# Patient Record
Sex: Female | Born: 1960 | Race: White | Hispanic: No | Marital: Married | Smoking: Never smoker
Health system: Southern US, Community
[De-identification: ages and names within clinical notes are randomized; demographics above are authoritative.]

## PROBLEM LIST (undated history)

## (undated) DIAGNOSIS — G4730 Sleep apnea, unspecified: Secondary | ICD-10-CM

## (undated) DIAGNOSIS — T7840XA Allergy, unspecified, initial encounter: Secondary | ICD-10-CM

## (undated) DIAGNOSIS — F32A Depression, unspecified: Secondary | ICD-10-CM

## (undated) DIAGNOSIS — K589 Irritable bowel syndrome without diarrhea: Secondary | ICD-10-CM

## (undated) DIAGNOSIS — E669 Obesity, unspecified: Secondary | ICD-10-CM

## (undated) DIAGNOSIS — F419 Anxiety disorder, unspecified: Secondary | ICD-10-CM

## (undated) DIAGNOSIS — K219 Gastro-esophageal reflux disease without esophagitis: Secondary | ICD-10-CM

## (undated) HISTORY — DX: Gastro-esophageal reflux disease without esophagitis: K21.9

## (undated) HISTORY — DX: Irritable bowel syndrome, unspecified: K58.9

## (undated) HISTORY — DX: Allergy, unspecified, initial encounter: T78.40XA

## (undated) HISTORY — PX: ABDOMINAL HYSTERECTOMY: SHX81

## (undated) HISTORY — DX: Obesity, unspecified: E66.9

---

## 1990-07-19 HISTORY — PX: BACK SURGERY: SHX140

## 2011-05-08 ENCOUNTER — Emergency Department: Payer: Self-pay | Admitting: *Deleted

## 2011-05-08 DIAGNOSIS — Z87442 Personal history of urinary calculi: Secondary | ICD-10-CM | POA: Insufficient documentation

## 2012-04-27 ENCOUNTER — Ambulatory Visit: Payer: Self-pay | Admitting: Orthopedic Surgery

## 2012-05-09 ENCOUNTER — Ambulatory Visit: Payer: Self-pay | Admitting: Anesthesiology

## 2012-05-16 ENCOUNTER — Ambulatory Visit: Payer: Self-pay

## 2012-05-17 ENCOUNTER — Encounter: Payer: Self-pay | Admitting: Anesthesiology

## 2012-05-19 ENCOUNTER — Encounter: Payer: Self-pay | Admitting: Anesthesiology

## 2012-05-29 ENCOUNTER — Ambulatory Visit: Payer: Self-pay | Admitting: Anesthesiology

## 2012-06-18 ENCOUNTER — Encounter: Payer: Self-pay | Admitting: Anesthesiology

## 2012-07-03 ENCOUNTER — Ambulatory Visit: Payer: Self-pay | Admitting: Anesthesiology

## 2014-10-28 ENCOUNTER — Ambulatory Visit
Admit: 2014-10-28 | Disposition: A | Discharge status: Home / Self Care | Payer: Self-pay | Attending: Family Medicine | Admitting: Family Medicine

## 2014-11-04 ENCOUNTER — Ambulatory Visit (INDEPENDENT_AMBULATORY_CARE_PROVIDER_SITE_OTHER): Payer: BC Managed Care – PPO

## 2014-11-04 ENCOUNTER — Ambulatory Visit (INDEPENDENT_AMBULATORY_CARE_PROVIDER_SITE_OTHER): Payer: BC Managed Care – PPO | Admitting: Podiatry

## 2014-11-04 ENCOUNTER — Encounter: Payer: Self-pay | Admitting: Podiatry

## 2014-11-04 VITALS — BP 150/92 | HR 82 | Resp 16 | Ht 70.0 in | Wt 195.0 lb

## 2014-11-04 DIAGNOSIS — M779 Enthesopathy, unspecified: Secondary | ICD-10-CM

## 2014-11-04 DIAGNOSIS — M778 Other enthesopathies, not elsewhere classified: Secondary | ICD-10-CM

## 2014-11-04 DIAGNOSIS — M79674 Pain in right toe(s): Secondary | ICD-10-CM | POA: Diagnosis not present

## 2014-11-04 DIAGNOSIS — M7751 Other enthesopathy of right foot: Secondary | ICD-10-CM

## 2014-11-04 NOTE — Progress Notes (Signed)
   Subjective:    Patient ID: Wendee CoppAngela Dutter, female    DOB: 12/22/1960, 54 y.o.   MRN: [id number]  HPI  RIGHT FOOT PAIN , THE BALL OF FOOT AND THE TWO TOES, SECOND AND THIRD TOE, IT THROBS , IT HAS BEEN ABOUT 6 MONTHS   Review of Systems  All other systems reviewed and are negative.      Objective:   Physical Exam: I have reviewed her past medical history medications allergies surgery social history and review of systems. His are palpable bilateral. Neurologic and muscle strength are normal bilateral. Deep tendon reflexes are bilaterally equal. Orthopedic evaluation demonstrates pain on palpation and in range of motion of the second and third metatarsophalangeal joints of the right foot with mild hammertoe deformity third right. Radiographs confirm no major osseous abnormalities in this area.        Assessment & Plan:  Assessment: Capsulitis second and third metatarsophalangeal joints right foot.  Plan: Injected para-articular Kenalog and local anesthetic second and third metatarsophalangeal joints right foot. Discussed appropriate shoe gear stretching exercises ice therapy and shoe modifications.

## 2014-12-20 ENCOUNTER — Other Ambulatory Visit: Payer: Self-pay

## 2014-12-20 DIAGNOSIS — E663 Overweight: Secondary | ICD-10-CM | POA: Insufficient documentation

## 2014-12-20 DIAGNOSIS — K219 Gastro-esophageal reflux disease without esophagitis: Secondary | ICD-10-CM | POA: Insufficient documentation

## 2014-12-20 DIAGNOSIS — N951 Menopausal and female climacteric states: Secondary | ICD-10-CM | POA: Insufficient documentation

## 2014-12-20 DIAGNOSIS — E669 Obesity, unspecified: Secondary | ICD-10-CM | POA: Insufficient documentation

## 2014-12-20 MED ORDER — VENLAFAXINE HCL ER 150 MG PO TB24: 1.0000 | ORAL_TABLET | Freq: Every day | ORAL | Status: DC

## 2015-01-02 ENCOUNTER — Other Ambulatory Visit: Payer: Self-pay

## 2015-01-02 NOTE — Telephone Encounter (Signed)
Called [facility] and patient had refill in system.

## 2015-04-11 ENCOUNTER — Encounter: Payer: Self-pay | Admitting: Family Medicine

## 2015-04-17 ENCOUNTER — Other Ambulatory Visit: Payer: Self-pay | Admitting: Family Medicine

## 2015-04-19 ENCOUNTER — Other Ambulatory Visit: Payer: Self-pay | Admitting: Family Medicine

## 2015-04-21 ENCOUNTER — Telehealth: Payer: Self-pay | Admitting: Family Medicine

## 2015-04-21 DIAGNOSIS — N951 Menopausal and female climacteric states: Secondary | ICD-10-CM

## 2015-04-21 MED ORDER — VENLAFAXINE HCL ER 150 MG PO TB24: 1.0000 | ORAL_TABLET | Freq: Every day | ORAL | Status: DC

## 2015-04-21 NOTE — Telephone Encounter (Signed)
Routed to Dr. Shah for approval 

## 2015-04-21 NOTE — Telephone Encounter (Signed)
Patient has scheduled appointment for 04-23-15 and would like to know if you could give her just enough of effexor to last until appointment time. Please send to [facility]

## 2015-04-21 NOTE — Telephone Encounter (Signed)
Prescription for Effexor for 30 days is sent to patient's pharmacy

## 2015-04-22 ENCOUNTER — Other Ambulatory Visit: Payer: Self-pay | Admitting: Family Medicine

## 2015-04-22 DIAGNOSIS — Z1231 Encounter for screening mammogram for malignant neoplasm of breast: Secondary | ICD-10-CM

## 2015-04-22 NOTE — Telephone Encounter (Signed)
A 30 - day supply of Effexor has been sent to pharmacy per Dr. Sherryll Burger

## 2015-04-23 ENCOUNTER — Ambulatory Visit (INDEPENDENT_AMBULATORY_CARE_PROVIDER_SITE_OTHER): Payer: BC Managed Care – PPO | Admitting: Family Medicine

## 2015-04-23 ENCOUNTER — Encounter: Payer: Self-pay | Admitting: Family Medicine

## 2015-04-23 ENCOUNTER — Ambulatory Visit
Admission: RE | Admit: 2015-04-23 | Discharge: 2015-04-23 | Disposition: A | Discharge status: Home / Self Care | Payer: BC Managed Care – PPO | Source: Ambulatory Visit | Attending: Family Medicine | Admitting: Family Medicine

## 2015-04-23 VITALS — BP 120/78 | HR 88 | Temp 98.1°F | Resp 18 | Ht 68.0 in | Wt 244.1 lb

## 2015-04-23 DIAGNOSIS — Z1231 Encounter for screening mammogram for malignant neoplasm of breast: Secondary | ICD-10-CM | POA: Insufficient documentation

## 2015-04-23 DIAGNOSIS — N951 Menopausal and female climacteric states: Secondary | ICD-10-CM

## 2015-04-23 MED ORDER — VENLAFAXINE HCL ER 37.5 MG PO TB24: 1.0000 | ORAL_TABLET | Freq: Every day | ORAL | Status: DC

## 2015-04-23 MED ORDER — VENLAFAXINE HCL ER 75 MG PO TB24: 1.0000 | ORAL_TABLET | Freq: Every day | ORAL | Status: DC

## 2015-04-23 NOTE — Progress Notes (Signed)
Name: Ashley Hale   MRN: [id number]    DOB: 02-12-1961   Date:04/23/2015       Progress Note  Subjective  Chief Complaint  Chief Complaint  Patient presents with  . Medication Refill    venlafaxine  / furosemide     HPI  Hot Flashes  Pt. is here for follow up of hot flashes and menopausal symptoms, including night sweats. She is on Effexor XR 150 mg daily. Initially had good symptom control on Effexor but is not working any more. Hot flashes and night sweats have recurred. 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 She wishes to stop Effexor and start on a different therapy.   History reviewed. No pertinent past medical history.  Past Surgical History  Procedure Laterality Date  . Abdominal hysterectomy      History reviewed. No pertinent family history.  Social History   Social History  . Marital Status: Married    Spouse Name: N/A  . Number of Children: N/A  . Years of Education: N/A   Occupational History  . Not on file.   Social History Main Topics  . Smoking status: Never Smoker   . Smokeless tobacco: Not on file  . Alcohol Use: Not on file  . Drug Use: Not on file  . Sexual Activity: Not on file   Other Topics Concern  . Not on file   Social History Narrative    Current outpatient prescriptions:  .  furosemide (LASIX) 40 MG tablet, Take 1 tablet by mouth daily., Disp: , Rfl:  .  Omeprazole 20 MG TBEC, Take 1 capsule by mouth daily., Disp: , Rfl:  .  Venlafaxine HCl 150 MG TB24, Take 1 tablet (150 mg total) by mouth daily., Disp: 30 each, Rfl: 0  Allergies  Allergen Reactions  . Sulfa Antibiotics Hives    STOMACH CRAMPS   Review of Systems  Constitutional: Negative for fever, chills and malaise/fatigue.  Psychiatric/Behavioral: Negative for depression. The patient is not nervous/anxious and does not have insomnia.    Objective  Filed Vitals:   04/23/15 1532  BP: 120/78  Pulse: 88  Temp: 98.1 F (36.7 C)   TempSrc: Oral  Resp: 18  Height:  (1.727 m)  Weight: 244 lb 1.6 oz (110.723 kg)  SpO2: 98%   Physical Exam  Constitutional: She is well-developed, well-nourished, and in no distress.  Cardiovascular: Normal rate and regular rhythm.   Pulmonary/Chest: Effort normal and breath sounds normal.  Nursing note and vitals reviewed.  Assessment & Plan  1. Hot flash, menopausal DC venlafaxine via  Low taper over 2 weeks. Return to discuss alternative therapy for hot flashes. - Venlafaxine HCl 75 MG TB24; Take 1 tablet (75 mg total) by mouth daily.  Dispense: 7 each; Refill: 0 - Venlafaxine HCl 37.5 MG TB24; Take 1 tablet (37.5 mg total) by mouth daily.  Dispense: 7 tablet; Refill: 0   Irais Mottram Asad [facility] 04/23/2015 4:22 PM

## 2015-05-05 ENCOUNTER — Encounter: Payer: Self-pay | Admitting: Family Medicine

## 2015-05-05 ENCOUNTER — Ambulatory Visit (INDEPENDENT_AMBULATORY_CARE_PROVIDER_SITE_OTHER): Payer: BC Managed Care – PPO | Admitting: Family Medicine

## 2015-05-05 VITALS — BP 136/80 | HR 86 | Temp 98.5°F | Resp 18 | Ht 68.0 in | Wt 246.1 lb

## 2015-05-05 DIAGNOSIS — E669 Obesity, unspecified: Secondary | ICD-10-CM

## 2015-05-05 DIAGNOSIS — N951 Menopausal and female climacteric states: Secondary | ICD-10-CM

## 2015-05-05 MED ORDER — GABAPENTIN 300 MG PO CAPS: 300.0000 mg | ORAL_CAPSULE | Freq: Three times a day (TID) | ORAL | Status: DC

## 2015-05-05 MED ORDER — LORCASERIN HCL 10 MG PO TABS: 1.0000 | ORAL_TABLET | Freq: Two times a day (BID) | ORAL | Status: DC

## 2015-05-05 NOTE — Progress Notes (Signed)
Name: Ashley Hale   MRN: [id number]    DOB: 09/21/1960   Date:05/05/2015       Progress Note  Subjective  Chief Complaint  Chief Complaint  Patient presents with  . Follow-up    2 wk    HPI  Pt. Is here to be started on therapy for Hot Flashes and night sweats due to menopause. She was on Venlafaxine 150 mg once daily which was not working for her symptoms. She has tapered off Venlafaxine.   In addition, patient is requesting pharmacotherapy for obesity. Current BMI is 37.42 and she weighs 246 pounds today.she will be started on Belviq 10 MG twice a day.  History reviewed. No pertinent past medical history.  Past Surgical History  Procedure Laterality Date  . Abdominal hysterectomy      History reviewed. No pertinent family history.  Social History   Social History  . Marital Status: Married    Spouse Name: N/A  . Number of Children: N/A  . Years of Education: N/A   Occupational History  . Not on file.   Social History Main Topics  . Smoking status: Never Smoker   . Smokeless tobacco: Not on file  . Alcohol Use: Not on file  . Drug Use: Not on file  . Sexual Activity: Not on file   Other Topics Concern  . Not on file   Social History Narrative    Current outpatient prescriptions:  .  furosemide (LASIX) 40 MG tablet, Take 1 tablet by mouth daily., Disp: , Rfl:  .  Omeprazole 20 MG TBEC, Take 1 capsule by mouth daily., Disp: , Rfl:  .  Venlafaxine HCl 37.5 MG TB24, Take 1 tablet (37.5 mg total) by mouth daily., Disp: 7 tablet, Rfl: 0 .  Venlafaxine HCl 75 MG TB24, Take 1 tablet (75 mg total) by mouth daily., Disp: 7 each, Rfl: 0  Allergies  Allergen Reactions  . Sulfa Antibiotics Hives    STOMACH CRAMPS   Review of Systems  Constitutional: Positive for malaise/fatigue. Negative for fever, chills and weight loss.  Respiratory: Negative for cough and shortness of breath.   Cardiovascular: Negative for chest pain.  Psychiatric/Behavioral: Positive for  depression. The patient is not nervous/anxious.    Objective  Filed Vitals:   05/05/15 1537  BP: 136/80  Pulse: 86  Temp: 98.5 F (36.9 C)  TempSrc: Oral  Resp: 18  Height: 5\' 8"  (1.727 m)  Weight: 246 lb 1.6 oz (111.63 kg)  SpO2: 97%   Physical Exam  Constitutional: She is oriented to person, place, and time and well-developed, well-nourished, and in no distress.  HENT:  Head: Normocephalic and atraumatic.  Cardiovascular: Normal rate and regular rhythm.   No murmur heard. Pulmonary/Chest: Effort normal and breath sounds normal.  Neurological: She is alert and oriented to person, place, and time.  Psychiatric: Mood, memory, affect and judgment normal.  Nursing note and vitals reviewed.  Assessment & Plan  1. Hot flash, menopausal  Started on gabapentin 300 mg 1 tablet every night and reassess in one month. If therapy with gabapentin fails, we will consider HRT.  - gabapentin (NEURONTIN) 300 MG capsule; Take 1 capsule (300 mg total) by mouth 3 (three) times daily.  Dispense: 90 capsule; Refill: 0  2. Adiposity  Started on Belviq 10 mg twice a day. Encouraged to continue with dietary and lifestyle therapies in parallel with pharmacotherapy.Educated on medication side effects. Reassess in one month.  - Lorcaserin HCl (BELVIQ) 10 MG TABS; Take  1 tablet by mouth 2 (two) times daily.  Dispense: 60 tablet; Refill: 0   Cleatus Gabriel [facility] 05/05/2015 4:02 PM

## 2015-05-07 ENCOUNTER — Ambulatory Visit (INDEPENDENT_AMBULATORY_CARE_PROVIDER_SITE_OTHER): Payer: BC Managed Care – PPO | Admitting: Podiatry

## 2015-05-07 ENCOUNTER — Ambulatory Visit (INDEPENDENT_AMBULATORY_CARE_PROVIDER_SITE_OTHER): Payer: BC Managed Care – PPO

## 2015-05-07 ENCOUNTER — Encounter: Payer: Self-pay | Admitting: Podiatry

## 2015-05-07 VITALS — BP 108/72 | HR 111 | Resp 18

## 2015-05-07 DIAGNOSIS — M778 Other enthesopathies, not elsewhere classified: Secondary | ICD-10-CM

## 2015-05-07 DIAGNOSIS — R52 Pain, unspecified: Secondary | ICD-10-CM

## 2015-05-07 DIAGNOSIS — M779 Enthesopathy, unspecified: Secondary | ICD-10-CM

## 2015-05-07 DIAGNOSIS — M7751 Other enthesopathy of right foot: Secondary | ICD-10-CM

## 2015-05-07 NOTE — Progress Notes (Signed)
She presents today states that she fell and bruised her second digit right foot.  Objective: Vital signs are stable she is alert and oriented 3. Pulses are palpable. She has pain on palpation second metatarsophalangeal joint and in range of motion of the second metatarsophalangeal joint right. Radiograph does not demonstrate type of osseus abnormality.  Assessment: Well healing capsulitis second metatarsophalangeal joint right foot.  Plan: Injected the area today of dexamethasone and local anesthetic follow-up with me in the near future for surgical intervention. She states that she is ready to have this done.

## 2015-05-08 ENCOUNTER — Telehealth: Payer: Self-pay | Admitting: Internal Medicine

## 2015-05-08 ENCOUNTER — Telehealth: Payer: Self-pay | Admitting: Family Medicine

## 2015-05-08 DIAGNOSIS — N951 Menopausal and female climacteric states: Secondary | ICD-10-CM

## 2015-05-08 MED ORDER — PAROXETINE MESYLATE 7.5 MG PO CAPS: 1.0000 | ORAL_CAPSULE | Freq: Every day | ORAL | Status: DC

## 2015-05-08 NOTE — Telephone Encounter (Signed)
ERRENOUS °

## 2015-05-08 NOTE — Telephone Encounter (Signed)
Pt states she has been very sick since being put on the Gabepentin. She has had no energy and has been vomiting,diarrhea,dizzy. She states she has taken herself off of it because she just cannot take it anymore. She also states she is very emotional and Dr Sherryll BurgerShah told her to call if she needs something for anxiety. She states she needs it really bad coming off of the other meds.

## 2015-05-08 NOTE — Telephone Encounter (Signed)
Routed to Dr. Shah for advice  

## 2015-05-08 NOTE — Telephone Encounter (Signed)
Discussed symptoms with patient Ashley Hale DC gabapentin and start patient on paroxetine 7.5 mg daily at bedtime. Patient advised to hold off on Belviq as it may have some interaction with paroxetine. Advised on possible adverse effects. Prescription for 30 days sent to pharmacy. Patient Ashley Hale follow-up in one month..Marland Kitchen

## 2015-05-09 NOTE — Telephone Encounter (Signed)
Dr. Sherryll BurgerShah has spoken with patient and she will follow up in 1 month

## 2015-05-12 ENCOUNTER — Telehealth: Payer: Self-pay | Admitting: Family Medicine

## 2015-05-12 DIAGNOSIS — N951 Menopausal and female climacteric states: Secondary | ICD-10-CM

## 2015-05-12 MED ORDER — PAROXETINE HCL 10 MG PO TABS: 10.0000 mg | ORAL_TABLET | Freq: Every day | ORAL | Status: DC

## 2015-05-12 NOTE — Telephone Encounter (Signed)
DC Paxil 7.5 mg and start on 10 mg daily. Follow-up in one month.

## 2015-05-12 NOTE — Telephone Encounter (Signed)
Was prescribed paxail 7.5mg  was $65 but is able to get the generic 10mg  for $10 but [facility]

## 2015-06-04 ENCOUNTER — Telehealth: Payer: Self-pay | Admitting: Family Medicine

## 2015-06-04 DIAGNOSIS — N951 Menopausal and female climacteric states: Secondary | ICD-10-CM

## 2015-06-04 MED ORDER — PAROXETINE HCL 10 MG PO TABS: 10.0000 mg | ORAL_TABLET | Freq: Every day | ORAL | Status: DC

## 2015-06-04 NOTE — Telephone Encounter (Signed)
Routed to Dr. Sherryll BurgerShah for Medication advice

## 2015-06-04 NOTE — Telephone Encounter (Signed)
Requesting refill on [facility]il [facility] States that it is working well: no hot flashes and no vomitting.

## 2015-06-04 NOTE — Telephone Encounter (Signed)
Rx has been sent to pharmacy

## 2015-06-05 ENCOUNTER — Ambulatory Visit: Payer: BC Managed Care – PPO | Admitting: Family Medicine

## 2015-06-18 ENCOUNTER — Ambulatory Visit: Payer: BC Managed Care – PPO | Admitting: Podiatry

## 2015-06-30 ENCOUNTER — Encounter: Payer: Self-pay | Admitting: Podiatry

## 2015-06-30 ENCOUNTER — Ambulatory Visit (INDEPENDENT_AMBULATORY_CARE_PROVIDER_SITE_OTHER): Payer: BC Managed Care – PPO | Admitting: Podiatry

## 2015-06-30 VITALS — BP 119/72 | HR 77 | Resp 16

## 2015-06-30 DIAGNOSIS — M2041 Other hammer toe(s) (acquired), right foot: Secondary | ICD-10-CM | POA: Diagnosis not present

## 2015-06-30 DIAGNOSIS — M7751 Other enthesopathy of right foot: Secondary | ICD-10-CM

## 2015-06-30 DIAGNOSIS — M778 Other enthesopathies, not elsewhere classified: Secondary | ICD-10-CM

## 2015-06-30 DIAGNOSIS — M779 Enthesopathy, unspecified: Principal | ICD-10-CM

## 2015-06-30 DIAGNOSIS — M2011 Hallux valgus (acquired), right foot: Secondary | ICD-10-CM

## 2015-06-30 NOTE — Progress Notes (Signed)
She presents today for follow-up of her capsulitis second metatarsophalangeal joint of the right foot. She states that is doing much better however she is concerned about the changes in the PIPJ and the deviation of the second toe and third toe right foot.  Objective: Vital signs are stable she is alert and oriented 3. Pulses are palpable. Hallux rigidus first metatarsophalangeal joint resulted in capsulitis which is resolving to the right foot. She has much decrease in pain and swelling on palpation and range of motion of the second metatarsophalangeal joint right foot. Pulses are strongly palpable. Adductovarus rotated fourth toe right foot and a flexible hammertoe deformity right foot.  Assessment: Hallux rigidus first metatarsophalangeal joint right. Elongated second metatarsal with hammertoe deformity second right. Hammertoe deformity third digit right foot under lapping the second.  Plan: Discussed etiology pathology conservative versus surgical therapies. At this point she's calling to decide when she can take off work she will notify us in January at which time we'll try to have a day nail down for surgical consideration.

## 2015-07-09 ENCOUNTER — Other Ambulatory Visit: Payer: Self-pay | Admitting: Family Medicine

## 2015-07-09 ENCOUNTER — Telehealth: Payer: Self-pay | Admitting: Internal Medicine

## 2015-07-09 DIAGNOSIS — N951 Menopausal and female climacteric states: Secondary | ICD-10-CM

## 2015-07-09 MED ORDER — PAROXETINE HCL 10 MG PO TABS: 10.0000 mg | ORAL_TABLET | Freq: Every day | ORAL | Status: DC

## 2015-07-09 NOTE — Telephone Encounter (Signed)
PT IS OUT OF HER MEDICATION FOR HOT FLASHES ( PAROXETINE 10MG )  AND IF SHE DOES NOT TAKE HER MEDICATION SHE STARTS TO THROW UP LIKE SOMEONE PREG. THIS IS ASHLEY'S MOTHER THAT USE TO BE [facility].Marland Kitchen. PHARM IS [facility] [address]

## 2015-07-09 NOTE — Telephone Encounter (Signed)
Medication has been refilled and sent to [facility] 

## 2015-07-09 NOTE — Telephone Encounter (Signed)
Since Dr. Sherryll BurgerShah is out of the office, refill request was sent to Dr. Edwena FeltyAshany Sundaram for approval and submission.  This medication was last filled on 06/04/15.

## 2015-07-17 NOTE — Telephone Encounter (Signed)
ERRENOUS °

## 2015-08-08 ENCOUNTER — Other Ambulatory Visit: Payer: Self-pay | Admitting: Family Medicine

## 2015-08-11 ENCOUNTER — Other Ambulatory Visit: Payer: Self-pay | Admitting: Family Medicine

## 2015-08-11 ENCOUNTER — Ambulatory Visit (INDEPENDENT_AMBULATORY_CARE_PROVIDER_SITE_OTHER): Payer: BC Managed Care – PPO | Admitting: Podiatry

## 2015-08-11 ENCOUNTER — Encounter: Payer: Self-pay | Admitting: Podiatry

## 2015-08-11 VITALS — BP 129/74 | HR 71 | Resp 16

## 2015-08-11 DIAGNOSIS — M2041 Other hammer toe(s) (acquired), right foot: Secondary | ICD-10-CM | POA: Diagnosis not present

## 2015-08-11 DIAGNOSIS — M2011 Hallux valgus (acquired), right foot: Secondary | ICD-10-CM

## 2015-08-11 DIAGNOSIS — M205X1 Other deformities of toe(s) (acquired), right foot: Secondary | ICD-10-CM

## 2015-08-11 NOTE — Patient Instructions (Signed)
Pre-Operative Instructions  Congratulations, you have decided to take an important step to improving your quality of life.  You can be assured that the doctors of [facility] will be with you every step of the way.  1. Plan to be at the surgery center/hospital at least 1 (one) hour prior to your scheduled time unless otherwise directed by the surgical center/hospital staff.  You must have a responsible adult accompany you, remain during the surgery and drive you home.  Make sure you have directions to the surgical center/hospital and know how to get there on time. 2. For hospital based surgery you will need to obtain a history and physical form from your family physician within 1 month prior to the date of surgery- we will give you a form for you primary physician.  3. We make every effort to accommodate the date you request for surgery.  There are however, times where surgery dates or times have to be moved.  We will contact you as soon as possible if a change in schedule is required.   4. No Aspirin/Ibuprofen for one week before surgery.  If you are on aspirin, any non-steroidal anti-inflammatory medications (Mobic, Aleve, Ibuprofen) you should stop taking it 7 days prior to your surgery.  You make take Tylenol  For pain prior to surgery.  5. Medications- If you are taking daily heart and blood pressure medications, seizure, reflux, allergy, asthma, anxiety, pain or diabetes medications, make sure the surgery center/hospital is aware before the day of surgery so they may notify you which medications to take or avoid the day of surgery. 6. No food or drink after midnight the night before surgery unless directed otherwise by surgical center/hospital staff. 7. No alcoholic beverages 24 hours prior to surgery.  No smoking 24 hours prior to or 24 hours after surgery. 8. Wear loose pants or shorts- loose enough to fit over bandages, boots, and casts. 9. No slip on shoes, sneakers are best. 10. Bring  your boot with you to the surgery center/hospital.  Also bring crutches or a walker if your physician has prescribed it for you.  If you do not have this equipment, it will be provided for you after surgery. 11. If you have not been contracted by the surgery center/hospital by the day before your surgery, call to confirm the date and time of your surgery. 12. Leave-time from work may vary depending on the type of surgery you have.  Appropriate arrangements should be made prior to surgery with your employer. 13. Prescriptions will be provided immediately following surgery by your doctor.  Have these filled as soon as possible after surgery and take the medication as directed. 14. Remove nail polish on the operative foot. 15. Wash the night before surgery.  The night before surgery wash the foot and leg well with the antibacterial soap provided and water paying special attention to beneath the toenails and in between the toes.  Rinse thoroughly with water and dry well with a towel.  Perform this wash unless told not to do so by your physician.  Enclosed: 1 Ice pack (please put in freezer the night before surgery)   1 Hibiclens skin cleaner   Pre-op Instructions  If you have any questions regarding the instructions, do not hesitate to call our office.  [place]: [address] [place], [place] [address] [phone]  [place]: [address] [place], [place] [address] [phone]  [place]: [address]  [place], [place] [address] [phone]  Dr. Richard   Tuchman DPM, Dr. Norman Regal DPM Dr. Richard Sikora DPM, Dr. M. Todd Hyatt DPM, Dr. Kathryn Egerton DPM 

## 2015-08-11 NOTE — Progress Notes (Signed)
She presents with her husband today for surgical consult regarding her right foot. She states that this thing continues to get worse and I can just hardly stand it anymore. She states she is a very active individual and this is limiting her activities. She denies changes in her past medical history medications allergies social history and surgical history.  Objective: Vital signs are stable she is alert and oriented 3. Pulses are strongly palpable bilateral. Neurologic sensorium is intact per Semmes-Weinstein monofilament. Deep tendon reflexes are intact bilateral muscle strength +5 over 5 dorsiflexion plantar flexors and inverters everters all of his musculature is intact. She has limitation on range of motion of the first metatarsophalangeal joint of the right foot as well as the second metatarsophalangeal joint of the right foot. Contracted rigid hammertoe deformities are noted second and third digits of the right foot. Moderate severe pain on palpation second metatarsophalangeal joint right.  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 Capsulitis second metatarsophalangeal joint right foot. Hammertoe deformities #2 and #3 of the right foot.  Plan: Discussed etiology pathology conservative versus surgical therapies. At this point consented her today for a Keller arthroplasty with a single silicone implant right first metatarsophalangeal joint. A second metatarsal osteotomy with double screw fixation. Hammertoe repair; arthrodesis PIPJ second and third digits right foot. We went over the consent form today line by line number by number giving her ample time to ask questions she saw fit regarding these procedures. I discussed them in great detail with her and her husband today. We discussed the possible postop complications which may include but are not limited to postop pain bleeding swelling infection recurrence need for further surgery overcorrection under correction loss of  digit loss of limb loss of life. We dispensed a cam walker as well as perioperative instructions and I will follow-up with her in the near future.

## 2015-08-12 ENCOUNTER — Telehealth: Payer: Self-pay | Admitting: *Deleted

## 2015-08-12 NOTE — Telephone Encounter (Signed)
"  I see where you returned my call.  I'd like to schedule surgery."  His first available isn't until 09/19/2015.  "I was hoping for February 10th."  Dr. Al Corpus is not doing surgery on that day.  "Okay, put me down for March 3, my daughter is scheduled for a c-section on the 13th.  I wanted to have it done before then."  I'll get it scheduled.  Surgical center will call you a day or two prior to with the arrival time.  Go ahead and register with the surgical center, instructions are in the brochure that you were given.

## 2015-08-12 NOTE — Telephone Encounter (Signed)
"  I'm calling to schedule surgery with Dr. Al Corpus.  I just left his office.  Thank you."  I attempted to call patient.  I left her a message to call me back.

## 2015-08-13 ENCOUNTER — Other Ambulatory Visit: Payer: Self-pay | Admitting: Family Medicine

## 2015-08-14 ENCOUNTER — Telehealth: Payer: Self-pay | Admitting: Family Medicine

## 2015-08-14 NOTE — Telephone Encounter (Signed)
Pt is totally out of her hot flash medication. Husband said that the pharm sent a request over on Monday. Please refill she is in bad shape per husband.

## 2015-08-15 ENCOUNTER — Encounter: Payer: Self-pay | Admitting: Family Medicine

## 2015-08-15 ENCOUNTER — Ambulatory Visit (INDEPENDENT_AMBULATORY_CARE_PROVIDER_SITE_OTHER): Payer: BC Managed Care – PPO | Admitting: Family Medicine

## 2015-08-15 VITALS — BP 120/82 | HR 79 | Temp 98.0°F | Resp 16 | Ht 70.0 in | Wt 244.8 lb

## 2015-08-15 DIAGNOSIS — Z1211 Encounter for screening for malignant neoplasm of colon: Secondary | ICD-10-CM | POA: Diagnosis not present

## 2015-08-15 DIAGNOSIS — R635 Abnormal weight gain: Secondary | ICD-10-CM

## 2015-08-15 DIAGNOSIS — R5382 Chronic fatigue, unspecified: Secondary | ICD-10-CM

## 2015-08-15 DIAGNOSIS — Z8249 Family history of ischemic heart disease and other diseases of the circulatory system: Secondary | ICD-10-CM | POA: Diagnosis not present

## 2015-08-15 DIAGNOSIS — Z7189 Other specified counseling: Secondary | ICD-10-CM | POA: Diagnosis not present

## 2015-08-15 DIAGNOSIS — N951 Menopausal and female climacteric states: Secondary | ICD-10-CM | POA: Diagnosis not present

## 2015-08-15 DIAGNOSIS — E669 Obesity, unspecified: Secondary | ICD-10-CM

## 2015-08-15 DIAGNOSIS — K219 Gastro-esophageal reflux disease without esophagitis: Secondary | ICD-10-CM

## 2015-08-15 DIAGNOSIS — Z7689 Persons encountering health services in other specified circumstances: Secondary | ICD-10-CM

## 2015-08-15 MED ORDER — PAROXETINE HCL 10 MG PO TABS: 10.0000 mg | ORAL_TABLET | Freq: Every day | ORAL | Status: DC

## 2015-08-15 MED ORDER — RANITIDINE HCL 150 MG PO CAPS: 150.0000 mg | ORAL_CAPSULE | Freq: Two times a day (BID) | ORAL | Status: DC

## 2015-08-15 NOTE — Telephone Encounter (Signed)
Medication has been filled 

## 2015-08-15 NOTE — Assessment & Plan Note (Signed)
Renew Paxil to help with symptoms. Check FSH/LH to confirm post menopause state since patient does not have a uterus.  Consider HRT if fails paxils given severe vasomotor symptoms.

## 2015-08-15 NOTE — Patient Instructions (Signed)
Let's check a few labs to figure out if there is anything causing your fatigue and weight gain.    For your acid reflux- lets add ranitidine twice daily to help with your symptoms. Try taking your omeprazole at night.

## 2015-08-15 NOTE — Assessment & Plan Note (Signed)
Pt has had 50lb weight gain in past few years. She is trying to lose weight. Has been trying to make diet changes- but had life stressors in the past.  Discussed checking labwork and options for weight loss.

## 2015-08-15 NOTE — Assessment & Plan Note (Signed)
Symptoms uncontrolled. Change PPI to qhs. Add H2 blocker. Check CBC. Recheck 1 mos.  Consider GI referral if not improving.

## 2015-08-15 NOTE — Progress Notes (Signed)
Subjective:    Patient ID: Ashley Hale, female    DOB: [birth year], 55 y.o.   MRN: [id number]  HPI: Ashley Hale is a 55 y.o. female presenting on 08/15/2015 for Establish Care   HPI  Pt presents to establish care today. Previous care provider was Dr. Sherryll Burger at [facility]  It has been October 2016 months since Her last PCP visit. Records from previous provider will be requested and reviewed. Current medical problems include:  Hot flashes: Hot flashes started a few years ago- throws up when she has them.  Has been paroxetine- since October- has been helpful.  Reduces hot flashes Weight gain: Since menopause started. Gained 50lbs. Having trouble carrying the weight around.  Fatigue- started with hot flashes.  2nd and 3rd tow capilitis- surgery March 3. Scheduled with podiatry.  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 Last Mammo- Dec 2016- Normal.  Hysterectomy: Uterine prolapse- 1998.  No paps needed.  TDAP: Has been about 6 year. Unsure if it had a TDAP booster.     Past Medical History  Diagnosis Date  . Obesity   . GERD (gastroesophageal reflux disease)   . IBS (irritable bowel syndrome)    Social History   Social History  . Marital Status: Married    Spouse Name: N/A  . Number of Children: N/A  . Years of Education: N/A   Occupational History  . Not on file.   Social History Main Topics  . Smoking status: Never Smoker   . Smokeless tobacco: Not on file  . Alcohol Use: No  . Drug Use: No  . Sexual Activity: Not on file   Other Topics Concern  . Not on file   Social History Narrative   Family History  Problem Relation Age of Onset  . Heart disease Mother   . Hypertension Mother   . Hyperlipidemia Mother   . Heart disease Father    Current Outpatient Prescriptions on File Prior to Visit  Medication Sig  . furosemide (LASIX) 40 MG tablet Take 1 tablet by mouth daily.  . Omeprazole 20 MG TBEC Take 1 capsule by mouth  daily.   No current facility-administered medications on file prior to visit.    Review of Systems  Constitutional: Positive for fatigue and unexpected weight change. Negative for fever and chills.  HENT: Negative.   Respiratory: Negative for cough, chest tightness and wheezing.   Cardiovascular: Negative for chest pain and leg swelling.  Gastrointestinal: Negative for nausea, vomiting, abdominal pain, diarrhea and constipation.  Endocrine: Positive for heat intolerance. Negative for cold intolerance, polydipsia, polyphagia and polyuria.  Genitourinary: Negative for dysuria and difficulty urinating.  Musculoskeletal: Positive for arthralgias (knees.).  Skin: Negative for rash.  Neurological: Negative for dizziness, light-headedness and numbness.  Psychiatric/Behavioral: Negative.    Per HPI unless specifically indicated above     Objective:    BP 120/82 mmHg  Pulse 79  Temp(Src) 98 F (36.7 C) (Oral)  Resp 16  Ht  (1.778 m)  Wt 244 lb 12.8 oz (111.041 kg)  BMI 35.13 kg/m2  Wt Readings from Last 3 Encounters:  08/15/15 244 lb 12.8 oz (111.041 kg)  05/05/15 246 lb 1.6 oz (111.63 kg)  04/23/15 244 lb 1.6 oz (110.723 kg)    Physical Exam  Constitutional: She is oriented to person, place, and time. She appears well-developed and well-nourished.  HENT:  Head: Normocephalic and atraumatic.  Neck: Neck supple.  Cardiovascular: Normal rate, regular rhythm and normal  heart sounds.  Exam reveals no gallop and no friction rub.   No murmur heard. Pulmonary/Chest: Effort normal and breath sounds normal. She has no wheezes. She exhibits no tenderness.  Abdominal: Soft. Normal appearance and bowel sounds are normal. She exhibits no distension and no mass. There is no tenderness. There is no rebound and no guarding.  Musculoskeletal: Normal range of motion. She exhibits no edema or tenderness.  Lymphadenopathy:    She has no cervical adenopathy.  Neurological: She is alert and  oriented to person, place, and time.  Skin: Skin is warm and dry.  Psychiatric: She has a normal mood and affect. Her behavior is normal. Judgment and thought content normal.   No results found for this or any previous visit.    Assessment & Plan:   Problem List Items Addressed This Visit      Digestive   Acid reflux    Symptoms uncontrolled. Change PPI to qhs. Add H2 blocker. Check CBC. Recheck 1 mos.  Consider GI referral if not improving.       Relevant Medications   ranitidine (ZANTAC) 150 MG capsule     Other   Hot flash, menopausal    Renew Paxil to help with symptoms. Check FSH/LH to confirm post menopause state since patient does not have a uterus.  Consider HRT if fails paxils given severe vasomotor symptoms.       Relevant Medications   PARoxetine (PAXIL) 10 MG tablet   Other Relevant Orders   FSH/LH   Obesity    Pt has had 50lb weight gain in past few years. She is trying to lose weight. Has been trying to make diet changes- but had life stressors in the past.  Discussed checking labwork and options for weight loss.        Other Visit Diagnoses    Encounter to establish care    -  Primary    Chronic fatigue        Check Vitamin D and TSH.     Relevant Orders    CBC with Differential    TSH    VITAMIN D 25 Hydroxy (Vit-D Deficiency, Fractures)    Abnormal weight gain        check TSH.     Relevant Orders    TSH    Family history of heart disease        Check lipid panel to stratify risk factors for heart disease.    Relevant Orders    Comprehensive Metabolic Panel (CMET)    Lipid panel    Screening for colon cancer        Cologuard ordered.    Relevant Orders    Cologuard       Meds ordered this encounter  Medications  . ranitidine (ZANTAC) 150 MG capsule    Sig: Take 1 capsule (150 mg total) by mouth 2 (two) times daily.    Dispense:  180 capsule    Refill:  3    Order Specific Question:  Supervising Provider    Answer:  Janeann Forehand  [phone]  . PARoxetine (PAXIL) 10 MG tablet    Sig: Take 1 tablet (10 mg total) by mouth daily.    Dispense:  90 tablet    Refill:  3    Order Specific Question:  Supervising Provider    Answer:  Janeann Forehand [[id number]]      Follow up plan: Return in about 4 weeks (around 09/12/2015) for Acid reflux. Marland Kitchen

## 2015-08-21 LAB — COLOGUARD: Cologuard: NEGATIVE

## 2015-09-01 ENCOUNTER — Ambulatory Visit
Admission: RE | Admit: 2015-09-01 | Discharge: 2015-09-01 | Disposition: A | Discharge status: Home / Self Care | Payer: BC Managed Care – PPO | Source: Ambulatory Visit | Attending: Family Medicine | Admitting: Family Medicine

## 2015-09-01 ENCOUNTER — Ambulatory Visit (INDEPENDENT_AMBULATORY_CARE_PROVIDER_SITE_OTHER): Payer: BC Managed Care – PPO | Admitting: Family Medicine

## 2015-09-01 VITALS — BP 133/83 | HR 77 | Temp 98.1°F | Resp 16 | Ht 70.0 in | Wt 246.0 lb

## 2015-09-01 DIAGNOSIS — R0602 Shortness of breath: Secondary | ICD-10-CM

## 2015-09-01 DIAGNOSIS — J029 Acute pharyngitis, unspecified: Secondary | ICD-10-CM

## 2015-09-01 DIAGNOSIS — J40 Bronchitis, not specified as acute or chronic: Secondary | ICD-10-CM

## 2015-09-01 DIAGNOSIS — J069 Acute upper respiratory infection, unspecified: Secondary | ICD-10-CM

## 2015-09-01 DIAGNOSIS — J0110 Acute frontal sinusitis, unspecified: Secondary | ICD-10-CM

## 2015-09-01 DIAGNOSIS — R05 Cough: Secondary | ICD-10-CM | POA: Insufficient documentation

## 2015-09-01 MED ORDER — PSEUDOEPHEDRINE HCL 60 MG PO TABS
60.0000 mg | ORAL_TABLET | Freq: Three times a day (TID) | ORAL | Status: DC | PRN
Start: 2015-09-01 — End: 2015-12-10

## 2015-09-01 MED ORDER — BENZONATATE 100 MG PO CAPS: 100.0000 mg | ORAL_CAPSULE | Freq: Three times a day (TID) | ORAL | Status: DC | PRN

## 2015-09-01 MED ORDER — AZITHROMYCIN 250 MG PO TABS: ORAL_TABLET | ORAL | Status: DC

## 2015-09-01 MED ORDER — ALBUTEROL SULFATE HFA 108 (90 BASE) MCG/ACT IN AERS: 2.0000 | INHALATION_SPRAY | Freq: Four times a day (QID) | RESPIRATORY_TRACT | Status: DC | PRN

## 2015-09-01 MED ORDER — DM-GUAIFENESIN ER 30-600 MG PO TB12: 1.0000 | ORAL_TABLET | Freq: Two times a day (BID) | ORAL | Status: DC

## 2015-09-01 NOTE — Patient Instructions (Addendum)
You can use supportive care at home to help with your symptoms. I have sent Mucinex DM to your pharmacy to help break up the congestion and soothe your cough. You can takes this twice daily.  I have also sent tesslon perles to your pharmacy to help with the cough- you can take these 3 times daily as needed. Honey is a natural cough suppressant- so add it to your tea in the morning.  If you have a humidifer, set that up in your bedroom at night.   Take your Zpak- 2 pills today and 1 pill every day until bottle is empty. Use albuterol inhaler as needed for shortness of breath.  Please seek immediate medical attention if you develop shortness of breath not relieve by inhaler, chest pain/tightness, fever > 103 F or other concerning symptoms.

## 2015-09-01 NOTE — Progress Notes (Signed)
Subjective:    Patient ID: Ashley Hale, female    DOB: 02/21/1961, 55 y.o.   MRN: [id number]  HPI: Ashley Hale is a 55 y.o. female presenting on 09/01/2015 for Laryngitis   HPI  Pt presents for congestion, cough, sore throat x1 week. Pt is reporting shortness of breath. Chest tightness. Coughing up green sputum since Wednesday and Thursday. Home treatment: Nyquil to help her sleep. 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 No fevers.  Sinus pressure. No ear pressure.    Past Medical History  Diagnosis Date  . Obesity   . GERD (gastroesophageal reflux disease)   . IBS (irritable bowel syndrome)     Current Outpatient Prescriptions on File Prior to Visit  Medication Sig  . furosemide (LASIX) 40 MG tablet Take 1 tablet by mouth daily.  . Omeprazole 20 MG TBEC Take 1 capsule by mouth daily.  Marland Kitchen PARoxetine (PAXIL) 10 MG tablet Take 1 tablet (10 mg total) by mouth daily.  . ranitidine (ZANTAC) 150 MG capsule Take 1 capsule (150 mg total) by mouth 2 (two) times daily.   No current facility-administered medications on file prior to visit.    Review of Systems  Constitutional: Negative for fever.  HENT: Positive for congestion, rhinorrhea, sinus pressure and sore throat.   Respiratory: Positive for cough and chest tightness. Negative for wheezing.   Cardiovascular: Negative for chest pain, palpitations and leg swelling.  Gastrointestinal: Negative for nausea, vomiting and diarrhea.  Musculoskeletal: Negative for neck pain and neck stiffness.  Skin: Negative for color change and rash.  Neurological: Positive for headaches.   Per HPI unless specifically indicated above     Objective:    BP 133/83 mmHg  Pulse 77  Temp(Src) 98.1 F (36.7 C) (Oral)  Resp 16  Ht  (1.778 m)  Wt 246 lb (111.585 kg)  BMI 35.30 kg/m2  SpO2 99%  Wt Readings from Last 3 Encounters:  09/01/15 246 lb (111.585 kg)  08/15/15 244 lb 12.8 oz (111.041 kg)  05/05/15 246 lb 1.6 oz (111.63 kg)    Physical Exam  Constitutional: She appears well-developed and well-nourished. No distress.  HENT:  Head: Normocephalic and atraumatic.  Right Ear: Hearing and tympanic membrane normal. Tympanic membrane is not erythematous and not bulging.  Left Ear: Hearing and tympanic membrane normal. Tympanic membrane is not erythematous and not bulging.  Nose: Mucosal edema and rhinorrhea present. No sinus tenderness or nasal septal hematoma. Right sinus exhibits no maxillary sinus tenderness and no frontal sinus tenderness. Left sinus exhibits no maxillary sinus tenderness and no frontal sinus tenderness.  Mouth/Throat: Uvula is midline and mucous membranes are normal. No uvula swelling. Posterior oropharyngeal erythema present. No posterior oropharyngeal edema.  Neck: Neck supple. No Brudzinski's sign and no Kernig's sign noted.  Cardiovascular: Normal rate, regular rhythm and normal heart sounds.   Pulmonary/Chest: No accessory muscle usage. No tachypnea. No respiratory distress. She has decreased breath sounds in the right upper field, the right middle field, the right lower field, the left upper field, the left middle field and the left lower field. She has no wheezes. She has no rhonchi. She has no rales. Chest wall is not dull to percussion. She exhibits no mass and no tenderness.  Lymphadenopathy:    She has no cervical adenopathy.   No results found for this or any previous visit.    Assessment & Plan:   Problem List Items Addressed This Visit    None    Visit Diagnoses  Bronchitis    -  Primary    CXR to r/o pneumonia. Likely due to bronchitis. Albuterol inhaler. Alarm symptoms reviewed. Cover with Zpak given duration of symptoms and shortness of breath.     Relevant Medications    albuterol (PROVENTIL HFA;VENTOLIN HFA) 108 (90 Base) MCG/ACT inhaler    benzonatate (TESSALON) 100 MG capsule    dextromethorphan-guaiFENesin (MUCINEX DM) 30-600 MG 12hr tablet    azithromycin (ZITHROMAX) 250  MG tablet    Other Relevant Orders    DG Chest 2 View (Completed)    Acute frontal sinusitis, recurrence not specified        Supportive care at home. PRN sudafed for congestion.     Relevant Medications    benzonatate (TESSALON) 100 MG capsule    dextromethorphan-guaiFENesin (MUCINEX DM) 30-600 MG 12hr tablet    azithromycin (ZITHROMAX) 250 MG tablet    pseudoephedrine (SUDAFED) 60 MG tablet    Sore throat        R/o strep.     Relevant Orders    POCT rapid strep A    Culture, Group A Strep    Upper respiratory infection        Supportive care at home. Alarm symptoms reviewed.     Relevant Medications    benzonatate (TESSALON) 100 MG capsule    azithromycin (ZITHROMAX) 250 MG tablet       Meds ordered this encounter  Medications  . albuterol (PROVENTIL HFA;VENTOLIN HFA) 108 (90 Base) MCG/ACT inhaler    Sig: Inhale 2 puffs into the lungs every 6 (six) hours as needed for wheezing or shortness of breath.    Dispense:  1 Inhaler    Refill:  1    Order Specific Question:  Supervising Provider    Answer:  Janeann Forehand [id number]  . benzonatate (TESSALON) 100 MG capsule    Sig: Take 1 capsule (100 mg total) by mouth 3 (three) times daily as needed.    Dispense:  30 capsule    Refill:  0    Order Specific Question:  Supervising Provider    Answer:  Janeann Forehand [[id number]]  . dextromethorphan-guaiFENesin (MUCINEX DM) 30-600 MG 12hr tablet    Sig: Take 1 tablet by mouth 2 (two) times daily.    Dispense:  20 tablet    Refill:  0    Order Specific Question:  Supervising Provider    Answer:  Janeann Forehand [phone]  . azithromycin (ZITHROMAX) 250 MG tablet    Sig: Take 2 tablets today and 1 tablet daily until bottle is empty.    Dispense:  6 tablet    Refill:  0    Order Specific Question:  Supervising Provider    Answer:  Janeann Forehand [[id number]]  . pseudoephedrine (SUDAFED) 60 MG tablet    Sig: Take 1 tablet (60 mg total) by mouth every 8 (eight) hours as  needed for congestion.    Dispense:  30 tablet    Refill:  0    Order Specific Question:  Supervising Provider    Answer:  Janeann Forehand [[id number]]      Follow up plan: No Follow-up on file.

## 2015-09-02 ENCOUNTER — Telehealth: Payer: Self-pay

## 2015-09-02 MED ORDER — ALBUTEROL SULFATE HFA 108 (90 BASE) MCG/ACT IN AERS: 2.0000 | INHALATION_SPRAY | Freq: Four times a day (QID) | RESPIRATORY_TRACT | Status: DC | PRN

## 2015-09-02 NOTE — Telephone Encounter (Signed)
Patients insurance will not cover ventolin.  According to pharmacy they will cover ProAir.  [facility] is requesting a nex rx for ProAir

## 2015-09-02 NOTE — Telephone Encounter (Signed)
Pt advised.

## 2015-09-02 NOTE — Telephone Encounter (Signed)
Sent. Please let patient know.

## 2015-09-03 LAB — CULTURE, GROUP A STREP: Strep A Culture: NEGATIVE

## 2015-09-04 ENCOUNTER — Encounter: Payer: Self-pay | Admitting: Family Medicine

## 2015-09-09 ENCOUNTER — Other Ambulatory Visit
Admission: RE | Admit: 2015-09-09 | Discharge: 2015-09-09 | Disposition: A | Discharge status: Home / Self Care | Payer: BC Managed Care – PPO | Source: Ambulatory Visit | Attending: Family Medicine | Admitting: Family Medicine

## 2015-09-09 DIAGNOSIS — N951 Menopausal and female climacteric states: Secondary | ICD-10-CM | POA: Diagnosis present

## 2015-09-09 DIAGNOSIS — Z8249 Family history of ischemic heart disease and other diseases of the circulatory system: Secondary | ICD-10-CM | POA: Insufficient documentation

## 2015-09-09 DIAGNOSIS — R5382 Chronic fatigue, unspecified: Secondary | ICD-10-CM | POA: Diagnosis present

## 2015-09-09 DIAGNOSIS — R635 Abnormal weight gain: Secondary | ICD-10-CM | POA: Insufficient documentation

## 2015-09-09 LAB — COMPREHENSIVE METABOLIC PANEL
ALBUMIN: 3.9 g/dL (ref 3.5–5.0)
ALT: 19 U/L (ref 14–54)
AST: 19 U/L (ref 15–41)
Alkaline Phosphatase: 96 U/L (ref 38–126)
Anion gap: 6 (ref 5–15)
BUN: 17 mg/dL (ref 6–20)
CHLORIDE: 106 mmol/L (ref 101–111)
CO2: 29 mmol/L (ref 22–32)
CREATININE: 0.97 mg/dL (ref 0.44–1.00)
Calcium: 8.8 mg/dL — ABNORMAL LOW (ref 8.9–10.3)
GFR calc non Af Amer: >60 mL/min (ref >60)
GLUCOSE: 124 mg/dL — AB (ref 65–99)
Potassium: 4.1 mmol/L (ref 3.5–5.1)
SODIUM: 141 mmol/L (ref 135–145)
Total Bilirubin: 0.4 mg/dL (ref 0.3–1.2)
Total Protein: 7.3 g/dL (ref 6.5–8.1)

## 2015-09-09 LAB — CBC WITH DIFFERENTIAL/PLATELET
Basophils Absolute: 0 10*3/uL (ref 0–0.1)
Basophils Relative: 1 %
EOS ABS: 0.1 10*3/uL (ref 0–0.7)
Eosinophils Relative: 1 %
HEMATOCRIT: 37.6 % (ref 35.0–47.0)
HEMOGLOBIN: 12.6 g/dL (ref 12.0–16.0)
LYMPHS ABS: 1.7 10*3/uL (ref 1.0–3.6)
Lymphocytes Relative: 31 %
MCH: 28.9 pg (ref 26.0–34.0)
MCHC: 33.6 g/dL (ref 32.0–36.0)
MCV: 86.2 fL (ref 80.0–100.0)
MONO ABS: 0.5 10*3/uL (ref 0.2–0.9)
MONOS PCT: 10 %
NEUTROS ABS: 3.2 10*3/uL (ref 1.4–6.5)
NEUTROS PCT: 57 %
Platelets: 315 10*3/uL (ref 150–440)
RBC: 4.37 MIL/uL (ref 3.80–5.20)
RDW: 13.8 % (ref 11.5–14.5)
WBC: 5.5 10*3/uL (ref 3.6–11.0)

## 2015-09-09 LAB — TSH: TSH: 1.54 u[IU]/mL (ref 0.350–4.500)

## 2015-09-09 LAB — LIPID PANEL
CHOL/HDL RATIO: 4.2 ratio
Cholesterol: 186 mg/dL (ref 0–200)
HDL: 44 mg/dL (ref >40)
LDL Cholesterol: 111 mg/dL — ABNORMAL HIGH (ref 0–99)
Triglycerides: 153 mg/dL — ABNORMAL HIGH (ref <150)
VLDL: 31 mg/dL (ref 0–40)

## 2015-09-10 ENCOUNTER — Other Ambulatory Visit: Payer: Self-pay | Admitting: Family Medicine

## 2015-09-10 ENCOUNTER — Other Ambulatory Visit
Admission: RE | Admit: 2015-09-10 | Discharge: 2015-09-10 | Disposition: A | Discharge status: Home / Self Care | Payer: BC Managed Care – PPO | Source: Ambulatory Visit | Attending: Family Medicine | Admitting: Family Medicine

## 2015-09-10 ENCOUNTER — Other Ambulatory Visit: Payer: Self-pay

## 2015-09-10 ENCOUNTER — Telehealth: Payer: Self-pay | Admitting: Family Medicine

## 2015-09-10 DIAGNOSIS — N951 Menopausal and female climacteric states: Secondary | ICD-10-CM | POA: Insufficient documentation

## 2015-09-10 DIAGNOSIS — R635 Abnormal weight gain: Secondary | ICD-10-CM | POA: Insufficient documentation

## 2015-09-10 DIAGNOSIS — R7309 Other abnormal glucose: Secondary | ICD-10-CM

## 2015-09-10 DIAGNOSIS — Z8249 Family history of ischemic heart disease and other diseases of the circulatory system: Secondary | ICD-10-CM | POA: Insufficient documentation

## 2015-09-10 DIAGNOSIS — R5382 Chronic fatigue, unspecified: Secondary | ICD-10-CM | POA: Insufficient documentation

## 2015-09-10 DIAGNOSIS — E559 Vitamin D deficiency, unspecified: Secondary | ICD-10-CM

## 2015-09-10 LAB — HEMOGLOBIN A1C: HEMOGLOBIN A1C: 5.7 % (ref 4.0–6.0)

## 2015-09-10 LAB — FSH/LH
FSH: 49.8 m[IU]/mL
LH: 33.7 m[IU]/mL

## 2015-09-10 LAB — VITAMIN D 25 HYDROXY (VIT D DEFICIENCY, FRACTURES): Vit D, 25-Hydroxy: 16.8 ng/mL — ABNORMAL LOW (ref 30.0–100.0)

## 2015-09-10 MED ORDER — VITAMIN D (ERGOCALCIFEROL) 1.25 MG (50000 UNIT) PO CAPS: 50000.0000 [IU] | ORAL_CAPSULE | ORAL | Status: DC

## 2015-09-10 NOTE — Addendum Note (Signed)
Addended by: Alease Frame on: 09/10/2015 01:33 PM   Modules accepted: Orders

## 2015-09-10 NOTE — Telephone Encounter (Signed)
Called lab to add on HgA1c.

## 2015-09-15 ENCOUNTER — Ambulatory Visit (INDEPENDENT_AMBULATORY_CARE_PROVIDER_SITE_OTHER): Payer: BC Managed Care – PPO | Admitting: Family Medicine

## 2015-09-15 ENCOUNTER — Encounter: Payer: Self-pay | Admitting: Family Medicine

## 2015-09-15 VITALS — BP 126/84 | HR 74 | Temp 98.7°F | Resp 16 | Ht 70.0 in | Wt 246.0 lb

## 2015-09-15 DIAGNOSIS — R058 Other specified cough: Secondary | ICD-10-CM

## 2015-09-15 DIAGNOSIS — R05 Cough: Secondary | ICD-10-CM | POA: Diagnosis not present

## 2015-09-15 DIAGNOSIS — R7303 Prediabetes: Secondary | ICD-10-CM

## 2015-09-15 DIAGNOSIS — E669 Obesity, unspecified: Secondary | ICD-10-CM

## 2015-09-15 DIAGNOSIS — E1169 Type 2 diabetes mellitus with other specified complication: Secondary | ICD-10-CM | POA: Insufficient documentation

## 2015-09-15 MED ORDER — FLUTICASONE PROPIONATE 50 MCG/ACT NA SUSP: 2.0000 | Freq: Every day | NASAL | Status: DC

## 2015-09-15 MED ORDER — LIRAGLUTIDE -WEIGHT MANAGEMENT 18 MG/3ML ~~LOC~~ SOPN: 3.0000 mg | PEN_INJECTOR | Freq: Every day | SUBCUTANEOUS | Status: DC

## 2015-09-15 NOTE — Assessment & Plan Note (Signed)
Reviewed prediabetes care. Encouraged elimination of soda intake. Reducing fast food intake. Encouraged pt to attent prediabetes class. Recheck in 3 mos.

## 2015-09-15 NOTE — Progress Notes (Signed)
Subjective:    Patient ID: Ashley Hale, female    DOB: [birth year], 55 y.o.   MRN: [id number]  HPI: Keerthi Hazell is a 55 y.o. female presenting on 09/15/2015 for Weight Check   HPI  Pt presents for obesity- would like to try Saxenda. No family history of thyroid cancer. About to have foot foot surgery- has slowed down exercise due to foot pain. Cleans houses and stays active. Diet- wants to do better. Drinks soda. Wants to cut out fast food. She has tried phentermine for weight loss in the past. It was helpful.  Previous provider prescribed Belviq but it was cost prohibitive.  Prediabetes- drinks soft drink daily. No desserts. Does endorse eating fast food.  Pt is still reporting congestion and hoarseness. COmpleted antibiotics. Still having cough. No chest tightness. Not using inhaler. No sinus pain or pressure. She thinks it is allergy related because she was outside all weekend.    Past Medical History  Diagnosis Date  . Obesity   . GERD (gastroesophageal reflux disease)   . IBS (irritable bowel syndrome)     Current Outpatient Prescriptions on File Prior to Visit  Medication Sig  . albuterol (PROAIR HFA) 108 (90 Base) MCG/ACT inhaler Inhale 2 puffs into the lungs every 6 (six) hours as needed for wheezing or shortness of breath.  Marland Kitchen azithromycin (ZITHROMAX) 250 MG tablet Take 2 tablets today and 1 tablet daily until bottle is empty.  . benzonatate (TESSALON) 100 MG capsule Take 1 capsule (100 mg total) by mouth 3 (three) times daily as needed.  Marland Kitchen dextromethorphan-guaiFENesin (MUCINEX DM) 30-600 MG 12hr tablet Take 1 tablet by mouth 2 (two) times daily.  . furosemide (LASIX) 40 MG tablet Take 1 tablet by mouth daily.  . Omeprazole 20 MG TBEC Take 1 capsule by mouth daily.  Marland Kitchen PARoxetine (PAXIL) 10 MG tablet Take 1 tablet (10 mg total) by mouth daily.  . pseudoephedrine (SUDAFED) 60 MG tablet Take 1 tablet (60 mg total) by mouth every 8 (eight) hours as needed for congestion.  .  ranitidine (ZANTAC) 150 MG capsule Take 1 capsule (150 mg total) by mouth 2 (two) times daily.  . Vitamin D, Ergocalciferol, (DRISDOL) 50000 units CAPS capsule Take 1 capsule (50,000 Units total) by mouth every 7 (seven) days.   No current facility-administered medications on file prior to visit.    Review of Systems  Constitutional: Positive for unexpected weight change. Negative for fever and chills.  HENT: Positive for postnasal drip, sneezing and voice change (hoarse). Negative for congestion, dental problem, ear discharge, ear pain, sinus pressure, sore throat and trouble swallowing.   Respiratory: Negative for cough, chest tightness and wheezing.   Cardiovascular: Negative for chest pain and leg swelling.  Gastrointestinal: Negative for nausea, vomiting, abdominal pain, diarrhea and constipation.  Endocrine: Negative.  Negative for cold intolerance, heat intolerance, polydipsia, polyphagia and polyuria.  Genitourinary: Negative for dysuria and difficulty urinating.  Musculoskeletal: Negative.   Neurological: Negative for dizziness, light-headedness and numbness.  Psychiatric/Behavioral: Negative.    Per HPI unless specifically indicated above     Objective:    BP 126/84 mmHg  Pulse 74  Temp(Src) 98.7 F (37.1 C) (Oral)  Resp 16  Ht  (1.778 m)  Wt 246 lb (111.585 kg)  BMI 35.30 kg/m2  Wt Readings from Last 3 Encounters:  09/15/15 246 lb (111.585 kg)  09/01/15 246 lb (111.585 kg)  08/15/15 244 lb 12.8 oz (111.041 kg)    Physical Exam  Constitutional: She  is oriented to person, place, and time. She appears well-developed and well-nourished.  HENT:  Head: Normocephalic and atraumatic.  Right Ear: Hearing and tympanic membrane normal.  Left Ear: Hearing and tympanic membrane normal.  Nose: Mucosal edema present. No rhinorrhea. Right sinus exhibits no maxillary sinus tenderness and no frontal sinus tenderness. Left sinus exhibits no maxillary sinus tenderness and no  frontal sinus tenderness.  Mouth/Throat: Posterior oropharyngeal erythema present.  Nasal drainage seen in oropharynx.   Neck: Neck supple.  Cardiovascular: Normal rate, regular rhythm and normal heart sounds.  Exam reveals no gallop and no friction rub.   No murmur heard. Pulmonary/Chest: Effort normal and breath sounds normal. She has no wheezes. She exhibits no tenderness.  Abdominal: Soft. Normal appearance and bowel sounds are normal. She exhibits no distension and no mass. There is no tenderness. There is no rebound and no guarding.  Musculoskeletal: Normal range of motion. She exhibits no edema or tenderness.  Lymphadenopathy:    She has no cervical adenopathy.  Neurological: She is alert and oriented to person, place, and time.  Skin: Skin is warm and dry.   Results for orders placed or performed during the hospital encounter of 09/10/15  Hemoglobin A1c  Result Value Ref Range   Hgb A1c MFr Bld 5.7 4.0 - 6.0 %      Assessment & Plan:   Problem List Items Addressed This Visit      Other   Obesity - Primary    Discussed strategies for weight loss. Goal is to decrease soda intake with goal to eliminate. Increase exercise as tolerated after surgery. Have discussed risks vs benefits of Saxenda. Pt directed to get co-pay card from online. We will do a 6 week trial with lifestyle changes.  Recheck 6 weeks.       Relevant Medications   Liraglutide -Weight Management (SAXENDA) 18 MG/3ML SOPN   Prediabetes    Reviewed prediabetes care. Encouraged elimination of soda intake. Reducing fast food intake. Encouraged pt to attent prediabetes class. Recheck in 3 mos.        Other Visit Diagnoses    Post-viral cough syndrome        WBC WNL- no s/s of infection. Will treat for post viral cough wiht flonase and OTC antihistamine.     Relevant Medications    fluticasone (FLONASE) 50 MCG/ACT nasal spray    Other Relevant Orders    CBC with Differential/Platelet       Meds ordered  this encounter  Medications  . Liraglutide -Weight Management (SAXENDA) 18 MG/3ML SOPN    Sig: Inject 3 mg into the skin daily.    Dispense:  15 mL    Refill:  11    Order Specific Question:  Supervising Provider    Answer:  Janeann Forehand [phone]  . fluticasone (FLONASE) 50 MCG/ACT nasal spray    Sig: Place 2 sprays into both nostrils daily.    Dispense:  16 g    Refill:  11    Order Specific Question:  Supervising Provider    Answer:  Janeann Forehand [phone]      Follow up plan: Return in about 6 weeks (around 10/27/2015) for Weight check. Marland Kitchen

## 2015-09-15 NOTE — Patient Instructions (Addendum)
Weight loss: Goal 1- Eliminate Soda. Start by reducing slowly. Set a date to stop drinking all together.  When you are cleared to walk- start with 15 minutes per day. Goal to increase up to 30-40.  Brisk walking is all you need.   Try Flonase and OTC Zyrtec for cough/congestion symptoms. We will recheck a white count to make sure it won't impact your surgery.   Liraglutide injection (Weight Management) What is this medicine? LIRAGLUTIDE (LIR a GLOO tide) is used with a reduced calorie diet and exercise to help you lose weight. This medicine may be used for other purposes; ask your health care provider or pharmacist if you have questions. What should I tell my health care provider before I take this medicine? They need to know if you have any of these conditions: -endocrine tumors (MEN 2) or if someone in your family had these tumors -gallstones -high cholesterol -history of alcohol abuse problem -history of pancreatitis -kidney disease or if you are on dialysis -liver disease -previous swelling of the tongue, face, or lips with difficulty breathing, difficulty swallowing, hoarseness, or tightening of the throat -stomach problems -suicidal thoughts, plans, or attempt; a previous suicide attempt by you or a family member -thyroid cancer or if someone in your family had thyroid cancer -an unusual or allergic reaction to liraglutide, medicines, foods, dyes, or preservatives -pregnant or trying to get pregnant -breast-feeding How should I use this medicine? This medicine is for injection under the skin of your upper leg, stomach area, or upper arm. You will be taught how to prepare and give this medicine. Use exactly as directed. Take your medicine at regular intervals. Do not take it more often than directed. It is important that you put your used needles and syringes in a special sharps container. Do not put them in a trash can. If you do not have a sharps container, call your pharmacist or  healthcare provider to get one. A special MedGuide will be given to you by the pharmacist with each prescription and refill. Be sure to read this information carefully each time. Talk to your pediatrician regarding the use of this medicine in children. Special care may be needed. Overdosage: If you think you have taken too much of this medicine contact a poison control center or emergency room at once. NOTE: This medicine is only for you. Do not share this medicine with others. What if I miss a dose? If you miss a dose, take it as soon as you can. If it is almost time for your next dose, take only that dose. Do not take double or extra doses. If you miss your dose for 3 days or more, call your doctor or health care professional to talk about how to restart this medicine. What may interact with this medicine? -acetaminophen -atorvastatin -birth control pills -digoxin -griseofulvin -lisinopril This list may not describe all possible interactions. Give your health care provider a list of all the medicines, herbs, non-prescription drugs, or dietary supplements you use. Also tell them if you smoke, drink alcohol, or use illegal drugs. Some items may interact with your medicine. What should I watch for while using this medicine? Visit your doctor or health care professional for regular checks on your progress. This medicine is intended to be used in addition to a healthy diet and appropriate exercise. The best results are achieved this way. Do not increase or in any way change your dose without consulting your doctor or health care professional. This medicine  may affect blood sugar levels. If you have diabetes, check with your doctor or health care professional before you change your diet or the dose of your diabetic medicine. Patients and their families should watch out for worsening depression or thoughts of suicide. Also watch out for sudden changes in feelings such as feeling anxious, agitated,  panicky, irritable, hostile, aggressive, impulsive, severely restless, overly excited and hyperactive, or not being able to sleep. If this happens, especially at the beginning of treatment or after a change in dose, call your health care professional. What side effects may I notice from receiving this medicine? Side effects that you should report to your doctor or health care professional as soon as possible: -allergic reactions like skin rash, itching or hives, swelling of the face, lips, or tongue -breathing problems -fever, chills -loss of appetite -signs and symptoms of low blood sugar such as feeling anxious, confusion, dizziness, increased hunger, unusually weak or tired, sweating, shakiness, cold, irritable, headache, blurred vision, fast heartbeat, loss of consciousness -trouble passing urine or change in the amount of urine -unusual stomach pain or upset -vomiting Side effects that usually do not require medical attention (Report these to your doctor or health care professional if they continue or are bothersome.): -constipation -diarrhea -fatigue -headache -nausea This list may not describe all possible side effects. Call your doctor for medical advice about side effects. You may report side effects to FDA at 1-800-FDA-1088. Where should I keep my medicine? Keep out of the reach of children. Store unopened pen in a refrigerator between 2 and 8 degrees C (36 and 46 degrees F). Do not freeze or use if the medicine has been frozen. Protect from light and excessive heat. After you first use the pen, it can be stored at room temperature between 15 and 30 degrees C (59 and 86 degrees F) or in a refrigerator. Throw away your used pen after 30 days or after the expiration date, whichever comes first. Do not store your pen with the needle attached. If the needle is left on, medicine may leak from the pen. NOTE: This sheet is a summary. It may not cover all possible information. If you have  questions about this medicine, talk to your doctor, pharmacist, or health care provider.    2016, Elsevier/Gold Standard. (2013-08-30 12:29:49)

## 2015-09-15 NOTE — Assessment & Plan Note (Signed)
Discussed strategies for weight loss. Goal is to decrease soda intake with goal to eliminate. Increase exercise as tolerated after surgery. Have discussed risks vs benefits of Saxenda. Pt directed to get co-pay card from online. We will do a 6 week trial with lifestyle changes.  Recheck 6 weeks.

## 2015-09-17 ENCOUNTER — Telehealth: Payer: Self-pay | Admitting: *Deleted

## 2015-09-17 ENCOUNTER — Encounter: Payer: Self-pay | Admitting: *Deleted

## 2015-09-17 NOTE — Telephone Encounter (Signed)
Pt states she has surgery 09/19/2015 with Dr. Al Corpus, and is scheduled for Jury Duty on 10/02/2015 when she is suppose to be off her feet.  Pt states she needs a note explaining why she can not perform jury duty, and needs to had it in before 09/22/2015.

## 2015-09-17 NOTE — Telephone Encounter (Signed)
I called and left patient a message that she can come by to pick up her letter.  I'll leave it at the front desk.

## 2015-09-18 ENCOUNTER — Other Ambulatory Visit: Payer: Self-pay | Admitting: Podiatry

## 2015-09-18 MED ORDER — OXYCODONE-ACETAMINOPHEN 10-325 MG PO TABS: ORAL_TABLET | ORAL | Status: DC

## 2015-09-18 MED ORDER — PROMETHAZINE HCL 25 MG PO TABS: 25.0000 mg | ORAL_TABLET | Freq: Three times a day (TID) | ORAL | Status: DC | PRN

## 2015-09-18 MED ORDER — CEPHALEXIN 500 MG PO CAPS: 500.0000 mg | ORAL_CAPSULE | Freq: Three times a day (TID) | ORAL | Status: DC

## 2015-09-19 ENCOUNTER — Encounter: Payer: Self-pay | Admitting: Podiatry

## 2015-09-19 DIAGNOSIS — M21541 Acquired clubfoot, right foot: Secondary | ICD-10-CM | POA: Diagnosis not present

## 2015-09-19 DIAGNOSIS — M2011 Hallux valgus (acquired), right foot: Secondary | ICD-10-CM | POA: Diagnosis not present

## 2015-09-20 ENCOUNTER — Telehealth: Payer: Self-pay

## 2015-09-20 NOTE — Telephone Encounter (Signed)
Left message for pt to call with questions or concerns regarding post operative status 

## 2015-09-24 ENCOUNTER — Encounter: Payer: Self-pay | Admitting: Podiatry

## 2015-09-24 ENCOUNTER — Ambulatory Visit (INDEPENDENT_AMBULATORY_CARE_PROVIDER_SITE_OTHER): Payer: BC Managed Care – PPO

## 2015-09-24 ENCOUNTER — Ambulatory Visit (INDEPENDENT_AMBULATORY_CARE_PROVIDER_SITE_OTHER): Payer: BC Managed Care – PPO | Admitting: Podiatry

## 2015-09-24 VITALS — BP 128/65 | HR 82 | Resp 16

## 2015-09-24 DIAGNOSIS — Z9889 Other specified postprocedural states: Secondary | ICD-10-CM

## 2015-09-24 DIAGNOSIS — M205X1 Other deformities of toe(s) (acquired), right foot: Secondary | ICD-10-CM

## 2015-09-24 DIAGNOSIS — M2041 Other hammer toe(s) (acquired), right foot: Secondary | ICD-10-CM

## 2015-09-24 MED ORDER — OXYCODONE-ACETAMINOPHEN 10-325 MG PO TABS: ORAL_TABLET | ORAL | Status: DC

## 2015-09-24 NOTE — Progress Notes (Signed)
She presents today for her first postop visit she is status post Keller arthroplasty with a single silicone implant first metatarsophalangeal joint right foot second metatarsal osteotomy with hammertoe repair #2 #3 with screws. She denies fever chills nausea vomiting muscle aches and pains. Other than the foot being painful to walk on. She states that the sensation came back from the nerve block Saturday afternoon.  Objective: Vital signs stable alert and oriented 3. Dry sterile dressing was removed demonstrate moderate edema no erythema cellulitis drainage or odor incision sites appear to be healing and well coapted. Radiographs do demonstrate well-healing surgical foot no erythema edema cellulitis drainage or odor.  Assessment: Well-healing surgical foot right.  Plan: Redressed today dressed a compressive dressing follow-up with her in 1 week and will refill her medications.

## 2015-10-01 ENCOUNTER — Ambulatory Visit (INDEPENDENT_AMBULATORY_CARE_PROVIDER_SITE_OTHER): Payer: BC Managed Care – PPO | Admitting: Podiatry

## 2015-10-01 DIAGNOSIS — M2041 Other hammer toe(s) (acquired), right foot: Secondary | ICD-10-CM

## 2015-10-01 DIAGNOSIS — M205X1 Other deformities of toe(s) (acquired), right foot: Secondary | ICD-10-CM | POA: Diagnosis not present

## 2015-10-01 DIAGNOSIS — Z9889 Other specified postprocedural states: Secondary | ICD-10-CM

## 2015-10-01 NOTE — Progress Notes (Signed)
She presents today nearly 2 weeks status post Keller arthroplasty with a single silicone implant right foot second metatarsal osteotomy with screws right foot hammertoe repair #2 and #3 with screws right foot. She states that she has had to be up on her foot a lot secondary to birth of a grandchild. She states that her foot hurts. She denies fever chills nausea vomiting muscle aches and pains.  Objective: Dry sterile dressing intact was removed demonstrates margins are well coapted she has good range of motion of all the surgical sites. No signs of infection but considerable edema.  Assessment: Well-healing surgical foot right. Date of surgery 09/19/2015.  Plan: Removed sutures today put her in a compression anklet and a Darco digital splint to plantarflex the second toe. I will follow-up with her in 2 weeks. We also dispensed a Darco shoe.

## 2015-10-15 ENCOUNTER — Encounter: Payer: Self-pay | Admitting: Podiatry

## 2015-10-15 ENCOUNTER — Ambulatory Visit (INDEPENDENT_AMBULATORY_CARE_PROVIDER_SITE_OTHER): Payer: BC Managed Care – PPO

## 2015-10-15 ENCOUNTER — Ambulatory Visit (INDEPENDENT_AMBULATORY_CARE_PROVIDER_SITE_OTHER): Payer: BC Managed Care – PPO | Admitting: Podiatry

## 2015-10-15 DIAGNOSIS — Z9889 Other specified postprocedural states: Secondary | ICD-10-CM

## 2015-10-15 DIAGNOSIS — M2041 Other hammer toe(s) (acquired), right foot: Secondary | ICD-10-CM

## 2015-10-15 DIAGNOSIS — M205X1 Other deformities of toe(s) (acquired), right foot: Secondary | ICD-10-CM | POA: Diagnosis not present

## 2015-10-15 NOTE — Progress Notes (Signed)
She presents today for follow-up of her Lorenz CoasterKeller arthroplasty with a single silicone implant hammertoe repair #2 and #3 of the right foot. She states that she is doing very well and her daughter tried to commit suicide by taking an overdose of medication. She states she's been on her foot a lot so she experienced more swelling and more tenderness.  Objective: Vital signs are stable alert and oriented 3. She has good range of motion of the first metatarsophalangeal joint and the toes #2 and #3 set rectus and are doing very well. No open lesions no wounds.  Assessment: Well-healing surgical foot moderate edema.  Plan: Encourage range of motion exercises and loose fitting shoe gear to actually try to walk and heel to toe. Follow up with her in 3 weeks.

## 2015-10-27 ENCOUNTER — Ambulatory Visit: Payer: BC Managed Care – PPO | Admitting: Family Medicine

## 2015-11-10 ENCOUNTER — Encounter: Payer: Self-pay | Admitting: Podiatry

## 2015-11-10 ENCOUNTER — Encounter: Payer: BC Managed Care – PPO | Admitting: Podiatry

## 2015-11-10 ENCOUNTER — Ambulatory Visit (INDEPENDENT_AMBULATORY_CARE_PROVIDER_SITE_OTHER): Payer: BC Managed Care – PPO

## 2015-11-10 ENCOUNTER — Ambulatory Visit (INDEPENDENT_AMBULATORY_CARE_PROVIDER_SITE_OTHER): Payer: BC Managed Care – PPO | Admitting: Podiatry

## 2015-11-10 VITALS — BP 133/81 | HR 85 | Resp 16

## 2015-11-10 DIAGNOSIS — Z9889 Other specified postprocedural states: Secondary | ICD-10-CM

## 2015-11-10 DIAGNOSIS — M2041 Other hammer toe(s) (acquired), right foot: Secondary | ICD-10-CM

## 2015-11-10 DIAGNOSIS — M205X1 Other deformities of toe(s) (acquired), right foot: Secondary | ICD-10-CM

## 2015-11-10 NOTE — Progress Notes (Signed)
She presents today for another follow-up visit regarding Keller arthroplasty with single silicone implant hammertoe repairs #2 and #3 of the right foot. Date of surgery 09/19/2015. She states that she does greater the day however at night she has burning stabbing pains.  Objective: Vital signs are stable she is alert and oriented 3. Pulses are palpable. She wearing regular shoes. Mild edema. No cellulitis drainage or odor. Considerable scar tissue still present. She has great range of motion of the first metatarsophalangeal joint with hammertoe deformities healing. Redressed confirm well-healing surgical foot.  Assessment: Well-healing surgical foot right date of surgery 09/19/2015.  Plan: Encouraged her to continue range of motion exercises massage therapy and I will follow-up with her in 6 weeks

## 2015-12-10 ENCOUNTER — Encounter: Payer: Self-pay | Admitting: Family Medicine

## 2015-12-10 ENCOUNTER — Ambulatory Visit (INDEPENDENT_AMBULATORY_CARE_PROVIDER_SITE_OTHER): Payer: BC Managed Care – PPO | Admitting: Family Medicine

## 2015-12-10 VITALS — BP 137/85 | HR 68 | Temp 98.7°F | Resp 16 | Ht 70.0 in | Wt 244.0 lb

## 2015-12-10 DIAGNOSIS — J0101 Acute recurrent maxillary sinusitis: Secondary | ICD-10-CM | POA: Diagnosis not present

## 2015-12-10 DIAGNOSIS — E669 Obesity, unspecified: Secondary | ICD-10-CM

## 2015-12-10 DIAGNOSIS — R7303 Prediabetes: Secondary | ICD-10-CM

## 2015-12-10 DIAGNOSIS — E559 Vitamin D deficiency, unspecified: Secondary | ICD-10-CM | POA: Diagnosis not present

## 2015-12-10 DIAGNOSIS — N951 Menopausal and female climacteric states: Secondary | ICD-10-CM

## 2015-12-10 LAB — POCT GLYCOSYLATED HEMOGLOBIN (HGB A1C): Hemoglobin A1C: 6

## 2015-12-10 MED ORDER — AMOXICILLIN-POT CLAVULANATE 875-125 MG PO TABS: 1.0000 | ORAL_TABLET | Freq: Two times a day (BID) | ORAL | Status: DC

## 2015-12-10 MED ORDER — METFORMIN HCL ER 500 MG PO TB24: 500.0000 mg | ORAL_TABLET | Freq: Every day | ORAL | Status: DC

## 2015-12-10 MED ORDER — BENZONATATE 100 MG PO CAPS: 100.0000 mg | ORAL_CAPSULE | Freq: Three times a day (TID) | ORAL | Status: DC | PRN

## 2015-12-10 MED ORDER — PAROXETINE HCL 20 MG PO TABS: 20.0000 mg | ORAL_TABLET | Freq: Every day | ORAL | Status: DC

## 2015-12-10 NOTE — Assessment & Plan Note (Signed)
Recheck levels- still having symptoms of fatigue. Continue therapy if needed.

## 2015-12-10 NOTE — Patient Instructions (Addendum)
[email or web address]- go online and sign up for savings card. Run the insurance benefits and get coupon at [facility]  Hot flashes: Try increasing paxil to 20mg  once daily. Take 2 pills of your 10mg  until bottle empty.  Try Jeffie PollockEstroven or [employer] OTC to help with hot flashes. Layer clothing, fan at bedside.   Make an appt to schedule lab work to check Vitamin D and calcium in next 2 weeks.   SInus: Take AUgmentin twice daily for 7 days. Take Sudafed for congestion. Keep doing flonase and take tessalon perles as needed for cough.

## 2015-12-10 NOTE — Assessment & Plan Note (Signed)
Start metformin once daily to aid in sugar control and weight loss. Reviewed diet changes. Encouraged physical activity. Recheck 3 mos.

## 2015-12-10 NOTE — Assessment & Plan Note (Signed)
Encouraged pt to get coupon for Saxenda and trial medication for weight loss. F/u 6 weeks.

## 2015-12-10 NOTE — Progress Notes (Signed)
Subjective:    Patient ID: Ashley Hale, female    DOB: 30-Nov-1960, 55 y.o.   MRN: [id number]  HPI: Ashley Hale is a 55 y.o. female presenting on 12/10/2015 for Hot Flashes and Hyperglycemia   HPI  Pt presents for follow-up of prediabetes. Last A1c was 5.7%- now up to 6.0 Weight is stable. Recently had surgery and family stressors. Has not made diet changes.  Obesity: Previously prescribed Saxenda for weight loss. Never picked up. She has gained 50lbs over past 5 years and is having trouble getting the weight off. She is concerned about weight gain and her health.  Having hot flashes all day long. At night up every hour. Paxil is no longer helping. Used to control her hot flashes completely.  Constipation- since surgery. Added probiotic gummies. 1 BM per day average but strains.  SInus congestion: 2 weeks of congestion. Green productive sputum. SInus pain and pressure.   Past Medical History  Diagnosis Date  . Obesity   . GERD (gastroesophageal reflux disease)   . IBS (irritable bowel syndrome)     Current Outpatient Prescriptions on File Prior to Visit  Medication Sig  . albuterol (PROAIR HFA) 108 (90 Base) MCG/ACT inhaler Inhale 2 puffs into the lungs every 6 (six) hours as needed for wheezing or shortness of breath.  . fluticasone (FLONASE) 50 MCG/ACT nasal spray Place 2 sprays into both nostrils daily.  . furosemide (LASIX) 40 MG tablet Take 1 tablet by mouth daily.  . Liraglutide -Weight Management (SAXENDA) 18 MG/3ML SOPN Inject 3 mg into the skin daily.  . Omeprazole 20 MG TBEC Take 1 capsule by mouth daily.  . ranitidine (ZANTAC) 150 MG capsule Take 1 capsule (150 mg total) by mouth 2 (two) times daily.  . Vitamin D, Ergocalciferol, (DRISDOL) 50000 units CAPS capsule Take 1 capsule (50,000 Units total) by mouth every 7 (seven) days.   No current facility-administered medications on file prior to visit.    Review of Systems  Constitutional: Positive for unexpected weight  change. Negative for fever and chills.  HENT: Negative.   Respiratory: Negative for cough, chest tightness and wheezing.   Cardiovascular: Negative for chest pain and leg swelling.  Gastrointestinal: Negative for nausea, vomiting, abdominal pain, diarrhea and constipation.  Endocrine: Negative.  Negative for cold intolerance, heat intolerance (hot flashes), polydipsia, polyphagia and polyuria.  Genitourinary: Negative for dysuria and difficulty urinating.  Musculoskeletal: Negative.   Neurological: Negative for dizziness, light-headedness and numbness.  Psychiatric/Behavioral: Negative.    Per HPI unless specifically indicated above     Objective:    BP 137/85 mmHg  Pulse 68  Temp(Src) 98.7 F (37.1 C) (Oral)  Resp 16  Ht  (1.778 m)  Wt 244 lb (110.678 kg)  BMI 35.01 kg/m2  Wt Readings from Last 3 Encounters:  12/10/15 244 lb (110.678 kg)  09/15/15 246 lb (111.585 kg)  09/01/15 246 lb (111.585 kg)    Physical Exam  Constitutional: She is oriented to person, place, and time. She appears well-developed and well-nourished.  HENT:  Head: Normocephalic and atraumatic.  Right Ear: Hearing and tympanic membrane normal.  Left Ear: Hearing and tympanic membrane normal.  Nose: Mucosal edema and rhinorrhea present. Right sinus exhibits maxillary sinus tenderness. Left sinus exhibits maxillary sinus tenderness.  Mouth/Throat: Uvula is midline and mucous membranes are normal. Posterior oropharyngeal erythema present.  Neck: Neck supple.  Cardiovascular: Normal rate, regular rhythm and normal heart sounds.  Exam reveals no gallop and no friction rub.  No murmur heard. Pulmonary/Chest: Effort normal and breath sounds normal. She has no wheezes. She exhibits no tenderness.  Abdominal: Soft. Normal appearance and bowel sounds are normal. She exhibits no distension and no mass. There is no tenderness. There is no rebound and no guarding.  Musculoskeletal: Normal range of motion. She  exhibits no edema or tenderness.  Lymphadenopathy:    She has no cervical adenopathy.  Neurological: She is alert and oriented to person, place, and time.  Skin: Skin is warm and dry.   Results for orders placed or performed in visit on 12/10/15  POCT HgB A1C  Result Value Ref Range   Hemoglobin A1C 6.0%       Assessment & Plan:   Problem List Items Addressed This Visit      Other   Hot flash, menopausal    Increase paxil to 20mg . Pt has failed gabapentin in past. Trial of Estroven or black cohash for symptoms. Consider clonidine vs GYN referral for HRT.       Relevant Medications   PARoxetine (PAXIL) 20 MG tablet   Obesity    Encouraged pt to get coupon for Saxenda and trial medication for weight loss. F/u 6 weeks.       Relevant Medications   metFORMIN (GLUCOPHAGE-XR) 500 MG 24 hr tablet   Prediabetes - Primary    Start metformin once daily to aid in sugar control and weight loss. Reviewed diet changes. Encouraged physical activity. Recheck 3 mos.       Relevant Medications   metFORMIN (GLUCOPHAGE-XR) 500 MG 24 hr tablet   Other Relevant Orders   POCT HgB A1C (Completed)   Basic Metabolic Panel (BMET)   Vitamin D deficiency    Recheck levels- still having symptoms of fatigue. Continue therapy if needed.       Relevant Orders   VITAMIN D 25 Hydroxy (Vit-D Deficiency, Fractures)    Other Visit Diagnoses    Acute recurrent maxillary sinusitis        Treat for sinusitis. Augmentin BID for 7 days. Home treatment. Return if not improving.     Relevant Medications    amoxicillin-clavulanate (AUGMENTIN) 875-125 MG tablet    benzonatate (TESSALON) 100 MG capsule       Meds ordered this encounter  Medications  . PARoxetine (PAXIL) 20 MG tablet    Sig: Take 1 tablet (20 mg total) by mouth daily.    Dispense:  30 tablet    Refill:  11    Order Specific Question:  Supervising Provider    Answer:  Janeann ForehandHAWKINS JR, JAMES H [phone][[id number]]  . metFORMIN (GLUCOPHAGE-XR) 500 MG 24 hr  tablet    Sig: Take 1 tablet (500 mg total) by mouth daily with breakfast.    Dispense:  30 tablet    Refill:  11    Order Specific Question:  Supervising Provider    Answer:  Janeann ForehandHAWKINS JR, JAMES H [phone][[id number]]  . amoxicillin-clavulanate (AUGMENTIN) 875-125 MG tablet    Sig: Take 1 tablet by mouth 2 (two) times daily.    Dispense:  14 tablet    Refill:  0    Order Specific Question:  Supervising Provider    Answer:  Janeann ForehandHAWKINS JR, JAMES H [phone][[id number]]  . benzonatate (TESSALON) 100 MG capsule    Sig: Take 1 capsule (100 mg total) by mouth 3 (three) times daily as needed.    Dispense:  30 capsule    Refill:  0    Order Specific Question:  Supervising Provider  Answer:  Janeann Forehand [[id number]]      Follow up plan: Return in about 3 months (around 03/11/2016) for Prediabetes. Marland Kitchen

## 2015-12-10 NOTE — Assessment & Plan Note (Signed)
Increase paxil to 20mg . Pt has failed gabapentin in past. Trial of Estroven or black cohash for symptoms. Consider clonidine vs GYN referral for HRT.

## 2015-12-22 ENCOUNTER — Ambulatory Visit (INDEPENDENT_AMBULATORY_CARE_PROVIDER_SITE_OTHER): Payer: BC Managed Care – PPO | Admitting: Podiatry

## 2015-12-22 ENCOUNTER — Ambulatory Visit (INDEPENDENT_AMBULATORY_CARE_PROVIDER_SITE_OTHER): Payer: BC Managed Care – PPO

## 2015-12-22 ENCOUNTER — Encounter: Payer: Self-pay | Admitting: Podiatry

## 2015-12-22 VITALS — BP 118/59 | HR 79 | Resp 16

## 2015-12-22 DIAGNOSIS — M2041 Other hammer toe(s) (acquired), right foot: Secondary | ICD-10-CM

## 2015-12-22 DIAGNOSIS — M205X1 Other deformities of toe(s) (acquired), right foot: Secondary | ICD-10-CM | POA: Diagnosis not present

## 2015-12-22 DIAGNOSIS — Z9889 Other specified postprocedural states: Secondary | ICD-10-CM

## 2015-12-22 NOTE — Progress Notes (Signed)
She presents today for follow-up of a Keller arthroplasty single silicone implant right foot hammertoe repair second third right as well as the second metatarsophalangeal joint osteotomy. She states it is doing good every once in a while little pain in the joint as she points to the first metatarsophalangeal joint but she states that that subsides readily.  Objective: Vital signs are stable alert and oriented 3. Pulses are palpable. She has good range of motion of the first metatarsophalangeal joint slightly restricted on dorsiflexion. Radiographs taken today do demonstrate well-healing surgical foot right.  Assessment: Well-healing surgical foot right.  Plan: I would allow her to get back to her regular routine follow-up with her as needed.

## 2016-03-12 ENCOUNTER — Encounter: Payer: Self-pay | Admitting: *Deleted

## 2016-03-12 NOTE — Progress Notes (Signed)
   DOS 09-19-15  Ashley Hale bunionectomy with implant right, metatarsal osteotomy 2nd met right, hammer toe 2nd and 3rd toes right

## 2016-03-23 ENCOUNTER — Other Ambulatory Visit: Payer: Self-pay | Admitting: *Deleted

## 2016-03-23 ENCOUNTER — Other Ambulatory Visit: Payer: Self-pay | Admitting: Family Medicine

## 2016-03-23 DIAGNOSIS — E559 Vitamin D deficiency, unspecified: Secondary | ICD-10-CM

## 2016-03-23 DIAGNOSIS — N951 Menopausal and female climacteric states: Secondary | ICD-10-CM

## 2016-03-23 DIAGNOSIS — R7303 Prediabetes: Secondary | ICD-10-CM

## 2016-03-23 LAB — CBC WITH DIFFERENTIAL/PLATELET
BASOS ABS: 0 {cells}/uL (ref 0–200)
Basophils Relative: 0 %
EOS PCT: 2 %
Eosinophils Absolute: 134 cells/uL (ref 15–500)
HCT: 38.2 % (ref 35.0–45.0)
Hemoglobin: 12.8 g/dL (ref 11.7–15.5)
Lymphocytes Relative: 33 %
Lymphs Abs: 2211 cells/uL (ref 850–3900)
MCH: 28.7 pg (ref 27.0–33.0)
MCHC: 33.5 g/dL (ref 32.0–36.0)
MCV: 85.7 fL (ref 80.0–100.0)
MONOS PCT: 12 %
MPV: 9.4 fL (ref 7.5–12.5)
Monocytes Absolute: 804 cells/uL (ref 200–950)
NEUTROS ABS: 3551 {cells}/uL (ref 1500–7800)
NEUTROS PCT: 53 %
PLATELETS: 276 10*3/uL (ref 140–400)
RBC: 4.46 MIL/uL (ref 3.80–5.10)
RDW: 13.8 % (ref 11.0–15.0)
WBC: 6.7 10*3/uL (ref 3.8–10.8)

## 2016-03-24 LAB — VITAMIN D 25 HYDROXY (VIT D DEFICIENCY, FRACTURES): VIT D 25 HYDROXY: 22 ng/mL — AB (ref 30–100)

## 2016-03-24 LAB — BASIC METABOLIC PANEL
BUN: 17 mg/dL (ref 7–25)
CALCIUM: 9.5 mg/dL (ref 8.6–10.4)
CO2: 25 mmol/L (ref 20–31)
Chloride: 101 mmol/L (ref 98–110)
Creat: 1.07 mg/dL — ABNORMAL HIGH (ref 0.50–1.05)
GLUCOSE: 86 mg/dL (ref 65–99)
Potassium: 4.1 mmol/L (ref 3.5–5.3)
SODIUM: 140 mmol/L (ref 135–146)

## 2016-03-26 ENCOUNTER — Ambulatory Visit (INDEPENDENT_AMBULATORY_CARE_PROVIDER_SITE_OTHER): Payer: BC Managed Care – PPO | Admitting: Family Medicine

## 2016-03-26 ENCOUNTER — Encounter: Payer: Self-pay | Admitting: Family Medicine

## 2016-03-26 VITALS — BP 110/73 | HR 79 | Temp 98.5°F | Resp 16 | Ht 70.0 in | Wt 234.6 lb

## 2016-03-26 DIAGNOSIS — R7303 Prediabetes: Secondary | ICD-10-CM

## 2016-03-26 DIAGNOSIS — E559 Vitamin D deficiency, unspecified: Secondary | ICD-10-CM | POA: Diagnosis not present

## 2016-03-26 DIAGNOSIS — G2581 Restless legs syndrome: Secondary | ICD-10-CM | POA: Diagnosis not present

## 2016-03-26 DIAGNOSIS — E669 Obesity, unspecified: Secondary | ICD-10-CM | POA: Diagnosis not present

## 2016-03-26 LAB — POCT GLYCOSYLATED HEMOGLOBIN (HGB A1C): HEMOGLOBIN A1C: 5.8

## 2016-03-26 MED ORDER — ROPINIROLE HCL 0.25 MG PO TABS: ORAL_TABLET | ORAL | 11 refills | Status: DC

## 2016-03-26 MED ORDER — VITAMIN D (ERGOCALCIFEROL) 1.25 MG (50000 UNIT) PO CAPS: 50000.0000 [IU] | ORAL_CAPSULE | ORAL | 1 refills | Status: DC

## 2016-03-26 NOTE — Patient Instructions (Signed)
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  Take at bedtime.   Weight loss: Try eating regular meals. Ensure you eat breakfast. Add 10 minutes after each meal of exercise.

## 2016-03-26 NOTE — Assessment & Plan Note (Signed)
A1c doing well. Down to 5.8%. Encouraged continued weight loss. Loss of 10lbs since last visit. Reviewed diet and lifestyle changes. Encouraged 10 minutes exercise after every meal.

## 2016-03-26 NOTE — Assessment & Plan Note (Signed)
Loss of 10lbs since last visit. Commended pt work on diet changes. Reviewed need for regular exercise outside of usual activities.

## 2016-03-26 NOTE — Assessment & Plan Note (Signed)
Continue vitamin D supplements. Recheck 3 mos.

## 2016-03-26 NOTE — Progress Notes (Signed)
Subjective:    Patient ID: Ashley Hale, female    DOB: [birth year], 55 y.o.   MRN: [id number]  HPI: Ashley Hale is a 55 y.o. female presenting on 03/26/2016 for prediabetes   HPI  Pt presents for prediabetes today. Overall doing well. A1c is down to 5.8% from 6.0. Is trying to lose weight has lost 10lbs since May.  Has cut down on the sugars. Is not exercising regularly. 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 Creepy crawlies in the legs. Can't be still. Gets up and moves around and it goes away. Recent CBC. Will add on ferritin.    Past Medical History:  Diagnosis Date  . GERD (gastroesophageal reflux disease)   . IBS (irritable bowel syndrome)   . Obesity     Current Outpatient Prescriptions on File Prior to Visit  Medication Sig  . albuterol (PROAIR HFA) 108 (90 Base) MCG/ACT inhaler Inhale 2 puffs into the lungs every 6 (six) hours as needed for wheezing or shortness of breath.  . fluticasone (FLONASE) 50 MCG/ACT nasal spray Place 2 sprays into both nostrils daily.  . furosemide (LASIX) 40 MG tablet Take 1 tablet by mouth daily.  . metFORMIN (GLUCOPHAGE-XR) 500 MG 24 hr tablet Take 1 tablet (500 mg total) by mouth daily with breakfast.  . Omeprazole 20 MG TBEC Take 1 capsule by mouth daily.  Marland Kitchen PARoxetine (PAXIL) 20 MG tablet Take 1 tablet (20 mg total) by mouth daily.  . ranitidine (ZANTAC) 150 MG capsule Take 1 capsule (150 mg total) by mouth 2 (two) times daily.   No current facility-administered medications on file prior to visit.     Review of Systems  Constitutional: Negative for chills and fever.  HENT: Negative.   Respiratory: Negative for cough, chest tightness and wheezing.   Cardiovascular: Negative for chest pain and leg swelling.  Gastrointestinal: Negative for abdominal pain, constipation, diarrhea, nausea and vomiting.  Endocrine: Negative.  Negative for cold intolerance, heat intolerance, polydipsia, polyphagia and  polyuria.  Genitourinary: Negative for difficulty urinating and dysuria.  Musculoskeletal: Negative.        RLS symptoms at night.   Neurological: Negative for dizziness, light-headedness and numbness.  Psychiatric/Behavioral: Negative.    Per HPI unless specifically indicated above     Objective:    BP 110/73 (BP Location: Left Arm, Patient Position: Sitting, Cuff Size: Large)   Pulse 79   Temp 98.5 F (36.9 C) (Oral)   Resp 16   Ht 5\' 10"  (1.778 m)   Wt 234 lb 9.6 oz (106.4 kg)   BMI 33.66 kg/m   Wt Readings from Last 3 Encounters:  03/26/16 234 lb 9.6 oz (106.4 kg)  12/10/15 244 lb (110.7 kg)  09/15/15 246 lb (111.6 kg)    Physical Exam  Constitutional: She is oriented to person, place, and time. She appears well-developed and well-nourished.  HENT:  Head: Normocephalic and atraumatic.  Neck: Neck supple.  Cardiovascular: Normal rate, regular rhythm and normal heart sounds.  Exam reveals no gallop and no friction rub.   No murmur heard. Pulmonary/Chest: Effort normal and breath sounds normal. She has no wheezes. She exhibits no tenderness.  Abdominal: Soft. Normal appearance and bowel sounds are normal. She exhibits no distension and no mass. There is no tenderness. There is no rebound and no guarding.  Musculoskeletal: Normal range of motion. She exhibits no edema or tenderness.  Lymphadenopathy:    She has no cervical adenopathy.  Neurological:  She is alert and oriented to person, place, and time.  Skin: Skin is warm and dry.  Psychiatric: She has a normal mood and affect. Her behavior is normal. Judgment and thought content normal.   Results for orders placed or performed in visit on 03/26/16  POCT HgB A1C  Result Value Ref Range   Hemoglobin A1C 5.8       Assessment & Plan:   Problem List Items Addressed This Visit      Other   Obesity    Loss of 10lbs since last visit. Commended pt work on diet changes. Reviewed need for regular exercise outside of usual  activities.       Prediabetes - Primary    A1c doing well. Down to 5.8%. Encouraged continued weight loss. Loss of 10lbs since last visit. Reviewed diet and lifestyle changes. Encouraged 10 minutes exercise after every meal.       Relevant Orders   POCT HgB A1C (Completed)   Vitamin D deficiency    Continue vitamin D supplements. Recheck 3 mos.       Relevant Medications   Vitamin D, Ergocalciferol, (DRISDOL) 50000 units CAPS capsule    Other Visit Diagnoses    RLS (restless legs syndrome)       Add Ferritin to labs. Start requip for symptoms at night.  Recheck 4 weeks.    Relevant Medications   rOPINIRole (REQUIP) 0.25 MG tablet      Meds ordered this encounter  Medications  . rOPINIRole (REQUIP) 0.25 MG tablet    Sig: Take 1 tablet by mouth at bedtime for one week, may increase to 2 tablets at bedtime if needed.    Dispense:  60 tablet    Refill:  11  . Vitamin D, Ergocalciferol, (DRISDOL) 50000 units CAPS capsule    Sig: Take 1 capsule (50,000 Units total) by mouth every 7 (seven) days.    Dispense:  12 capsule    Refill:  1      Follow up plan: Return in about 4 weeks (around 04/23/2016), or if symptoms worsen or fail to improve.

## 2016-04-29 ENCOUNTER — Ambulatory Visit (INDEPENDENT_AMBULATORY_CARE_PROVIDER_SITE_OTHER): Payer: BC Managed Care – PPO | Admitting: *Deleted

## 2016-04-29 DIAGNOSIS — Z23 Encounter for immunization: Secondary | ICD-10-CM | POA: Diagnosis not present

## 2016-06-23 ENCOUNTER — Encounter: Payer: Self-pay | Admitting: Family Medicine

## 2016-06-23 ENCOUNTER — Ambulatory Visit (INDEPENDENT_AMBULATORY_CARE_PROVIDER_SITE_OTHER): Payer: BC Managed Care – PPO | Admitting: Family Medicine

## 2016-06-23 VITALS — BP 128/72 | HR 83 | Temp 98.5°F | Resp 16 | Ht 70.0 in | Wt 244.5 lb

## 2016-06-23 DIAGNOSIS — E669 Obesity, unspecified: Secondary | ICD-10-CM

## 2016-06-23 DIAGNOSIS — J209 Acute bronchitis, unspecified: Secondary | ICD-10-CM | POA: Diagnosis not present

## 2016-06-23 DIAGNOSIS — J0100 Acute maxillary sinusitis, unspecified: Secondary | ICD-10-CM | POA: Diagnosis not present

## 2016-06-23 MED ORDER — LORATADINE 10 MG PO TABS: 10.0000 mg | ORAL_TABLET | Freq: Every day | ORAL | 11 refills | Status: DC

## 2016-06-23 MED ORDER — FLUTICASONE PROPIONATE 50 MCG/ACT NA SUSP: 2.0000 | Freq: Every day | NASAL | 3 refills | Status: DC

## 2016-06-23 MED ORDER — HYDROCOD POLST-CPM POLST ER 10-8 MG/5ML PO SUER: 5.0000 mL | Freq: Two times a day (BID) | ORAL | 0 refills | Status: DC | PRN

## 2016-06-23 MED ORDER — AMOXICILLIN-POT CLAVULANATE 875-125 MG PO TABS: 1.0000 | ORAL_TABLET | Freq: Two times a day (BID) | ORAL | 0 refills | Status: DC

## 2016-06-23 NOTE — Assessment & Plan Note (Signed)
Obese with BMI >35, risk factors are Pre-DM, last A1c 5.8, no known HTN. Family history of heart disease, HTN, HLD. - Recent fluctuating weight +/- 10 lbs over past 6 months - Did not review entire lifestyle/diet/exercise history today due to currently here for sick visit  Plan: 1. Discussed potential future options, emphasized mainstay of treatment is lifestyle, may need [facility] 2. Patient interested in weight loss medications - asks about phentermine, advised her that I do not routinely prescribe this due to concern with complications and not ideal for chronic long-term weight management, would not recommend as first line. Today did not discuss further details of weight medications, but gave her list of 2 rx medications that can be considered Saxenda (Liraglutide), also benefit that she has Pre-DM, and Contrave (buproprion), she has been on wellbutrin before for mood, and may be good option. She will look into this and consider options, check ins coverage, follow-up 1-3 months

## 2016-06-23 NOTE — Progress Notes (Signed)
Subjective:    Patient ID: Ashley CoppAngela Fass, female    DOB: 04/20/1961, 55 y.o.   MRN: [id number]  Ashley Hale is a 55 y.o. female presenting on 06/23/2016 for Cough (can't sleep at night dry hacky cough had sore throat only feels at night and gone next morning as per pt onset 2 weeks)  Patient presents for a same day appointment.  HPI  URI SYMPTOMS / BRONCHITIS: - Reports symptoms started about 2 weeks ago with sinus congestion, pressure, and pain, does not seem to have significant nasal drainage, but now with worsening cough over past 4 days, worse at night, initially some thick green mucus now less productive but has bad coughing spells with dry heaves, vomiting x1 with very minimal output. - Tried NyQuil, Loratadine. Has allergies currently out of flonase - Recently had eye doctor appointment (2 days ago) and they were concerned with increased pressure in Right eye, which states this was very unusual for her. Her father has glaucoma. - Sick contact recently - Admits sore throat and muscles from persistent coughing - Denies fevers/chillls, sweats, nausea, diarrhea, abdominal, headache, ear pain or drainage  Obesity BMI >35 / Weight Management: - Patient wants to also discuss weight loss medications and treatment today. She is currently 244 lbs,  + 10 lbs since 03/2016, but unchanged since 11/2015. Does not have home scale readings. She has never been on weight loss medication, and was asking about phentermine, she discussed this with previous provider but was told that they did not prescribe it - Describes worsening weight gain with change of jobs  Social History  Substance Use Topics  . Smoking status: Never Smoker  . Smokeless tobacco: Not on file  . Alcohol use No    Review of Systems Per HPI unless specifically indicated above     Objective:    BP 128/72   Pulse 83   Temp 98.5 F (36.9 C) (Oral)   Resp 16   Ht 5\' 10"  (1.778 m)   Wt 244 lb 8 oz (110.9 kg)   SpO2 99%    BMI 35.08 kg/m   Wt Readings from Last 3 Encounters:  06/23/16 244 lb 8 oz (110.9 kg)  03/26/16 234 lb 9.6 oz (106.4 kg)  12/10/15 244 lb (110.7 kg)    Physical Exam  Constitutional: She appears well-developed and well-nourished. No distress.  Well-appearing, comfortable, cooperative, obese  HENT:  Head: Normocephalic and atraumatic.  Mild tender maxillary sinuses bilateral. Nares with mild turbinate edema with deeper congestion without purulence. Bilateral TMs clear without erythema, effusion or bulging. Oropharynx clear without erythema, exudates, edema or asymmetry.  Mallampati Score 4 - Soft palate is not visible at all   Eyes: Conjunctivae are normal. Right eye exhibits no discharge. Left eye exhibits no discharge.  Cardiovascular: Normal rate and intact distal pulses.   Pulmonary/Chest: Effort normal and breath sounds normal. No respiratory distress. She has no wheezes. She has no rales.  Neurological: She is alert.  Skin: Skin is warm and dry. No rash noted. She is not diaphoretic.  Psychiatric: Her behavior is normal.  Nursing note and vitals reviewed.  Results for orders placed or performed in visit on 03/26/16  POCT HgB A1C  Result Value Ref Range   Hemoglobin A1C 5.8       Assessment & Plan:   Problem List Items Addressed This Visit    Obesity (BMI 35.0-39.9 without comorbidity)    Obese with BMI >35, risk factors are Pre-DM, last A1c 5.8,  no known HTN. Family history of heart disease, HTN, HLD. - Recent fluctuating weight +/- 10 lbs over past 6 months - Did not review entire lifestyle/diet/exercise history today due to currently here for sick visit  Plan: 1. Discussed potential future options, emphasized mainstay of treatment is lifestyle, may need nutritional referral to [facility] 2. Patient interested in weight loss medications - asks about phentermine, advised her that I do not routinely prescribe this due to concern with complications and not ideal  for chronic long-term weight management, would not recommend as first line. Today did not discuss further details of weight medications, but gave her list of 2 rx medications that can be considered Saxenda (Liraglutide), also benefit that she has Pre-DM, and Contrave (buproprion), she has been on wellbutrin before for mood, and may be good option. She will look into this and consider options, check ins coverage, follow-up 1-3 months       Other Visit Diagnoses    Acute non-recurrent maxillary sinusitis    -  Primary  Consistent with acute sinusitis, likely initially viral URI vs allergic rhinitis component with worsening now cough, concern with sinus pain and pressure, also recent elevated eye pressure at eye doctor.  Plan: 1. Start Augmentin 875-125mg  PO BID x 10 days - Resume Loratadine (Claritin)  daily and Start Flonase 2 sprays in each nostril daily for next 4-6 weeks, then may stop and use seasonally or as needed 2. Supportive care with nasal saline OTC, hydration 3. Return criteria reviewed     Relevant Medications   amoxicillin-clavulanate (AUGMENTIN) 875-125 MG tablet   loratadine (CLARITIN) 10 MG tablet   chlorpheniramine-HYDROcodone (TUSSIONEX PENNKINETIC ER) 10-8 MG/5ML SUER   fluticasone (FLONASE) 50 MCG/ACT nasal spray   Acute bronchitis, unspecified organism     - See above sinusitis - Also given rx Tussionex for worsening cough keeping awake, reviewed risks of this medication with codeine, patient has tolerated before, and aware of risks. - Mucinex-DM 7 days - May try old albuterol inhaler at home     Relevant Medications   chlorpheniramine-HYDROcodone (TUSSIONEX PENNKINETIC ER) 10-8 MG/5ML SUER      Meds ordered this encounter  Medications  . amoxicillin-clavulanate (AUGMENTIN) 875-125 MG tablet    Sig: Take 1 tablet by mouth 2 (two) times daily.    Dispense:  20 tablet    Refill:  0  . loratadine (CLARITIN) 10 MG tablet    Sig: Take 1 tablet (10 mg total)  by mouth daily. Use for 4-6 weeks then stop, and use as needed or seasonally    Dispense:  30 tablet    Refill:  11  . chlorpheniramine-HYDROcodone (TUSSIONEX PENNKINETIC ER) 10-8 MG/5ML SUER    Sig: Take 5 mLs by mouth every 12 (twelve) hours as needed for cough.    Dispense:  115 mL    Refill:  0  . fluticasone (FLONASE) 50 MCG/ACT nasal spray    Sig: Place 2 sprays into both nostrils daily. Use for 4-6 weeks then stop and use seasonally or as needed.    Dispense:  16 g    Refill:  3      Follow up plan: Return in about 4 weeks (around 07/21/2016) for weight management.  Saralyn Pilar, DO [facility] 06/23/2016, 9:11 AM

## 2016-06-23 NOTE — Patient Instructions (Signed)
Thank you for coming in to clinic today.  It sounds like you have a Sinusitis (Bacterial Infection) - this most likely started as an Upper Respiratory Virus that has settled into an infection. Allergies can also cause this. - Start Augmentin 1 pill twice daily (breakfast and dinner, with food and plenty of water) for 10 days, complete entire course, do not stop early even if feeling better - Resume Loratadine (Claritin) 10mg  daily and Start Flonase 2 sprays in each nostril daily for next 4-6 weeks, then you may stop and use seasonally or as needed - Take Mucinex-DM for cough every 4-6 hours follow instructions - take for 7 days then stop - Use Tussionex as needed for cough - Recommend to may also try OTC Nasal Saline spray multiple times a day to help flush out congestion and clear sinuses - Improve hydration by drinking plenty of clear fluids (water, gatorade) to reduce secretions and thin congestion - Congestion draining down throat can cause irritation. May try warm herbal tea with honey, cough drops - Can take Tylenol or Ibuprofen as needed for fevers  Eye pressure may be related to sinus pressure, follow-up with Ophthalmology  For future discussion on weight loss, please look into the following possible options, or bring other specific questions for discussion.  Saxenda (liraglutide) diabetic injectable medication, indicated for chronic weight management  Contrave (Bupropion and naltrexone) includes anti-depressant medication buproprion which is wellbutrin, used for variety of things including smoking cessation.   Please schedule a follow-up appointment with Dr. Althea CharonKaramalegos in 1 to 3 months follow-up Weight Management  If you have any other questions or concerns, please feel free to call the clinic or send a message through MyChart. You may also schedule an earlier appointment if necessary.  Saralyn PilarAlexander Oanh Devivo, DO [facility]

## 2016-08-17 ENCOUNTER — Other Ambulatory Visit: Payer: Self-pay | Admitting: Family Medicine

## 2016-08-17 DIAGNOSIS — K219 Gastro-esophageal reflux disease without esophagitis: Secondary | ICD-10-CM

## 2016-08-17 MED ORDER — RANITIDINE HCL 150 MG PO CAPS: 150.0000 mg | ORAL_CAPSULE | Freq: Two times a day (BID) | ORAL | 1 refills | Status: DC

## 2016-08-23 ENCOUNTER — Telehealth: Payer: Self-pay

## 2016-08-23 DIAGNOSIS — K219 Gastro-esophageal reflux disease without esophagitis: Secondary | ICD-10-CM

## 2016-08-23 MED ORDER — FAMOTIDINE 40 MG PO TABS: 40.0000 mg | ORAL_TABLET | Freq: Two times a day (BID) | ORAL | 5 refills | Status: DC

## 2016-08-23 NOTE — Telephone Encounter (Signed)
Switched to Famotidine 40mg  BID, #60 +5 refills, discontinued Ranitidine.  Ashley PilarAlexander Karamalegos, DO [facility] 08/23/2016, 10:15 AM

## 2016-08-23 NOTE — Telephone Encounter (Signed)
Patient called her insurance because her ranitidine 150mg  had increase in price.  The insurance told her that is you would prescribe Famotidine bid it would be cheaper for her.  Please send to pharm in chart.

## 2016-12-14 ENCOUNTER — Other Ambulatory Visit: Payer: Self-pay

## 2016-12-14 DIAGNOSIS — N951 Menopausal and female climacteric states: Secondary | ICD-10-CM

## 2016-12-14 MED ORDER — PAROXETINE HCL 20 MG PO TABS: 20.0000 mg | ORAL_TABLET | Freq: Every day | ORAL | 3 refills | Status: DC

## 2016-12-14 NOTE — Telephone Encounter (Signed)
Last ov 12/10/15 Last filled 06/23/16 Please review. Thank you. sd

## 2017-01-04 ENCOUNTER — Other Ambulatory Visit: Payer: Self-pay

## 2017-01-04 DIAGNOSIS — R7303 Prediabetes: Secondary | ICD-10-CM

## 2017-01-04 MED ORDER — METFORMIN HCL ER 500 MG PO TB24: 500.0000 mg | ORAL_TABLET | Freq: Every day | ORAL | 0 refills | Status: DC

## 2017-02-21 ENCOUNTER — Ambulatory Visit (INDEPENDENT_AMBULATORY_CARE_PROVIDER_SITE_OTHER): Payer: BC Managed Care – PPO | Admitting: Family Medicine

## 2017-02-21 ENCOUNTER — Encounter: Payer: Self-pay | Admitting: Family Medicine

## 2017-02-21 ENCOUNTER — Other Ambulatory Visit: Payer: Self-pay | Admitting: Family Medicine

## 2017-02-21 VITALS — BP 136/68 | HR 89 | Temp 98.5°F | Resp 16 | Ht 70.0 in | Wt 250.0 lb

## 2017-02-21 DIAGNOSIS — R635 Abnormal weight gain: Secondary | ICD-10-CM

## 2017-02-21 DIAGNOSIS — E782 Mixed hyperlipidemia: Secondary | ICD-10-CM

## 2017-02-21 DIAGNOSIS — R7303 Prediabetes: Secondary | ICD-10-CM

## 2017-02-21 DIAGNOSIS — E669 Obesity, unspecified: Secondary | ICD-10-CM

## 2017-02-21 DIAGNOSIS — E559 Vitamin D deficiency, unspecified: Secondary | ICD-10-CM

## 2017-02-21 DIAGNOSIS — R29818 Other symptoms and signs involving the nervous system: Secondary | ICD-10-CM | POA: Diagnosis not present

## 2017-02-21 DIAGNOSIS — E785 Hyperlipidemia, unspecified: Secondary | ICD-10-CM | POA: Insufficient documentation

## 2017-02-21 DIAGNOSIS — G4733 Obstructive sleep apnea (adult) (pediatric): Secondary | ICD-10-CM | POA: Insufficient documentation

## 2017-02-21 DIAGNOSIS — Z0000 Encounter for general adult medical examination without abnormal findings: Secondary | ICD-10-CM

## 2017-02-21 LAB — POCT GLYCOSYLATED HEMOGLOBIN (HGB A1C): Hemoglobin A1C: 6.4 — AB (ref ?–5.7)

## 2017-02-21 MED ORDER — METFORMIN HCL ER 500 MG PO TB24: 1000.0000 mg | ORAL_TABLET | Freq: Every day | ORAL | 3 refills | Status: DC

## 2017-02-21 NOTE — Patient Instructions (Addendum)
Thank you for coming to the clinic today.  1.  I do think you most likely have Obstructive Sleep Apnea (OSA) - and would benefit from a Sleep STudy, next step is to contact insurance company and find out which route to go - Sleep Center (sleep study) - Polysomnography - In home sleep study  CALL US as soon as you find out how to proceed with sleep study, and we can order it.  Epworth Sleepiness Scale - 19 STOP-Bang OSA - 6 Neck Circumference 18" BMI 35 Witnessed apnea  Find out cost / coverage and preference, and let me know  2. Pre-Diabetes A1c is increased, from 5.8 to 6.4, concern with potential risk of future diabetes - Increase Metformin XR 500mg  tablets - take TWO at once in morning every day, with food. - Try to work on improving diet, with lower carb, low sugar, more water, less soda/tea  Next options for sugar would be   Call insurance to check cost/coverage of the following  1. Bydureon BCise (Exenatide ER) - once weekly - this is my preference, very good medicine well tolerated, less side effects of nausea, upset stomach. No dose changes. Cost and coverage is the problem, but we may be able to get it with the coupon card  2. Trulicity (Dulaglutide) - once weekly - this is very good one, usually one of my top choices as well, two doses, 0.75 (likely we would start) and 1.5 max dose. We can use coupon card here too  3. Victoza (Liraglutide) - once DAILY - 3 dose changes 0.6, 1.2 and 1.8, side effects nausea, upset stomach higher on this one but it is still very effective medicine  4 Saxenda  5. Farxiga (dapagliflozin) - oral tablet, once daily lower sugar by urinating out excess sugar, has weight loss component  Please schedule a Follow-up Appointment to: Return in about 3 months (around 05/24/2017) for Annual Physical.  If you have any other questions or concerns, please feel free to call the clinic or send a message through MyChart. You may also schedule an earlier  appointment if necessary.  Additionally, you may be receiving a survey about your experience at our clinic within a few days to 1 week by e-mail or mail. We value your feedback.  Saralyn PilarAlexander Dontrel Smethers, DO [facility]

## 2017-02-21 NOTE — Assessment & Plan Note (Signed)
Continued wt gain +16 lbs in 1 year Worsening PreDM A1c control Concern with likely undiagnosed OSA, proceeding now with work-up in home PS[facility], likely need to control before can really lose weight with lifestyle changes that also need to be implemented Follow-up 3 months labs, discuss additional med options for A1c and wt

## 2017-02-21 NOTE — Progress Notes (Signed)
Subjective:    Patient ID: Ashley Hale, female    DOB: 1960/10/21, 56 y.o.   MRN: [id number]  Ashley Hale is a 56 y.o. female presenting on 02/21/2017 for Sleep Apnea (needs some medication refill)   HPI   Pre-Diabetes / Obesity BMI >35 Reports concerns with weight gain, +16 lbs in about 1 year CBGs: Not checking CBG Meds: Metformin XR 500mg  every OTHER day, admits symptoms of feeling a little weak or not normal on days she skips dose Reports good compliance. 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  Additional complaint, post-menopausal hot flashes, >5 years s/p menopause, has intermittent hot flashes, trial on Paxil in past with good results, then gradually worsening, less improvement over past 6-12 months.  Suspected Sleep Apnea - Reports significant concerns with OSA for a while now, new evaluation today, urged by her family to seek potential sleep study. Complaints of very loud snoring, worse with weight gain. Also known history of witnessed apnea events in past by family. - She admits not waking up rested, often feels sleepy and tired during daytime (see ESS score below) - Works in International aid/development worker, often dyspnea with significant exertion with feeling tired  Epworth Sleepiness Scale Total Score: 19 - High Score = Severe Sitting and reading - 3 Watching TV - 3 Sitting inactive in a public place - 3 As a passenger in a car for an hour without a break - 3 Lying down to rest in the afternoon when circumstances permit - 3 Sitting and talking to someone - 1 Sitting quietly after a lunch  without alcohol - 2 In a car, while stopped for a few minutes in traffic - 1  STOP-Bang OSA scoring Snoring  yes  Tiredness  yes  Observed apneas  yes  Pressure HTN  no  BMI > 35 kg/m2  yes  Age > 50   yes  Neck (female >17 in; Female >16 in)   yes  Gender female  no  OSA risk low (0-2)  OSA risk intermediate (3-4)  OSA risk high (5+)  Total: 6 (high risk)   Social History  Substance Use Topics  . Smoking status: Never Smoker  . Smokeless tobacco: Never Used  . Alcohol use No    Review of Systems Per HPI unless specifically indicated above     Objective:    BP 136/68   Pulse 89   Temp 98.5 F (36.9 C) (Oral)   Resp 16   Ht 5\' 10"  (1.778 m)   Wt 250 lb (113.4 kg)   BMI 35.87 kg/m   Wt Readings from Last 3 Encounters:  02/21/17 250 lb (113.4 kg)  06/23/16 244 lb 8 oz (110.9 kg)  03/26/16 234 lb 9.6 oz (106.4 kg)    Physical Exam  Constitutional: She is oriented to person, place, and time. She appears well-developed and well-nourished. No distress.  Well-appearing, comfortable, cooperative, obese  HENT:  Head: Normocephalic and atraumatic.  Mouth/Throat: Oropharynx is clear and moist.  Frontal / maxillary sinuses non-tender. Nares patent without purulence or edema.  Mallampati Score 3 - Visualization of only base of uvula  Oropharynx clear without erythema, exudates, edema or asymmetry.  Eyes: Conjunctivae are normal. Right eye exhibits no discharge. Left eye exhibits no discharge.  Neck: Normal range of motion. Neck supple. No thyromegaly present.  Neck circumference 18"  Cardiovascular: Normal rate, regular rhythm, normal heart sounds and intact distal pulses.   No murmur heard. Pulmonary/Chest: Effort normal and breath sounds normal. No respiratory distress. She has no wheezes. She has no rales.  Musculoskeletal: Normal range of motion. She exhibits no edema.  Lymphadenopathy:    She has no cervical adenopathy.  Neurological: She is alert and oriented to  person, place, and time.  Skin: Skin is warm and dry. No rash noted. She is not diaphoretic. No erythema.  Psychiatric: She has a normal mood and affect. Her behavior is normal.  Well groomed, good eye contact, normal speech and thoughts  Nursing note and vitals reviewed.  Results for orders placed or performed in visit on 02/21/17  POCT HgB A1C  Result Value Ref Range   Hemoglobin A1C 6.4 (A) 5.7    Recent Labs  03/26/16 0807 02/21/17 1306  HGBA1C 5.8 6.4*       Assessment & Plan:   Problem List Items Addressed This Visit    Suspected sleep apnea    New problem with chronic clinical concern for suspected obstructive sleep apnea given reported symptoms with witnessed apnea, snoring and sleep disturbance, fatigue, excessive sleepiness. - Screening: ESS score 19 / STOP-Bang Score 6 (both high risk) - Neck Circumference: 18" - Co-morbidities: HTN, Obesity, Pre DM  Plan: 1. Discussion on initial diagnosis and testing for OSA, risk factors, management, complications 2. Agree to proceed with sleep study testing based on clinical concerns - advised patient to first contact insurance to determine which route for sleep study, Sleep Center vs In Home, notify office once determined, and will place order at that time to initiate testing  Notified with update today 02/21/17 - patient's insurance would cover either, agree to start with in home sleep study through [facility] company, next step would be CPAP titration if indicated, orders placed in Epic and community message sent to [employer] staff to make arrangements      Relevant Orders   For home use only DME continuous positive airway pressure (CPAP)   Prediabetes - Primary    Worsening control Pre-DM with A1c 6.4 (increased from prior 5.8) Concern with obesity, HTN, HLD, OSA undiagnosed/treated currently  Plan:  1. Increase Metformin XR 500mg  qod to 1000mg  daily - new rx sent, counseling on potential GI intolerance, take with  food - Asked to check cost/coverage on GLP1/SGLT2 agents 2. Encourage improved lifestyle - emphasized limit fast food, low carb, low sugar diet, reduce portion size, limit soda/tea inc water, if needed may add more regular exercise in addition to regular work activity 3. Follow-up 3 months for annual phys + labs for A1c  UPDATE 02/21/17, received feedback regarding ins cost/coverage for GLP1/SGLT2. None of these agents are preferred or covered by report, but would require step therapy or PA, options include GLP1 Trulicity, Victoza, or SGLT2 [place].      Relevant Medications   metFORMIN (GLUCOPHAGE-XR) 500 MG 24 hr tablet   Other Relevant Orders   POCT HgB A1C (Completed)   Obesity (BMI 35.0-39.9 without comorbidity)    Continued wt gain +16 lbs in 1 year Worsening PreDM A1c control Concern with likely undiagnosed OSA, proceeding now with work-up in home PSG, likely need to control before can really lose weight  with lifestyle changes that also need to be implemented Follow-up 3 months labs, discuss additional med options for A1c and wt      Relevant Medications   metFORMIN (GLUCOPHAGE-XR) 500 MG 24 hr tablet      Meds ordered this encounter  Medications  . metFORMIN (GLUCOPHAGE-XR) 500 MG 24 hr tablet    Sig: Take 2 tablets (1,000 mg total) by mouth daily with breakfast.    Dispense:  180 tablet    Refill:  3    Follow up plan: Return in about 3 months (around 05/24/2017) for Annual Physical.  Saralyn PilarAlexander Omelia Marquart, DO [facility] 02/21/2017, 11:32 PM

## 2017-02-21 NOTE — Assessment & Plan Note (Addendum)
New problem with chronic clinical concern for suspected obstructive sleep apnea given reported symptoms with witnessed apnea, snoring and sleep disturbance, fatigue, excessive sleepiness. - Screening: ESS score 19 / STOP-Bang Score 6 (both high risk) - Neck Circumference: 18" - Co-morbidities: HTN, Obesity, Pre DM  Plan: 1. Discussion on initial diagnosis and testing for OSA, risk factors, management, complications 2. Agree to proceed with sleep study testing based on clinical concerns - advised patient to first contact insurance to determine which route for sleep study, Sleep Center vs In Home, notify office once determined, and will place order at that time to initiate testing  Notified with update today 02/21/17 - patient's insurance would cover either, agree to start with in home sleep study through [facility] company, next step would be CPAP titration if indicated, orders placed in Epic and community message sent to [facility] staff to make arrangements

## 2017-02-21 NOTE — Assessment & Plan Note (Addendum)
Worsening control Pre-DM with A1c 6.4 (increased from prior 5.8) Concern with obesity, HTN, HLD, OSA undiagnosed/treated currently  Plan:  1. Increase Metformin XR 500mg  qod to 1000mg  daily - new rx sent, counseling on potential GI intolerance, take with food - Asked to check cost/coverage on GLP1/SGLT2 agents 2. Encourage improved lifestyle - emphasized limit fast food, low carb, low sugar diet, reduce portion size, limit soda/tea inc water, if needed may add more regular exercise in addition to regular work activity 3. Follow-up 3 months for annual phys + labs for A1c  UPDATE 02/21/17, received feedback regarding ins cost/coverage for GLP1/SGLT2. None of these agents are preferred or covered by report, but would require step therapy or PA, options include GLP1 Trulicity, Victoza, or SGLT2 [place].

## 2017-04-11 ENCOUNTER — Other Ambulatory Visit: Payer: Self-pay | Admitting: Family Medicine

## 2017-04-11 DIAGNOSIS — N951 Menopausal and female climacteric states: Secondary | ICD-10-CM

## 2017-04-22 ENCOUNTER — Other Ambulatory Visit: Payer: Self-pay

## 2017-04-22 DIAGNOSIS — G2581 Restless legs syndrome: Secondary | ICD-10-CM

## 2017-04-22 MED ORDER — ROPINIROLE HCL 0.25 MG PO TABS: 0.2500 mg | ORAL_TABLET | Freq: Every evening | ORAL | 11 refills | Status: DC | PRN

## 2017-05-10 ENCOUNTER — Other Ambulatory Visit: Payer: Self-pay | Admitting: Family Medicine

## 2017-05-10 DIAGNOSIS — Z1231 Encounter for screening mammogram for malignant neoplasm of breast: Secondary | ICD-10-CM

## 2017-05-24 ENCOUNTER — Ambulatory Visit
Admission: RE | Admit: 2017-05-24 | Discharge: 2017-05-24 | Disposition: A | Discharge status: Home / Self Care | Payer: BC Managed Care – PPO | Source: Ambulatory Visit | Attending: Family Medicine | Admitting: Family Medicine

## 2017-05-24 DIAGNOSIS — Z1231 Encounter for screening mammogram for malignant neoplasm of breast: Secondary | ICD-10-CM

## 2017-05-25 ENCOUNTER — Other Ambulatory Visit: Payer: BC Managed Care – PPO

## 2017-05-25 DIAGNOSIS — Z0000 Encounter for general adult medical examination without abnormal findings: Secondary | ICD-10-CM

## 2017-05-25 DIAGNOSIS — E559 Vitamin D deficiency, unspecified: Secondary | ICD-10-CM

## 2017-05-25 DIAGNOSIS — R7303 Prediabetes: Secondary | ICD-10-CM

## 2017-05-25 DIAGNOSIS — R635 Abnormal weight gain: Secondary | ICD-10-CM

## 2017-05-25 DIAGNOSIS — E782 Mixed hyperlipidemia: Secondary | ICD-10-CM

## 2017-05-25 DIAGNOSIS — E669 Obesity, unspecified: Secondary | ICD-10-CM

## 2017-05-26 ENCOUNTER — Other Ambulatory Visit: Payer: BC Managed Care – PPO

## 2017-05-26 LAB — CBC WITH DIFFERENTIAL/PLATELET
BASOS PCT: 0.6 %
Basophils Absolute: 30 cells/uL (ref 0–200)
EOS PCT: 3.2 %
Eosinophils Absolute: 160 cells/uL (ref 15–500)
HCT: 38.7 % (ref 35.0–45.0)
HEMOGLOBIN: 12.8 g/dL (ref 11.7–15.5)
Lymphs Abs: 1585 cells/uL (ref 850–3900)
MCH: 28.6 pg (ref 27.0–33.0)
MCHC: 33.1 g/dL (ref 32.0–36.0)
MCV: 86.4 fL (ref 80.0–100.0)
MONOS PCT: 10.4 %
MPV: 10.3 fL (ref 7.5–12.5)
NEUTROS ABS: 2705 {cells}/uL (ref 1500–7800)
Neutrophils Relative %: 54.1 %
PLATELETS: 286 10*3/uL (ref 140–400)
RBC: 4.48 10*6/uL (ref 3.80–5.10)
RDW: 12.9 % (ref 11.0–15.0)
TOTAL LYMPHOCYTE: 31.7 %
WBC mixed population: 520 cells/uL (ref 200–950)
WBC: 5 10*3/uL (ref 3.8–10.8)

## 2017-05-26 LAB — COMPLETE METABOLIC PANEL WITH GFR
AG Ratio: 1.4 (calc) (ref 1.0–2.5)
ALBUMIN MSPROF: 3.9 g/dL (ref 3.6–5.1)
ALT: 19 U/L (ref 6–29)
AST: 17 U/L (ref 10–35)
Alkaline phosphatase (APISO): 76 U/L (ref 33–130)
BILIRUBIN TOTAL: 0.4 mg/dL (ref 0.2–1.2)
BUN: 15 mg/dL (ref 7–25)
CHLORIDE: 104 mmol/L (ref 98–110)
CO2: 28 mmol/L (ref 20–32)
CREATININE: 0.98 mg/dL (ref 0.50–1.05)
Calcium: 9.1 mg/dL (ref 8.6–10.4)
GFR, EST AFRICAN AMERICAN: 74 mL/min/{1.73_m2} (ref >=60)
GFR, Est Non African American: 64 mL/min/{1.73_m2} (ref >=60)
GLUCOSE: 90 mg/dL (ref 65–99)
Globulin: 2.8 g/dL (calc) (ref 1.9–3.7)
Potassium: 4.6 mmol/L (ref 3.5–5.3)
Sodium: 139 mmol/L (ref 135–146)
TOTAL PROTEIN: 6.7 g/dL (ref 6.1–8.1)

## 2017-05-26 LAB — HEMOGLOBIN A1C
Hgb A1c MFr Bld: 5.9 % of total Hgb — ABNORMAL HIGH (ref <5.7)
MEAN PLASMA GLUCOSE: 123 (calc)
eAG (mmol/L): 6.8 (calc)

## 2017-05-26 LAB — LIPID PANEL
CHOL/HDL RATIO: 3.8 (calc) (ref <5.0)
CHOLESTEROL: 202 mg/dL — AB (ref <200)
HDL: 53 mg/dL (ref >50)
LDL CHOLESTEROL (CALC): 127 mg/dL — AB
NON-HDL CHOLESTEROL (CALC): 149 mg/dL — AB (ref <130)
Triglycerides: 114 mg/dL (ref <150)

## 2017-05-26 LAB — TSH: TSH: 1.4 mIU/L (ref 0.40–4.50)

## 2017-05-26 LAB — VITAMIN D 25 HYDROXY (VIT D DEFICIENCY, FRACTURES): Vit D, 25-Hydroxy: 18 ng/mL — ABNORMAL LOW (ref 30–100)

## 2017-05-27 ENCOUNTER — Encounter: Payer: Self-pay | Admitting: Family Medicine

## 2017-05-27 ENCOUNTER — Ambulatory Visit (INDEPENDENT_AMBULATORY_CARE_PROVIDER_SITE_OTHER): Payer: BC Managed Care – PPO | Admitting: Family Medicine

## 2017-05-27 VITALS — BP 117/69 | HR 82 | Temp 99.0°F | Resp 16 | Ht 70.0 in | Wt 253.0 lb

## 2017-05-27 DIAGNOSIS — E559 Vitamin D deficiency, unspecified: Secondary | ICD-10-CM | POA: Diagnosis not present

## 2017-05-27 DIAGNOSIS — R7303 Prediabetes: Secondary | ICD-10-CM

## 2017-05-27 DIAGNOSIS — G4733 Obstructive sleep apnea (adult) (pediatric): Secondary | ICD-10-CM | POA: Diagnosis not present

## 2017-05-27 DIAGNOSIS — N951 Menopausal and female climacteric states: Secondary | ICD-10-CM | POA: Diagnosis not present

## 2017-05-27 DIAGNOSIS — Z0000 Encounter for general adult medical examination without abnormal findings: Secondary | ICD-10-CM | POA: Diagnosis not present

## 2017-05-27 DIAGNOSIS — E782 Mixed hyperlipidemia: Secondary | ICD-10-CM | POA: Diagnosis not present

## 2017-05-27 NOTE — Assessment & Plan Note (Signed)
Low Vit D 18, had improved before No longer on supplement Start OTC Vitamin D3 5,000 iu daily for 12 weeks then reduce to OTC Vitamin D3 2,000 iu daily maintenance

## 2017-05-27 NOTE — Assessment & Plan Note (Signed)
Mostlycontrolled cholesterol on lifestyle still poor regular exercise Last lipid panel 05/2017 Calculated ASCVD 10 yr risk score 2.9%  Plan: 1. Discussed ASCVD risk - defer statin for now 2. Future consider ASA if elevated risk 3. Encourage improved lifestyle - low carb/cholesterol, reduce portion size, continue improving regular exercise 4. Follow-up lipids yearly

## 2017-05-27 NOTE — Assessment & Plan Note (Signed)
Improved on black cohosh herbal supplement Still has hot flashes at night periodically Failed Paxil, Gabapentin in past

## 2017-05-27 NOTE — Assessment & Plan Note (Signed)
Remains uncontrolled waiting on initiating CPAP, has not received machine yet Severe OSA on last PSG Last PSG 2018, per [facility], and [facility] Suspect OSA control will likely help with wt loss in future

## 2017-05-27 NOTE — Progress Notes (Signed)
Subjective:    Patient ID: Ashley Hale, female    DOB: Jul 02, 1961, 56 y.o.   MRN: [id number]  Ashley Hale is a 56 y.o. female presenting on 05/27/2017 for Annual Exam; Pre-Diabetes; and Obesity   HPI   Here for Annual Physical and Lab Review  Pre-Diabetes / Obesity BMI >36 Reports concerns with weight gain still attributes some to diet osa and inactivity, also admits some difficulty with presumed hormonal changes post menopausal, seems now gaining more weight compared to previous. Also reports in past she has taken Phentermine and other wt loss meds with good results, more recently in past 1 year has tried without good results - Asking about Saxenda or similar rx today - Interval update with improved A1c on new diet changes and med CBGs: Not checking CBG Meds: Metformin XR 1000mg  daily (x 2 of 500mg  tabs) - better adherence now Reports good compliance. Tolerating well w/o side-effects Currently not on ACEi / ARB Lifestyle: - Weight gained +10 lbs in 1 year by current measurement and about 3-4 lbs in 3 months. 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  Postmenopausal Vasomotor Symptoms / Hot Flashes Additional complaint, post-menopausal hot flashes, >5 years s/p menopause, has intermittent hot flashes, trial on Paxil in past with good results, then gradually worsening, less improvement over past 6-12 months. - Since last visit she has tried [employer] with some good results  OSA, Severe, not on CPAP yet - Since last visit over past 3 months, she has completed her PSG sleep studies and CPAP titration through [facility] and has seen [facility], last 05/17/17, she has Severe OSA and was rx CPAP machine awaiting to receive  this, see prior notes for background information. - Interval update with not received CPAP yet, was told cost $300 ship fedex, and $80 monthly for few months - Today patient reports eager to start CPAP, never on before, anticipates she will use the face mask - Still has episodes of difficulty sleeping at night - Wakes up not rested still, no change - See prior visit with screening  HYPERLIPIDEMIA: - Reports no concerns. Last lipid panel 05/2017, mostly controlled mildly elevated LDL - Not on Statin cholesterol medicine  Vitamin D Deficiency  - Lab result was 18, low on last check. Not on vitamin D supplement  Health Maintenance: - UTD Flu Shot 04/18/17 - Breast CA Screening: UTD for mammogram screening. Last mammogram result 05/24/17 negative done at [facility].   Depression screen [facility] 2/9 05/27/2017 09/15/2015 05/05/2015  Decreased Interest 0 0 0  Down, Depressed, Hopeless 0 0 0  PHQ - 2 Score 0 0 0    Past Medical History:  Diagnosis Date  . GERD (gastroesophageal reflux disease)   . IBS (irritable bowel syndrome)   . Obesity    Past Surgical History:  Procedure Laterality Date  . ABDOMINAL HYSTERECTOMY    . BACK SURGERY  1992   Social History   Socioeconomic History  . Marital status: Married    Spouse name: Not on file  . Number of children: Not on file  . Years of education: Not on file  . Highest education level: Not on file  Social Needs  . Financial resource strain: Not on file  . Food insecurity - worry:  Not on file  . Food insecurity - inability: Not on file  . Transportation needs - medical: Not on file  . Transportation needs - non-medical: Not on file  Occupational History  . Not on file  Tobacco Use  . Smoking status: Never Smoker  . Smokeless tobacco: Never Used  Substance and Sexual Activity  . Alcohol use: No    Alcohol/week: 0.0 oz  . Drug use: No  . Sexual activity: Not on file  Other Topics Concern  . Not on file  Social History  Narrative  . Not on file   Family History  Problem Relation Age of Onset  . Heart disease Mother   . Hypertension Mother   . Hyperlipidemia Mother   . Heart disease Father   . Glaucoma Father   . Breast cancer Neg Hx    Current Outpatient Medications on File Prior to Visit  Medication Sig  . albuterol (PROAIR HFA) 108 (90 Base) MCG/ACT inhaler Inhale 2 puffs into the lungs every 6 (six) hours as needed for wheezing or shortness of breath.  Marland Kitchen BLACK COHOSH EXTRACT PO Take daily by mouth.  . famotidine (PEPCID) 40 MG tablet Take 1 tablet (40 mg total) by mouth 2 (two) times daily.  . fluticasone (FLONASE) 50 MCG/ACT nasal spray Place 2 sprays into both nostrils daily. Use for 4-6 weeks then stop and use seasonally or as needed.  . furosemide (LASIX) 40 MG tablet Take 1 tablet by mouth daily.  Marland Kitchen loratadine (CLARITIN) 10 MG tablet Take 1 tablet (10 mg total) by mouth daily. Use for 4-6 weeks then stop, and use as needed or seasonally  . metFORMIN (GLUCOPHAGE-XR) 500 MG 24 hr tablet Take 2 tablets (1,000 mg total) by mouth daily with breakfast.  . Omeprazole 20 MG TBEC Take 1 capsule by mouth daily.  Marland Kitchen PARoxetine (PAXIL) 20 MG tablet TAKE 1 TABLET BY MOUTH ONCE DAILY  . rOPINIRole (REQUIP) 0.25 MG tablet Take 1-2 tablets (0.25-0.5 mg total) by mouth at bedtime as needed (Restless legs).   No current facility-administered medications on file prior to visit.     Review of Systems  Constitutional: Positive for unexpected weight change (Abnormal weight gain). Negative for activity change, appetite change, chills, diaphoresis, fatigue and fever.  HENT: Negative for congestion, hearing loss, postnasal drip and sinus pressure.   Eyes: Negative for visual disturbance.  Respiratory: Positive for apnea (dx OSA, awaiting CPAP). Negative for cough, choking, chest tightness, shortness of breath and wheezing.   Cardiovascular: Negative for chest pain, palpitations and leg swelling.  Gastrointestinal:  Negative for abdominal pain, anal bleeding, blood in stool, constipation, diarrhea, nausea and vomiting.  Endocrine: Negative for cold intolerance and polyuria.       Hot flashes, improved nightly on Black Cohosh  Genitourinary: Negative for decreased urine volume, difficulty urinating, dysuria, frequency, hematuria and urgency.  Musculoskeletal: Negative for arthralgias, back pain and neck pain.  Skin: Negative for rash.  Allergic/Immunologic: Negative for environmental allergies.  Neurological: Negative for dizziness, weakness, light-headedness, numbness and headaches.  Hematological: Negative for adenopathy.  Psychiatric/Behavioral: Negative for behavioral problems, dysphoric mood and sleep disturbance. The patient is not nervous/anxious.    Per HPI unless specifically indicated above     Objective:    BP 117/69   Pulse 82   Temp 99 F (37.2 C) (Oral)   Resp 16   Ht 5\' 10"  (1.778 m)   Wt 253 lb (114.8 kg)   BMI 36.30 kg/m   Wt Readings  from Last 3 Encounters:  05/27/17 253 lb (114.8 kg)  02/21/17 250 lb (113.4 kg)  06/23/16 244 lb 8 oz (110.9 kg)    Physical Exam  Constitutional: She is oriented to person, place, and time. She appears well-developed and well-nourished. No distress.  Well-appearing, comfortable, cooperative, obese  HENT:  Head: Normocephalic and atraumatic.  Mouth/Throat: Oropharynx is clear and moist.  Frontal / maxillary sinuses non-tender. Nares patent without purulence or edema. Bilateral TMs clear without erythema, effusion or bulging. Oropharynx clear without erythema, exudates, edema or asymmetry.  Eyes: Conjunctivae and EOM are normal. Pupils are equal, round, and reactive to light. Right eye exhibits no discharge. Left eye exhibits no discharge.  Neck: Normal range of motion. Neck supple. No thyromegaly present.  No carotid bruits  Cardiovascular: Normal rate, regular rhythm, normal heart sounds and intact distal pulses.  No murmur  heard. Pulmonary/Chest: Effort normal and breath sounds normal. No respiratory distress. She has no wheezes. She has no rales.  Abdominal: Soft. Bowel sounds are normal. She exhibits no distension and no mass. There is no tenderness.  Musculoskeletal: Normal range of motion. She exhibits no edema or tenderness.  Upper / Lower Extremities: - Normal muscle tone, strength bilateral upper extremities 5/5, lower extremities 5/5  Lymphadenopathy:    She has no cervical adenopathy.  Neurological: She is alert and oriented to person, place, and time.  Distal sensation intact to light touch all extremities  Skin: Skin is warm and dry. No rash noted. She is not diaphoretic. No erythema.  Psychiatric: She has a normal mood and affect. Her behavior is normal.  Well groomed, good eye contact, normal speech and thoughts  Nursing note and vitals reviewed.  Results for orders placed or performed in visit on 05/25/17  TSH  Result Value Ref Range   TSH 1.40 0.40 - 4.50 mIU/L  VITAMIN D 25 Hydroxy (Vit-D Deficiency, Fractures)  Result Value Ref Range   Vit D, 25-Hydroxy 18 (L) 30 - 100 ng/mL  Lipid panel  Result Value Ref Range   Cholesterol 202 (H) <200 mg/dL   HDL 53 >16>50 mg/dL   Triglycerides 109114 <604<150 mg/dL   LDL Cholesterol (Calc) 127 (H) mg/dL (calc)   Total CHOL/HDL Ratio 3.8 <5.0 (calc)   Non-HDL Cholesterol (Calc) 149 (H) <130 mg/dL (calc)  CBC with Differential/Platelet  Result Value Ref Range   WBC 5.0 3.8 - 10.8 Thousand/uL   RBC 4.48 3.80 - 5.10 Million/uL   Hemoglobin 12.8 11.7 - 15.5 g/dL   HCT 54.038.7 98.135.0 - 19.145.0 %   MCV 86.4 80.0 - 100.0 fL   MCH 28.6 27.0 - 33.0 pg   MCHC 33.1 32.0 - 36.0 g/dL   RDW 47.812.9 29.511.0 - 62.115.0 %   Platelets 286 140 - 400 Thousand/uL   MPV 10.3 7.5 - 12.5 fL   Neutro Abs 2,705 1,500 - 7,800 cells/uL   Lymphs Abs 1,585 850 - 3,900 cells/uL   WBC mixed population 520 200 - 950 cells/uL   Eosinophils Absolute 160 15 - 500 cells/uL   Basophils Absolute 30 0 -  200 cells/uL   Neutrophils Relative % 54.1 %   Total Lymphocyte 31.7 %   Monocytes Relative 10.4 %   Eosinophils Relative 3.2 %   Basophils Relative 0.6 %  Hemoglobin A1c  Result Value Ref Range   Hgb A1c MFr Bld 5.9 (H) <5.7 % of total Hgb   Mean Plasma Glucose 123 (calc)   eAG (mmol/L) 6.8 (calc)  COMPLETE METABOLIC  PANEL WITH GFR  Result Value Ref Range   Glucose, Bld 90 65 - 99 mg/dL   BUN 15 7 - 25 mg/dL   Creat 1.610.98 0.960.50 - 0.451.05 mg/dL   GFR, Est Non African American 64 > OR = 60 mL/min/1.3473m2   GFR, Est African American 74 > OR = 60 mL/min/1.5273m2   BUN/Creatinine Ratio NOT APPLICABLE 6 - 22 (calc)   Sodium 139 135 - 146 mmol/L   Potassium 4.6 3.5 - 5.3 mmol/L   Chloride 104 98 - 110 mmol/L   CO2 28 20 - 32 mmol/L   Calcium 9.1 8.6 - 10.4 mg/dL   Total Protein 6.7 6.1 - 8.1 g/dL   Albumin 3.9 3.6 - 5.1 g/dL   Globulin 2.8 1.9 - 3.7 g/dL (calc)   AG Ratio 1.4 1.0 - 2.5 (calc)   Total Bilirubin 0.4 0.2 - 1.2 mg/dL   Alkaline phosphatase (APISO) 76 33 - 130 U/L   AST 17 10 - 35 U/L   ALT 19 6 - 29 U/L      Assessment & Plan:   Problem List Items Addressed This Visit    Hot flash, menopausal    Improved on black cohosh herbal supplement Still has hot flashes at night periodically Failed Paxil, Gabapentin in past      Hyperlipidemia    Mostlycontrolled cholesterol on lifestyle still poor regular exercise Last lipid panel 05/2017 Calculated ASCVD 10 yr risk score 2.9%  Plan: 1. Discussed ASCVD risk - defer statin for now 2. Future consider ASA if elevated risk 3. Encourage improved lifestyle - low carb/cholesterol, reduce portion size, continue improving regular exercise 4. Follow-up lipids yearly      Morbid obesity (HCC)    Clinically consistent with Morbid Obesity with BMI 36 (< 40) due to comorbid conditions with OSA, HLD, GERD Weight gain still abnormal, limited exercise but improved diet, also concern postmenopausal hormonal factor, likely limited wt loss  with uncontrolled OSA as well - Prior failed Phentermine med - Improved A1c. Normal TSH  Plan: 1. Recommend trial of weight loss medication after discussion to help assist with her lifestyle changes - recommend options Saxenda, Liraglutide, or Contrave, will check cost/coverage then likely trial rx 2. Counseling on encourage lifestyle changes to improve regular exercise and continue diet 3. Follow-up 3 months wt check      OSA (obstructive sleep apnea)    Remains uncontrolled waiting on initiating CPAP, has not received machine yet Severe OSA on last PSG Last PSG 2018, per [facility], [facility], and [facility] Suspect OSA control will likely help with wt loss in future      Prediabetes    Improved control Pre-DM with A1c down to 5.9 from prior 6.4 Concern with obesity, HTN, HLD, OSA - Note prior ins check on GLP1 medications was PA/step therapy  Plan:  1. Continue Metformin XR 1000mg  daily (500mg  tabs) - May consider GLP1 trial for wt loss 2. Encourage improved lifestyle - emphasized limit fast food, low carb, low sugar diet, reduce portion size, limit soda/tea inc water, if needed may add more regular exercise in addition to regular work activity 3. Follow-up 3 months PreDM A1c, wt check      Vitamin D deficiency    Low Vit D 18, had improved before No longer on supplement Start OTC Vitamin D3 5,000 iu daily for 12 weeks then reduce to OTC Vitamin D3 2,000 iu daily maintenance       Other Visit Diagnoses  Annual physical exam    -  Primary      Meds ordered this encounter  Medications  . BLACK COHOSH EXTRACT PO    Sig: Take daily by mouth.    Follow up plan: Return in about 3 months (around 08/27/2017) for PreDM A1c, Weight med adjust, OSA CPAP.  Saralyn Pilar, DO [facility] 05/27/2017, 1:48 PM

## 2017-05-27 NOTE — Assessment & Plan Note (Signed)
Clinically consistent with Morbid Obesity with BMI 36 (< 40) due to comorbid conditions with OSA, HLD, GERD Weight gain still abnormal, limited exercise but improved diet, also concern postmenopausal hormonal factor, likely limited wt loss with uncontrolled OSA as well - Prior failed Phentermine med - Improved A1c. Normal TSH  Plan: 1. Recommend trial of weight loss medication after discussion to help assist with her lifestyle changes - recommend options Saxenda, Liraglutide, or Contrave, will check cost/coverage then likely trial rx 2. Counseling on encourage lifestyle changes to improve regular exercise and continue diet 3. Follow-up 3 months wt check

## 2017-05-27 NOTE — Assessment & Plan Note (Signed)
Improved control Pre-DM with A1c down to 5.9 from prior 6.4 Concern with obesity, HTN, HLD, OSA - Note prior ins check on GLP1 medications was PA/step therapy  Plan:  1. Continue Metformin XR 1000mg  daily (500mg  tabs) - May consider GLP1 trial for wt loss 2. Encourage improved lifestyle - emphasized limit fast food, low carb, low sugar diet, reduce portion size, limit soda/tea inc water, if needed may add more regular exercise in addition to regular work activity 3. Follow-up 3 months PreDM A1c, wt check

## 2017-05-27 NOTE — Patient Instructions (Addendum)
Thank you for coming to the clinic today.  1.  A1c very good 5.9 down from 6.4  Cholesterol is good, no changes  Vitamin D - Low 18. Recommend treatment with OTC Vitamin D3 5,000 iu daily for 12 weeks, then reduce dose to Vitamin D3 2,000 iu daily for maintenance  2. Check with insurance coverage for the following weight loss medications  Saxenda  Liraglutide (Victoza)  Contrave  3. Encouraged about CPAP machine once you start using. - Try to wear the mask occasionally when not sleeping in the evening to get used to it  Please schedule a Follow-up Appointment to: Return in about 3 months (around 08/27/2017) for PreDM A1c, Weight med adjust, OSA CPAP.  If you have any other questions or concerns, please feel free to call the clinic or send a message through MyChart. You may also schedule an earlier appointment if necessary.  Additionally, you may be receiving a survey about your experience at our clinic within a few days to 1 week by e-mail or mail. We value your feedback.  Saralyn PilarAlexander Kortez Murtagh, DO [facility]

## 2017-05-30 ENCOUNTER — Encounter: Payer: BC Managed Care – PPO | Admitting: Family Medicine

## 2017-06-01 ENCOUNTER — Telehealth: Payer: Self-pay | Admitting: Family Medicine

## 2017-06-01 DIAGNOSIS — R7303 Prediabetes: Secondary | ICD-10-CM

## 2017-06-01 MED ORDER — LIRAGLUTIDE 18 MG/3ML ~~LOC~~ SOPN: PEN_INJECTOR | SUBCUTANEOUS | 2 refills | Status: DC

## 2017-06-01 NOTE — Telephone Encounter (Signed)
See last office visit on 05/27/17 for clinical discussion on weight management.  Received notification from patient's daughter that she called patients insurance today and provided the following information:  Bernie CoveySaxenda will need a PA, $47 copay but with copay card it will be $25.   Victoza will be $47 with copay card $25.   Contrave would not come up in the system  ------------------------------------- Recommend to start with lower dose with Victoza 0.6 up to 1.2 and then max dose 1.8 mg DAILY injection, instead of higher dose Saxenda up to 3mg  inj.  Will send new rx Victoza (Liraglutide) start dose 0.6mg  DAILY inj, then increase dose by 0.6mg  every 1-2 weeks as tolerated until max dose 1.8mg  DAILY. Caution nausea, side effect, may reduce dose if symptoms of nausea and continue lower dose for longer time period.  Saralyn PilarAlexander Estus Krakowski, DO [facility] 06/01/2017, 5:11 PM

## 2017-06-13 ENCOUNTER — Telehealth: Payer: Self-pay

## 2017-06-13 DIAGNOSIS — J0110 Acute frontal sinusitis, unspecified: Secondary | ICD-10-CM

## 2017-06-13 DIAGNOSIS — J069 Acute upper respiratory infection, unspecified: Secondary | ICD-10-CM

## 2017-06-13 MED ORDER — IPRATROPIUM BROMIDE 0.06 % NA SOLN: 2.0000 | Freq: Four times a day (QID) | NASAL | 0 refills | Status: DC

## 2017-06-13 NOTE — Telephone Encounter (Signed)
Called patient. Clarified, symptoms onset Saturday x 2 nights with sinus congestion initial dry mouth and sore throat from starting CPAP, now has thicker green mucus productive. Advised her likely viral source, may be dry mouth from CPAP, concern possible viral infection though, start with new rx sent in Atrovent nasal for decongestant short term. May use other OTC meds such as Mucinex and other for now.  Advised her to call back or notify me within 48 hours, by Wednesday if still persistent problem sinus production or other worsening sinus symptoms fever sinus pain then we can try antibiotics before her upcoming travel on Thursday. I would send in Augmentin at that time if not improved. I will be out of office Thursday as well.  Ashley PilarAlexander Naidelyn Parrella, DO [facility] 06/13/2017, 4:50 PM

## 2017-06-13 NOTE — Telephone Encounter (Signed)
Patient has called reporting she is having sinus pressure with green mucus.  She has started her c-pap at night and not sure if this is the source of a sinus infection.  She is requesting an abx sent into [facility].  Please advise

## 2017-06-15 ENCOUNTER — Telehealth: Payer: Self-pay | Admitting: Nurse Practitioner

## 2017-06-15 DIAGNOSIS — J0110 Acute frontal sinusitis, unspecified: Secondary | ICD-10-CM

## 2017-06-15 MED ORDER — AMOXICILLIN 500 MG PO CAPS: 500.0000 mg | ORAL_CAPSULE | Freq: Two times a day (BID) | ORAL | 0 refills | Status: DC

## 2017-06-15 NOTE — Telephone Encounter (Signed)
Spoke with Marylene LandAngela 11/26. Will send in Amoxicillin for sinus.  Saralyn PilarAlexander Karamalegos, DO [facility] 06/15/2017, 12:06 PM

## 2017-06-15 NOTE — Telephone Encounter (Signed)
Pt has sinus pressure and green mucous.  [facility].

## 2017-07-25 ENCOUNTER — Emergency Department
Admission: EM | Admit: 2017-07-25 | Discharge: 2017-07-25 | Disposition: A | Discharge status: Home / Self Care | Payer: BC Managed Care – PPO | Attending: Emergency Medicine | Admitting: Emergency Medicine

## 2017-07-25 ENCOUNTER — Encounter: Payer: Self-pay | Admitting: *Deleted

## 2017-07-25 ENCOUNTER — Emergency Department: Payer: BC Managed Care – PPO

## 2017-07-25 ENCOUNTER — Other Ambulatory Visit: Payer: Self-pay

## 2017-07-25 ENCOUNTER — Encounter: Payer: Self-pay | Admitting: Family Medicine

## 2017-07-25 ENCOUNTER — Ambulatory Visit: Payer: BC Managed Care – PPO | Admitting: Family Medicine

## 2017-07-25 VITALS — BP 120/73 | HR 100 | Temp 100.1°F | Ht 70.0 in

## 2017-07-25 DIAGNOSIS — R6889 Other general symptoms and signs: Secondary | ICD-10-CM | POA: Diagnosis not present

## 2017-07-25 DIAGNOSIS — J181 Lobar pneumonia, unspecified organism: Secondary | ICD-10-CM | POA: Diagnosis not present

## 2017-07-25 DIAGNOSIS — R509 Fever, unspecified: Secondary | ICD-10-CM

## 2017-07-25 DIAGNOSIS — Z7984 Long term (current) use of oral hypoglycemic drugs: Secondary | ICD-10-CM | POA: Insufficient documentation

## 2017-07-25 DIAGNOSIS — Z79899 Other long term (current) drug therapy: Secondary | ICD-10-CM | POA: Insufficient documentation

## 2017-07-25 DIAGNOSIS — R531 Weakness: Secondary | ICD-10-CM | POA: Diagnosis not present

## 2017-07-25 DIAGNOSIS — J189 Pneumonia, unspecified organism: Secondary | ICD-10-CM

## 2017-07-25 DIAGNOSIS — Z886 Allergy status to analgesic agent status: Secondary | ICD-10-CM | POA: Diagnosis not present

## 2017-07-25 LAB — URINALYSIS, COMPLETE (UACMP) WITH MICROSCOPIC
Bacteria, UA: NONE SEEN
Bilirubin Urine: NEGATIVE
GLUCOSE, UA: NEGATIVE mg/dL
HGB URINE DIPSTICK: NEGATIVE
KETONES UR: NEGATIVE mg/dL
LEUKOCYTES UA: NEGATIVE
NITRITE: NEGATIVE
PROTEIN: 100 mg/dL — AB
Specific Gravity, Urine: 1.028 (ref 1.005–1.030)
pH: 6 (ref 5.0–8.0)

## 2017-07-25 LAB — COMPREHENSIVE METABOLIC PANEL
ALK PHOS: 81 U/L (ref 38–126)
ALT: 25 U/L (ref 14–54)
ANION GAP: 11 (ref 5–15)
AST: 26 U/L (ref 15–41)
Albumin: 3.9 g/dL (ref 3.5–5.0)
BUN: 10 mg/dL (ref 6–20)
CALCIUM: 8.7 mg/dL — AB (ref 8.9–10.3)
CO2: 21 mmol/L — ABNORMAL LOW (ref 22–32)
CREATININE: 0.88 mg/dL (ref 0.44–1.00)
Chloride: 104 mmol/L (ref 101–111)
Glucose, Bld: 155 mg/dL — ABNORMAL HIGH (ref 65–99)
Potassium: 3.7 mmol/L (ref 3.5–5.1)
Sodium: 136 mmol/L (ref 135–145)
Total Bilirubin: 0.7 mg/dL (ref 0.3–1.2)
Total Protein: 8.2 g/dL — ABNORMAL HIGH (ref 6.5–8.1)

## 2017-07-25 LAB — CBC WITH DIFFERENTIAL/PLATELET
BASOS ABS: 45 {cells}/uL (ref 0–200)
BASOS ABS: 0 10*3/uL (ref 0–0.1)
BASOS PCT: 0 %
Basophils Relative: 0.4 %
EOS ABS: 45 {cells}/uL (ref 15–500)
EOS ABS: 0 10*3/uL (ref 0–0.7)
Eosinophils Relative: 0.4 %
Eosinophils Relative: 0 %
HCT: 38 % (ref 35.0–47.0)
HEMATOCRIT: 36.4 % (ref 35.0–45.0)
HEMOGLOBIN: 12.6 g/dL (ref 11.7–15.5)
HEMOGLOBIN: 12.7 g/dL (ref 12.0–16.0)
Lymphocytes Relative: 6 %
Lymphs Abs: 885 cells/uL (ref 850–3900)
Lymphs Abs: 0.6 10*3/uL — ABNORMAL LOW (ref 1.0–3.6)
MCH: 29.2 pg (ref 27.0–33.0)
MCH: 29.4 pg (ref 26.0–34.0)
MCHC: 34.6 g/dL (ref 32.0–36.0)
MCHC: 33.4 g/dL (ref 32.0–36.0)
MCV: 84.3 fL (ref 80.0–100.0)
MCV: 88.2 fL (ref 80.0–100.0)
MONOS PCT: 7.9 %
MPV: 10.5 fL (ref 7.5–12.5)
Monocytes Absolute: 1 10*3/uL — ABNORMAL HIGH (ref 0.2–0.9)
Monocytes Relative: 9 %
NEUTROS ABS: 9341 {cells}/uL — AB (ref 1500–7800)
NEUTROS PCT: 83.4 %
Neutro Abs: 9.9 10*3/uL — ABNORMAL HIGH (ref 1.4–6.5)
Neutrophils Relative %: 85 %
Platelets: 285 10*3/uL (ref 140–400)
Platelets: 267 10*3/uL (ref 150–440)
RBC: 4.32 10*6/uL (ref 3.80–5.10)
RBC: 4.31 MIL/uL (ref 3.80–5.20)
RDW: 12.6 % (ref 11.0–15.0)
RDW: 13.6 % (ref 11.5–14.5)
Total Lymphocyte: 7.9 %
WBC mixed population: 885 cells/uL (ref 200–950)
WBC: 11.2 10*3/uL — ABNORMAL HIGH (ref 3.8–10.8)
WBC: 11.7 10*3/uL — AB (ref 3.6–11.0)

## 2017-07-25 LAB — BASIC METABOLIC PANEL WITH GFR
BUN: 10 mg/dL (ref 7–25)
CALCIUM: 9 mg/dL (ref 8.6–10.4)
CHLORIDE: 103 mmol/L (ref 98–110)
CO2: 26 mmol/L (ref 20–32)
Creat: 0.93 mg/dL (ref 0.50–1.05)
GFR, EST AFRICAN AMERICAN: 79 mL/min/{1.73_m2} (ref >=60)
GFR, EST NON AFRICAN AMERICAN: 68 mL/min/{1.73_m2} (ref >=60)
Glucose, Bld: 128 mg/dL (ref 65–139)
POTASSIUM: 3.9 mmol/L (ref 3.5–5.3)
Sodium: 136 mmol/L (ref 135–146)

## 2017-07-25 LAB — LACTIC ACID, PLASMA: LACTIC ACID, VENOUS: 1.6 mmol/L (ref 0.5–1.9)

## 2017-07-25 LAB — POCT INFLUENZA A/B
INFLUENZA A, POC: NEGATIVE
INFLUENZA B, POC: NEGATIVE

## 2017-07-25 MED ORDER — AZITHROMYCIN 250 MG PO TABS: ORAL_TABLET | ORAL | 0 refills | Status: AC

## 2017-07-25 MED ORDER — METOCLOPRAMIDE HCL 5 MG/ML IJ SOLN
10.0000 mg | Freq: Once | INTRAMUSCULAR | Status: AC
  Administered 2017-07-25: 10 mg via INTRAVENOUS
  Filled 2017-07-25: qty 2

## 2017-07-25 MED ORDER — NAPROXEN 500 MG PO TABS: 500.0000 mg | ORAL_TABLET | Freq: Two times a day (BID) | ORAL | 0 refills | Status: DC

## 2017-07-25 MED ORDER — IOPAMIDOL (ISOVUE-300) INJECTION 61%
75.0000 mL | Freq: Once | INTRAVENOUS | Status: AC | PRN
  Administered 2017-07-25: 75 mL via INTRAVENOUS

## 2017-07-25 MED ORDER — DEXTROSE 5 % IV SOLN
500.0000 mg | Freq: Once | INTRAVENOUS | Status: AC
  Administered 2017-07-25: 500 mg via INTRAVENOUS
  Filled 2017-07-25: qty 500

## 2017-07-25 MED ORDER — ACETAMINOPHEN 325 MG PO TABS
650.0000 mg | ORAL_TABLET | Freq: Once | ORAL | Status: DC | PRN
  Filled 2017-07-25: qty 2

## 2017-07-25 MED ORDER — KETOROLAC TROMETHAMINE 30 MG/ML IJ SOLN
30.0000 mg | Freq: Once | INTRAMUSCULAR | Status: AC
  Administered 2017-07-25: 30 mg via INTRAVENOUS
  Filled 2017-07-25: qty 1

## 2017-07-25 MED ORDER — CEFTRIAXONE SODIUM IN DEXTROSE 20 MG/ML IV SOLN
1.0000 g | Freq: Once | INTRAVENOUS | Status: AC
Start: 2017-07-25 — End: 2017-07-25
  Administered 2017-07-25: 1 g via INTRAVENOUS
  Filled 2017-07-25: qty 50

## 2017-07-25 MED ORDER — ONDANSETRON HCL 4 MG/2ML IJ SOLN
4.0000 mg | Freq: Once | INTRAMUSCULAR | Status: AC
  Administered 2017-07-25: 4 mg via INTRAVENOUS
  Filled 2017-07-25: qty 2

## 2017-07-25 MED ORDER — SODIUM CHLORIDE 0.9 % IV BOLUS (SEPSIS)
1000.0000 mL | Freq: Once | INTRAVENOUS | Status: AC
  Administered 2017-07-25: 1000 mL via INTRAVENOUS

## 2017-07-25 MED ORDER — OSELTAMIVIR PHOSPHATE 75 MG PO CAPS: 75.0000 mg | ORAL_CAPSULE | Freq: Two times a day (BID) | ORAL | 0 refills | Status: DC

## 2017-07-25 MED ORDER — SUCRALFATE 1 G PO TABS: 1.0000 g | ORAL_TABLET | Freq: Three times a day (TID) | ORAL | 0 refills | Status: DC

## 2017-07-25 NOTE — Discharge Instructions (Signed)
Take the antibiotics prescribed starting tomorrow and finish the full course.  You should return to taking 600 mg of ibuprofen every 6 hours for fever and body aches, and you can split the 6 hour intervals with Tylenol as discussed, so therefore you would take ibuprofen at noon, Tylenol at 3 PM, ibuprofen at 6 PM, etc.  You may discontinue the Tamiflu.  Follow-up with your primary care doctor within the next week.  Return to the emergency department immediately for new or worsening shortness of breath, chest pain, weakness, vomiting or inability to tolerate the antibiotic, or any other new or worsening symptoms that concern you.

## 2017-07-25 NOTE — Patient Instructions (Addendum)
Thank you for coming to the office today.  1.  Start Tamiflu 75mg  TWICE daily - can take with food for 5 days  If other family member start similar symptoms call me within 24 hours  2.  STOP Ibuprofen Start Naproxen  Recommend trial of Anti-inflammatory with Naproxen (Naprosyn) 500mg  tabs - take one with food and plenty of water TWICE daily every day (breakfast and dinner), for next 1-2 weeks - DO NOT TAKE any ibuprofen, aleve, motrin while you are taking this medicine - It is safe to take Tylenol Ext Str 500mg  tabs - take 1 to 2 (max dose 1000mg ) every 6 hours as needed for breakthrough pain, max 24 hour daily dose is 6 to 8 tablets or 4000mg   Take Sucralfate (Catafate) as needed for coating stomach when taking Naproxen and Tylenol  We may consider burst of prednisone as discussed or antibiotics if develop more productive cough or pneumonia possible  Will check blood work as well  Should improve by 48-72 hours otherwise notify office  Please schedule a Follow-up Appointment to: Return in about 1 week (around 08/01/2017), or if symptoms worsen or fail to improve, for flu-like illness.    If you have any other questions or concerns, please feel free to call the office or send a message through MyChart. You may also schedule an earlier appointment if necessary.  Additionally, you may be receiving a survey about your experience at our office within a few days to 1 week by e-mail or mail. We value your feedback.  Saralyn PilarAlexander Yonatan Guitron, DO [facility]

## 2017-07-25 NOTE — ED Triage Notes (Signed)
Pt to ED reporting generalized body aches, SOB, fever, and vomiting since Thursday. PT reports she was been seen by two different doctors and has had 2 negative flu tests. PT was given tamiflu by her PCP anyway's but reports she continues to feel worse. Pt reports fevers of 104 at home today. PT took naproxen at 1400 and currently has a temp of 102.6. Pt reports decreased PO intake.

## 2017-07-25 NOTE — ED Notes (Signed)
Pt waiting to see if zofran helps before taking tylenol.

## 2017-07-25 NOTE — Progress Notes (Addendum)
Subjective:    Patient ID: Ashley Hale, female    DOB: Mar 10, 1961, 57 y.o.   MRN: [id number]  Ashley Hale is a 57 y.o. female presenting on 07/25/2017 for Fever (fever, bodyaches, chills and coughing x 4 days )  Patient presents for a same day appointment. Accompanied by husband, Ashley Hale who provides additional history.  HPI   VIRAL SYNDROME / Fevers / Coughing Reports symptoms started gradually on Thursday 07/21/17 she worked a lot that day but then started to feel tired and chills, then felt dramatically worse over next 48 hours, worst part with body aches and chills, now feels more drained without energy as primary concern. 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 Multiple family contact but no one sick. - Additional update she went to [facility] on Saturday, stated she had CXR and Flu Test both were negative, and given supportive care only, do not have outside records - Taking Ibuprofen 200mg  x 4 pills every 4-6 hours and Tylenol 500mg  x 2 every 4-6 hours alternating - Admits persistent not improved body aches, - Admits nausea at times, reduced PO intake - Admits occasional cough with limited productive, had some episodes of worsening shortness of breath over weekend when more active - Admits abdominal pain due to ibuprofen - Denies chest pain or pressure, rash, headache, vomiting, diarrhea  Health Maintenance: UTD Flu Vaccine 04/2017  Depression screen [facility] 2/9 05/27/2017 09/15/2015 05/05/2015  Decreased Interest 0 0 0  Down, Depressed, Hopeless 0 0 0  PHQ - 2 Score 0 0 0    Social History   Tobacco Use  . Smoking status: Never Smoker  . Smokeless tobacco: Never Used  Substance Use Topics  . Alcohol use: No    Alcohol/week: 0.0 oz  . Drug use: No    Review of Systems Per HPI unless specifically indicated above     Objective:      BP 120/73 (BP Location: Right Arm, Patient Position: Sitting, Cuff Size: Large)   Pulse 100   Temp 100.1 F (37.8 C) (Oral)   Ht 5\' 10"  (1.778 m)   BMI 36.30 kg/m   Wt Readings from Last 3 Encounters:  05/27/17 253 lb (114.8 kg)  02/21/17 250 lb (113.4 kg)  06/23/16 244 lb 8 oz (110.9 kg)    Physical Exam  Constitutional: She is oriented to person, place, and time. She appears well-developed and well-nourished. No distress.  Moderately ill and tired appearing, slightly uncomfortable with low grade fever / body aches, cooperative  HENT:  Head: Normocephalic and atraumatic.  Mouth/Throat: Oropharynx is clear and moist.  Frontal / maxillary sinuses non-tender. Nares patent without purulence or edema. Bilateral TMs clear without erythema, effusion or bulging. Oropharynx clear without erythema, exudates, edema or asymmetry.  Mild dry mucus mem  Eyes: Conjunctivae are normal. Right eye exhibits no discharge. Left eye exhibits no discharge.  Neck: Normal range of motion. Neck supple. No thyromegaly present.  Cardiovascular: Normal rate, regular rhythm, normal heart sounds and intact distal pulses.  No murmur heard. Pulmonary/Chest: Effort normal and breath sounds normal. No respiratory distress. She has no wheezes. She has no rales.  Good air movement. No coughing. Speaks full sentences.  Musculoskeletal: Normal range of motion. She exhibits no edema.  Lymphadenopathy:    She has no cervical adenopathy.  Neurological: She is alert and oriented  to person, place, and time.  Skin: Skin is warm and dry. No rash noted. She is not diaphoretic. No erythema.  Psychiatric: Her behavior is normal.  Nursing note and vitals reviewed.      Results for orders placed or performed in visit on 07/25/17  POCT Influenza A/B  Result Value Ref Range   Influenza A, POC Negative Negative   Influenza B, POC Negative Negative      Assessment & Plan:   Problem List Items Addressed This Visit     None    Visit Diagnoses    Flu-like symptoms    -  Primary   Relevant Medications   naproxen (NAPROSYN) 500 MG tablet   oseltamivir (TAMIFLU) 75 MG capsule   Other Relevant Orders   POCT Influenza A/B (Completed)   BASIC METABOLIC PANEL WITH GFR   CBC with Differential/Platelet   Fever, unspecified fever cause       Relevant Medications   naproxen (NAPROSYN) 500 MG tablet   Other Relevant Orders   POCT Influenza A/B (Completed)   NSAID sensitivity       Relevant Medications   sucralfate (CARAFATE) 1 g tablet   Generalized weakness       Relevant Orders   BASIC METABOLIC PANEL WITH GFR   CBC with Differential/Platelet      Clinically diagnosed influenza despite negative rapid flu test today. Possible other viral syndrome. - Duration x 5 days, without complication. Reduced tolerating PO intake, still fairly well hydrated - No other focal findings of infection today - POx 97% on RA, no wheezing or bronchospasm - S/p influenza vaccine this season  Plan: 1. Check Rapid Flu A/B today - NEGATIVE again - discussion on treatment options, agree Start empiric rx Tamiflu 75mg  capsules BID x 5 days - No prophylaxis tamiflu rx for family at this time since asymptomatic, if develop symptoms call within 24 hours 2. Supportive care as advised with NSAID / Tylenol PRN fever/myalgias, improve hydration, may take OTC Cold/Flu meds - STOP Ibuprofen switch to Naproxen rx 500 BID and Carafate PRN with NSAID, continue Tylenol 1g TID PRN - Additionally check BMET CBC for other etiology of generalized weakness malaise with viral syndrome, may be mild dehydrated - Return criteria given if significant worsening, consider post-influenza complications, otherwise follow-up if needed - deferred CXR today, may consider antibiotics vs prednisone within 48 hours if needed   Meds ordered this encounter  Medications  . sucralfate (CARAFATE) 1 g tablet    Sig: Take 1 tablet (1 g total) by mouth 4 (four) times  daily -  with meals and at bedtime. For taking with anti-inflammatory    Dispense:  40 tablet    Refill:  0  . naproxen (NAPROSYN) 500 MG tablet    Sig: Take 1 tablet (500 mg total) by mouth 2 (two) times daily with a meal.    Dispense:  30 tablet    Refill:  0  . oseltamivir (TAMIFLU) 75 MG capsule    Sig: Take 1 capsule (75 mg total) by mouth 2 (two) times daily. For 5 days    Dispense:  10 capsule    Refill:  0    Follow up plan: Return in about 1 week (around 08/01/2017), or if symptoms worsen or fail to improve, for flu-like illness.   Saralyn PilarAlexander Kebrina Friend, DO [facility] 07/25/2017, 12:18 PM

## 2017-07-25 NOTE — ED Provider Notes (Signed)
[facility] Emergency Department Provider Note ____________________________________________   First MD Initiated Contact with Patient 07/25/17 1854     (approximate)  I have reviewed the triage vital signs and the nursing notes.   HISTORY  Chief Complaint Fever and Emesis    HPI Ashley Hale is a 57 y.o. female with past medical history as noted below who presents with fever for the last 4 days, persistent course, minimally relieved by Tylenol and Naprosyn, and associated with nonproductive cough, nausea, generalized body aches, and malaise, and headache.  The patient states that she was seen in urgent care 3 days ago and had a negative flu test.  She saw her primary care doctor today and had another flu test which was negative but was still started on Tamiflu.  She states that she was prescribed Naprosyn for the fever, but continued to feel worse throughout the course of the day and decided to come to the emergency department.  She states that she has had a few episodes of posttussive emesis, as well as nausea, decreased appetite, and abdominal discomfort but no acute pain.   Past Medical History:  Diagnosis Date  . GERD (gastroesophageal reflux disease)   . IBS (irritable bowel syndrome)   . Obesity     Patient Active Problem List   Diagnosis Date Noted  . OSA (obstructive sleep apnea) 02/21/2017  . Hyperlipidemia 02/21/2017  . Vitamin D deficiency 12/10/2015  . Prediabetes 09/15/2015  . GERD (gastroesophageal reflux disease) 12/20/2014  . Hot flash, menopausal 12/20/2014  . Morbid obesity (HCC) 12/20/2014  . H/O renal calculi 05/08/2011    Past Surgical History:  Procedure Laterality Date  . ABDOMINAL HYSTERECTOMY    . BACK SURGERY  1992    Prior to Admission medications   Medication Sig Start Date End Date Taking? Authorizing Provider  albuterol (PROAIR HFA) 108 (90 Base) MCG/ACT inhaler Inhale 2 puffs into the lungs every 6 (six) hours as  needed for wheezing or shortness of breath. Patient not taking: Reported on 07/25/2017 09/02/15   Loura PardonKrebs, Amy Lauren, NP  BLACK COHOSH EXTRACT PO Take daily by mouth.    [provider]  famotidine (PEPCID) 40 MG tablet Take 1 tablet (40 mg total) by mouth 2 (two) times daily. 08/23/16   Karamalegos, Netta NeatAlexander J, DO  fluticasone (FLONASE) 50 MCG/ACT nasal spray Place 2 sprays into both nostrils daily. Use for 4-6 weeks then stop and use seasonally or as needed. 06/23/16   Karamalegos, Netta NeatAlexander J, DO  furosemide (LASIX) 40 MG tablet Take 1 tablet by mouth daily. 06/29/13   Velta AddisonShah, Syed Asad A, MD  ipratropium (ATROVENT) 0.06 % nasal spray Place 2 sprays into both nostrils 4 (four) times daily. For up to 5-7 days then stop. 06/13/17   Karamalegos, Netta NeatAlexander J, DO  liraglutide (VICTOZA) 18 MG/3ML SOPN Inject 0.6mg  into skin daily for 1-2 weeks, increase by 0.6mg  every 1-2 weeks as tolerated, until reach max dose 1.8mg  daily 06/01/17   Karamalegos, Netta NeatAlexander J, DO  loratadine (CLARITIN) 10 MG tablet Take 1 tablet (10 mg total) by mouth daily. Use for 4-6 weeks then stop, and use as needed or seasonally 06/23/16   Althea CharonKaramalegos, Netta NeatAlexander J, DO  metFORMIN (GLUCOPHAGE-XR) 500 MG 24 hr tablet Take 2 tablets (1,000 mg total) by mouth daily with breakfast. 02/21/17   Althea CharonKaramalegos, Netta NeatAlexander J, DO  naproxen (NAPROSYN) 500 MG tablet Take 1 tablet (500 mg total) by mouth 2 (two) times daily with a meal. 07/25/17  Karamalegos, Alexander J, DO  Omeprazole 20 MG TBEC Take 1 capsule by mouth daily. 06/12/13   Ellyn Hack, MD  oseltamivir (TAMIFLU) 75 MG capsule Take 1 capsule (75 mg total) by mouth 2 (two) times daily. For 5 days 07/25/17   Smitty Cords, DO  PARoxetine (PAXIL) 20 MG tablet TAKE 1 TABLET BY MOUTH ONCE DAILY 04/11/17   Althea Charon, Netta Neat, DO  rOPINIRole (REQUIP) 0.25 MG tablet Take 1-2 tablets (0.25-0.5 mg total) by mouth at bedtime as needed (Restless legs). 04/22/17   Karamalegos,  Netta Neat, DO  sucralfate (CARAFATE) 1 g tablet Take 1 tablet (1 g total) by mouth 4 (four) times daily -  with meals and at bedtime. For taking with anti-inflammatory 07/25/17   Smitty Cords, DO    Allergies Shellfish allergy and Sulfa antibiotics  Family History  Problem Relation Age of Onset  . Heart disease Mother   . Hypertension Mother   . Hyperlipidemia Mother   . Heart disease Father   . Glaucoma Father   . Breast cancer Neg Hx     Social History Social History   Tobacco Use  . Smoking status: Never Smoker  . Smokeless tobacco: Never Used  Substance Use Topics  . Alcohol use: No    Alcohol/week: 0.0 oz  . Drug use: No    Review of Systems  Constitutional: Positive for fever and chills. Eyes: No redness. ENT: No sore throat. Cardiovascular: Denies chest pain. Respiratory: Positive for cough and shortness of breath.. Gastrointestinal: Positive for nausea nausea and posttussive emesis.  No diarrhea.  Genitourinary: Negative for dysuria.  Musculoskeletal: Positive for generalized body aches. Skin: Negative for rash. Neurological: Positive for headaches.    ____________________________________________   PHYSICAL EXAM:  VITAL SIGNS: ED Triage Vitals  Enc Vitals Group     BP 07/25/17 1708 120/77     Pulse Rate 07/25/17 1708 (!) 128     Resp 07/25/17 1708 (!) 22     Temp 07/25/17 1708 (!) 102.6 F (39.2 C)     Temp Source 07/25/17 1708 Oral     SpO2 07/25/17 1708 98 %     Weight 07/25/17 1708 240 lb (108.9 kg)     Height 07/25/17 1708 5\' 10"  (1.778 m)     Head Circumference --      Peak Flow --      Pain Score 07/25/17 1707 9     Pain Loc --      Pain Edu? --      Excl. in GC? --     Constitutional: Alert and oriented. Well appearing and in no acute distress. Eyes: Conjunctivae are normal.  EOMI.  PERRLA.  No photophobia. Head: Atraumatic. Nose: No congestion/rhinnorhea. Mouth/Throat: Mucous membranes are slightly dry.   Neck:  Normal range of motion.  No meningeal signs.  No lymphadenopathy. Cardiovascular: Tachycardic, regular rhythm. Grossly normal heart sounds.  Good peripheral circulation. Respiratory: Normal respiratory effort.  No retractions. Lungs CTAB. Gastrointestinal: Soft and nontender. No distention.  Genitourinary: No flank tenderness. Musculoskeletal: No lower extremity edema.  Extremities warm and well perfused.  Neurologic:  Normal speech and language. No gross focal neurologic deficits are appreciated.  Skin:  Skin is warm and dry. No rash noted. Psychiatric: Mood and affect are normal. Speech and behavior are normal.  ____________________________________________   LABS (all labs ordered are listed, but only abnormal results are displayed)  Labs Reviewed  COMPREHENSIVE METABOLIC PANEL - Abnormal; Notable for the following components:  Result Value   CO2 21 (*)    Glucose, Bld 155 (*)    Calcium 8.7 (*)    Total Protein 8.2 (*)    All other components within normal limits  CBC WITH DIFFERENTIAL/PLATELET - Abnormal; Notable for the following components:   WBC 11.7 (*)    Neutro Abs 9.9 (*)    Lymphs Abs 0.6 (*)    Monocytes Absolute 1.0 (*)    All other components within normal limits  URINALYSIS, COMPLETE (UACMP) WITH MICROSCOPIC - Abnormal; Notable for the following components:   Color, Urine AMBER (*)    APPearance CLEAR (*)    Protein, ur 100 (*)    Squamous Epithelial / LPF 0-5 (*)    All other components within normal limits  LACTIC ACID, PLASMA  POC URINE PREG, ED   ____________________________________________  EKG   ____________________________________________  RADIOLOGY  CXR: Possible right apical infiltrate versus lesion CT chest: Right upper lobe infiltrate consistent with pneumonia  ____________________________________________   PROCEDURES  Procedure(s) performed: No    Critical Care performed:  No ____________________________________________   INITIAL IMPRESSION / ASSESSMENT AND PLAN / ED COURSE  Pertinent labs & imaging results that were available during my care of the patient were reviewed by me and considered in my medical decision making (see chart for details).  57 year old female with past medical history as noted above presents with several days of persistent fever, nonproductive cough, shortness of breath, headache, body aches, and malaise.  She has had a few episodes of vomiting as well.  She was seen in urgent care 3 days ago and by her PMD today and diagnosed with likely viral infection, and started on Tamiflu today despite negative flu test x2.  Past medical records reviewed in epic and are noncontributory.  On exam, the patient is relatively well-appearing compared to her vital signs, and uncomfortable but in no acute distress.  She is tachycardic and febrile, but the other vital signs are normal.  Lungs are clear, she has no meningeal signs, and abdomen is soft and nontender.  Presentation is overall most consistent with a viral syndrome given the symptoms across multiple systems.  Chest x-ray shows possible right apical infiltrate and pneumonia is also on the differential.  Radiology recommends CT chest to further evaluate so I will obtain this as well as basic labs, lactic acid, and supportive care with Toradol, Reglan, and IV fluids.  Disposition based on response to treatment and results of workup.  ----------------------------------------- 8:56 PM on 07/25/2017 -----------------------------------------  Patient reports feeling significantly better and appears much more comfortable.  Her vital signs have normalized.  CT chest confirms findings consistent with pneumonia.  Lab workup does not reveal evidence of sepsis, and the patient has no hypoxia.  Based on CURB 65 score as well as my clinical assessment, there is no indication for admission at this time.  Patient  agrees and states that she feels well to go home.  The patient has not been hospitalized within the last 3 months so we will give ceftriaxone and azithromycin here for community acquired pneumonia and discharged with a prescription for Z-Pak.  I counseled the patient and her husband on the results of the workup and the discharge plan, as well as extensive return precautions and they expressed understanding.  ____________________________________________   FINAL CLINICAL IMPRESSION(S) / ED DIAGNOSES  Final diagnoses:  Community acquired pneumonia of right upper lobe of lung (HCC)      NEW MEDICATIONS STARTED DURING THIS VISIT:  This SmartLink is deprecated. Use AVSMEDLIST instead to display the medication list for a patient.   Note:  This document was prepared using Dragon voice recognition software and may include unintentional dictation errors.    Dionne Bucy, MD 07/25/17 2057

## 2017-07-27 ENCOUNTER — Telehealth: Payer: Self-pay | Admitting: Family Medicine

## 2017-07-27 DIAGNOSIS — F19982 Other psychoactive substance use, unspecified with psychoactive substance-induced sleep disorder: Secondary | ICD-10-CM

## 2017-07-27 DIAGNOSIS — J9801 Acute bronchospasm: Secondary | ICD-10-CM

## 2017-07-27 DIAGNOSIS — J181 Lobar pneumonia, unspecified organism: Secondary | ICD-10-CM

## 2017-07-27 DIAGNOSIS — J189 Pneumonia, unspecified organism: Secondary | ICD-10-CM

## 2017-07-27 DIAGNOSIS — R0602 Shortness of breath: Secondary | ICD-10-CM

## 2017-07-27 MED ORDER — PREDNISONE 50 MG PO TABS: 50.0000 mg | ORAL_TABLET | Freq: Every day | ORAL | 0 refills | Status: DC

## 2017-07-27 NOTE — Telephone Encounter (Addendum)
Heard update patient was not significantly improved today, I called patient to check on her for updates approx 9am on 07/27/17, spoke with her and her daughter, and that she seemed somewhat improve initially but then had fever again last night still feeling feverish, chills, bodyaches, limited activity mostly in bed resting, difficulty with breathing still has some "gurgling" or "rattling" with noisy breathing at times, short of breath difficulty taking deeper breath. We had discussed prednisone trial previously, she has been on this before with improvement. Agreed to do burst 50mg  daily x 5 days, sent to pharmacy now, and advised that my concern would be still fevers and dyspnea after 48 hours on oral antibiotics (received Ceftriaxone x 1 in hospital and the Azithromycin z-pak oral) may represent treatment failure or may require IV antibiotics and possibly more IVF rehydration. Still cannot rule out viral syndrome as contributing.  Strict return criteria reviewed if not better may need to go to hospital ED tomorrow, she may contact us and we can notify them in advance.  Saralyn PilarAlexander Ambree Frances, DO [facility] 07/27/2017, 10:56 AM

## 2017-07-28 MED ORDER — HYDROXYZINE HCL 25 MG PO TABS: 25.0000 mg | ORAL_TABLET | Freq: Every evening | ORAL | 0 refills | Status: DC | PRN

## 2017-07-28 NOTE — Addendum Note (Signed)
Addended by: Smitty CordsKARAMALEGOS, Regina Ganci J on: 07/28/2017 08:54 AM   Modules accepted: Orders

## 2017-07-28 NOTE — Telephone Encounter (Signed)
UPDATE - 07/28/17  Patient called this AM to report that she is feeling significantly better. She took 1st dose Prednisone 50mg  yesterday 11am, felt more jittery and at night had insomnia mind racing difficulty sleeping, has had similar reaction to prednisone before, and she stated felt like she kept focusing on all events from Saturday until now, was "not always present" but now seems to be much more mentally clear and feels better, breathing improved, fever seems resolved, still generalized weak but dramatic difference.  Continuing prednisone 50mg  daily x 4 more days, now dosing in earlier AM, will help her rest at night. Continues antibiotic Azithromycin as prescribed.  Given this update do not think she needs any other change of antibiotics or hospital care at this time, if symptoms were to change or worsen again or fevers return then may return to hospital for IV antibiotics or other plan.  Regarding the prednisone induced symptoms - she is requesting something to help her sleep, sent rx Hydroxyzine 25mg  tabs take 1-2 at bed time PRN #[id number] rx, she will only take if needed  Saralyn PilarAlexander Karamalegos, DO [facility] 07/28/2017, 8:54 AM

## 2017-08-08 ENCOUNTER — Other Ambulatory Visit: Payer: Self-pay | Admitting: Family Medicine

## 2017-08-08 DIAGNOSIS — G2581 Restless legs syndrome: Secondary | ICD-10-CM

## 2017-08-08 MED ORDER — ROPINIROLE HCL 0.25 MG PO TABS: 0.2500 mg | ORAL_TABLET | Freq: Two times a day (BID) | ORAL | 0 refills | Status: DC

## 2017-08-14 ENCOUNTER — Other Ambulatory Visit: Payer: Self-pay | Admitting: Family Medicine

## 2017-08-14 DIAGNOSIS — K219 Gastro-esophageal reflux disease without esophagitis: Secondary | ICD-10-CM

## 2017-08-26 ENCOUNTER — Ambulatory Visit: Payer: BC Managed Care – PPO | Admitting: Family Medicine

## 2017-08-26 ENCOUNTER — Encounter: Payer: Self-pay | Admitting: Family Medicine

## 2017-08-26 VITALS — BP 127/67 | HR 82 | Temp 98.7°F | Resp 16 | Ht 70.0 in | Wt 249.8 lb

## 2017-08-26 DIAGNOSIS — R7303 Prediabetes: Secondary | ICD-10-CM | POA: Diagnosis not present

## 2017-08-26 DIAGNOSIS — R918 Other nonspecific abnormal finding of lung field: Secondary | ICD-10-CM | POA: Diagnosis not present

## 2017-08-26 DIAGNOSIS — G4733 Obstructive sleep apnea (adult) (pediatric): Secondary | ICD-10-CM | POA: Diagnosis not present

## 2017-08-26 DIAGNOSIS — K219 Gastro-esophageal reflux disease without esophagitis: Secondary | ICD-10-CM

## 2017-08-26 LAB — POCT GLYCOSYLATED HEMOGLOBIN (HGB A1C): Hemoglobin A1C: 6.1 — AB (ref ?–5.7)

## 2017-08-26 NOTE — Patient Instructions (Addendum)
Thank you for coming to the office today.   1.  A1c 6.1 - slightly elevated compared to 5.8, but still similar range - Resume Victoza injections - start back at low dose 0.6 - once daily - every 1-2 weeks as tolerated increase by 0.6 up to max of 1.8 is max  Caution if stomach upset when starting med  Continue Famotidine 40mg  twice daily for now - if after 2 more weeks still problem - let me know and we can switch back to Omeprazole will send rx  2. Keep trying with CPAP  3. We will check on status of Pneumonia vaccine - Pneumovax-23 - and let you know  4. Will order CT Chest non contrast - in 2 months - (3 months after last image 07/25/17) - stay tuned for apt   Please schedule a Follow-up Appointment to: Return in about 3 months (around 11/23/2017) for Pre-DM A1c, Wt check, f/u CT results.    If you have any other questions or concerns, please feel free to call the office or send a message through MyChart. You may also schedule an earlier appointment if necessary.  Additionally, you may be receiving a survey about your experience at our office within a few days to 1 week by e-mail or mail. We value your feedback.  Saralyn PilarAlexander Zelia Yzaguirre, DO [facility]

## 2017-08-26 NOTE — Progress Notes (Signed)
Subjective:    Patient ID: Ashley Hale, female    DOB: Dec 09, 1960, 57 y.o.   MRN: [id number]  Ashley Hale is a 57 y.o. female presenting on 08/26/2017 for prediabetes   HPI   Pre-Diabetes /Obesity BMI >35 Recent history has been sick with influenza then post flu pneumonia, had some wt loss related to illness, then recent wt gain, from 253 lb down to 240, now back to 249 lb. Initially was using victoza tolerating well but then has not used in past 1.5 months due to illness, plans to restart CBGs:Not checking CBG Meds:Metformin XR 1000mg  daily (x 2 of 500mg  tabs) - better adherence now Reports good compliance. Tolerating well w/o side-effects Currentlynot onACEi / ARB Lifestyle: - Diet (still improving diet, goal to resume healthier diet next week, had disruption with illness and then [place]) - Exercise (had improved exercise but then setback with illness, plans to resume) Denies hypoglycemia, polyuria, visual changes, numbness or tingling.  Pulmonary Nodules / Follow-up Flu / Pneumonia - resolved Today reports feeling much better, seems she really felt much better approx 08/04/17 after treatment and discharge from hospital for CAP following possible flu. - She is asking about cost / coverage of Pneumovax-23 vaccine under age 68 as a PreDiabetic and with history of pneumonia x 2 in past 3 years - Also incidental finding on recent Chest CT in hospital 07/25/17 with scattered pulmonary nodules seemed like opacity related to infectious illness with CAP. However recommended CT image follow-up in 3-6 months, she wants to check soonest at 3 months Denies dyspnea, cough, wheezing, hemoptysis, fever chills sweats  OSA on CPAP Still trying to use it more, had problem with pneumonia and illness. Trying to get adjusted to wearing mask.  GERD Reports chronic GERD with some recent flare ups, has been using rx Famotidine 40mg  BID for now, with good results, recently though some refractory  symptoms, just started back on it. Has indigestion worse at night. Recent stress with illness - No longer on Omeprazole PPI  Depression screen [facility] [place] 2/9 08/26/2017 05/27/2017 09/15/2015  Decreased Interest 0 0 0  Down, Depressed, Hopeless 0 0 0  PHQ - 2 Score 0 0 0    Social History   Tobacco Use  . Smoking status: Never Smoker  . Smokeless tobacco: Never Used  Substance Use Topics  . Alcohol use: No    Alcohol/week: 0.0 oz  . Drug use: No    Review of Systems Per HPI unless specifically indicated above     Objective:    BP 127/67   Pulse 82   Temp 98.7 F (37.1 C) (Oral)   Resp 16   Ht 5\' 10"  (1.778 m)   Wt 249 lb 12.8 oz (113.3 kg)   BMI 35.84 kg/m   Wt Readings from Last 3 Encounters:  08/26/17 249 lb 12.8 oz (113.3 kg)  07/25/17 240 lb (108.9 kg)  05/27/17 253 lb (114.8 kg)    Physical Exam  Constitutional: She is oriented to person, place, and time. She appears well-developed and well-nourished. No distress.  Well-appearing, comfortable, cooperative, obese  HENT:  Head: Normocephalic and atraumatic.  Mouth/Throat: Oropharynx is clear and moist.  Eyes: Conjunctivae are normal. Right eye exhibits no discharge. Left eye exhibits no discharge.  Neck: Normal range of motion. Neck supple.  Cardiovascular: Normal rate, regular rhythm, normal heart sounds and intact distal pulses.  No murmur heard. Pulmonary/Chest: Effort normal and breath sounds normal. No respiratory distress. She has no wheezes.  She has no rales.  Good air movement. No focal abnormality, much improved  Musculoskeletal: Normal range of motion. She exhibits no edema.  Lymphadenopathy:    She has no cervical adenopathy.  Neurological: She is alert and oriented to person, place, and time.  Skin: Skin is warm and dry. No rash noted. She is not diaphoretic. No erythema.  Psychiatric: She has a normal mood and affect. Her behavior is normal.  Well groomed, good eye contact, normal speech and thoughts    Nursing note and vitals reviewed.     I have personally reviewed the radiology report from 07/25/17 Chest CT w/ contrast.  CLINICAL DATA:  57 year old female with dyspnea, fever and vomiting since Thursday. Abnormal chest radiograph.  EXAM: CT CHEST WITH CONTRAST  TECHNIQUE: Multidetector CT imaging of the chest was performed during intravenous contrast administration.  CONTRAST:  75mL ISOVUE-300 IOPAMIDOL (ISOVUE-300) INJECTION 61%  COMPARISON:  Same day chest radiograph from 07/25/2017  FINDINGS: Cardiovascular: Normal heart size. No aortic aneurysm. Minimal atherosclerotic calcification at the arch. No large central pulmonary embolus. No pericardial effusion.  Mediastinum/Nodes: Small mediastinal lymph nodes measuring up to 11 mm in the right upper paratracheal portion of the mediastinum. Smaller precarinal and subcarinal lymph nodes on the order of 10 and 8 mm respectively. Patent trachea and mainstem bronchi.  Lungs/Pleura: Pneumonic consolidation in the apical segment of the right upper lobe with air bronchograms compatible with pneumonia. Scattered right upper lobe subpleural nodular opacities are seen likely postinfectious or postinflammatory in etiology as well. Some of the opacities a nodular in appearance up to 6 mm and therefore follow-up is recommended. A solitary 3 mm right middle lobe nodule is identified, series 3, image 65. Dependent atelectasis is noted within both lower lobes with subsegmental atelectasis along the lateral aspect of the left lower lobe. No pleural effusion. No pneumothorax.  Upper Abdomen: No adrenal mass. No space-occupying lesions of the included liver. Gallstones are present within the included gallbladder.  Musculoskeletal: No chest wall abnormality. No acute or significant osseous findings.  IMPRESSION: 1. Right upper lobe apical segment pneumonic consolidation with air bronchograms most compatible with pneumonia.  Followup PA and lateral chest X-ray is recommended in 3-4 weeks following trial of antibiotic therapy to ensure resolution and exclude underlying malignancy. 2. Scattered subpleural airspace opacity some which are nodular in appearance up to 6 mm are identified also believed to be a likely postinfectious or postinflammatory in etiology. As pulmonary nodules are not entirely excluded, follow-up is recommended. Non-contrast chest CT at 3-6 months is recommended. If the nodules are stable at time of repeat CT, then future CT at 18-24 months (from today's scan) is considered optional for low-risk patients, but is recommended for high-risk patients. This recommendation follows the consensus statement: Guidelines for Management of Incidental Pulmonary Nodules Detected on CT Images: From the Fleischner Society 2017; Radiology 2017; 284:228-243. 3. Uncomplicated cholelithiasis.  Aortic Atherosclerosis (ICD10-I70.0).   Electronically Signed   By: Tollie Ethavid  Kwon M.D.   On: 07/25/2017 19:33   Results for orders placed or performed in visit on 08/26/17  POCT HgB A1C  Result Value Ref Range   Hemoglobin A1C 6.1 (A) 5.7   Recent Labs    02/21/17 1306 05/25/17 0820 08/26/17 1615  HGBA1C 6.4* 5.9* 6.1*      Assessment & Plan:   Problem List Items Addressed This Visit    GERD (gastroesophageal reflux disease)    Some refractory symptoms, indigestion Continue Famotidine H2 40mg  BID for now -  at least 2-4 week course back on regular dosing, then if not resolved contact office consider PPI resume      Morbid obesity (HCC)    Clinically consistent with Morbid Obesity with BMI 35 (< 40) due to comorbid conditions with OSA, HLD, GERD Weight gain still abnormal, limited exercise but improved diet, also concern postmenopausal hormonal factor, likely limited wt loss with uncontrolled OSA as well - Prior failed Phentermine med - Improved A1c. Normal TSH  Plan: 1. Some wt loss d/t illness  now gain again - RESUME Victoza titrate dose up as advised 2. Counseling on encourage lifestyle changes to improve regular exercise and continue diet 3. Follow-up 3 months wt check      OSA (obstructive sleep apnea)    Continue to try improve CPAP adherence Has improved breathing at times, if can tolerate mask      Prediabetes - Primary    Relatively stable control Pre-DM with A1c up to 6.1 from 5.9 Concern with obesity, HTN, HLD, OSA  Plan:  1. Continue Metformin XR 1000mg  daily (500mg  tabs) 2. RESUME VIctoza 0.6mg  daily inj - titrate up to max 1.8 gradually as instructed - had stopped due to recent illness, but was tolerating, should help control A1c and wt loss 2. Encourage improved lifestyle - emphasized limit fast food, low carb, low sugar diet, reduce portion size, limit soda/tea inc water, if needed may add more regular exercise in addition to regular work activity 3. Follow-up 3 months PreDM A1c, wt check      Relevant Orders   POCT HgB A1C (Completed)   Pulmonary nodules    Multiple identified on chest CT w contrast 07/25/17, scattered, < 6mm, suspected to be infectious, but radiology rec repeat CT Chest - Patient agrees to proceed with soonest repeat imaging to r/o malignancy or other nodular etiology - Reassuance with asymptomatic now after resolved CAP - Ordered future Chest CT w/o contrast low dose nodule f/u - to be scheduled approx 10/24/17      Relevant Orders   CT CHEST NODULE FOLLOW UP LOW DOSE W/O    Other Visit Diagnoses    Abnormal findings on diagnostic imaging of lung       Relevant Orders   CT CHEST NODULE FOLLOW UP LOW DOSE W/O      No orders of the defined types were placed in this encounter.   Follow up plan: Return in about 3 months (around 11/23/2017) for Pre-DM A1c, Wt check, f/u CT results.  Saralyn Pilar, DO [facility] 08/27/2017, 8:37 AM

## 2017-08-27 NOTE — Assessment & Plan Note (Signed)
Some refractory symptoms, indigestion Continue Famotidine H2 40mg  BID for now - at least 2-4 week course back on regular dosing, then if not resolved contact office consider PPI resume

## 2017-08-27 NOTE — Assessment & Plan Note (Signed)
Clinically consistent with Morbid Obesity with BMI 35 (< 40) due to comorbid conditions with OSA, HLD, GERD Weight gain still abnormal, limited exercise but improved diet, also concern postmenopausal hormonal factor, likely limited wt loss with uncontrolled OSA as well - Prior failed Phentermine med - Improved A1c. Normal TSH  Plan: 1. Some wt loss d/t illness now gain again - RESUME Victoza titrate dose up as advised 2. Counseling on encourage lifestyle changes to improve regular exercise and continue diet 3. Follow-up 3 months wt check

## 2017-08-27 NOTE — Assessment & Plan Note (Signed)
Multiple identified on chest CT w contrast 07/25/17, scattered, < 6mm, suspected to be infectious, but radiology rec repeat CT Chest - Patient agrees to proceed with soonest repeat imaging to r/o malignancy or other nodular etiology - Reassuance with asymptomatic now after resolved CAP - Ordered future Chest CT w/o contrast low dose nodule f/u - to be scheduled approx 10/24/17

## 2017-08-27 NOTE — Assessment & Plan Note (Signed)
Relatively stable control Pre-DM with A1c up to 6.1 from 5.9 Concern with obesity, HTN, HLD, OSA  Plan:  1. Continue Metformin XR 1000mg  daily (500mg  tabs) 2. RESUME VIctoza 0.6mg  daily inj - titrate up to max 1.8 gradually as instructed - had stopped due to recent illness, but was tolerating, should help control A1c and wt loss 2. Encourage improved lifestyle - emphasized limit fast food, low carb, low sugar diet, reduce portion size, limit soda/tea inc water, if needed may add more regular exercise in addition to regular work activity 3. Follow-up 3 months PreDM A1c, wt check

## 2017-08-27 NOTE — Assessment & Plan Note (Signed)
Continue to try improve CPAP adherence Has improved breathing at times, if can tolerate mask 

## 2017-09-23 ENCOUNTER — Ambulatory Visit
Admission: RE | Admit: 2017-09-23 | Discharge: 2017-09-23 | Disposition: A | Discharge status: Home / Self Care | Payer: BC Managed Care – PPO | Source: Ambulatory Visit | Attending: Family Medicine | Admitting: Family Medicine

## 2017-09-23 ENCOUNTER — Ambulatory Visit (INDEPENDENT_AMBULATORY_CARE_PROVIDER_SITE_OTHER): Payer: BC Managed Care – PPO | Admitting: Family Medicine

## 2017-09-23 ENCOUNTER — Encounter: Payer: Self-pay | Admitting: Family Medicine

## 2017-09-23 VITALS — BP 134/69 | HR 85 | Temp 98.4°F | Resp 16 | Ht 70.0 in | Wt 259.0 lb

## 2017-09-23 DIAGNOSIS — G8929 Other chronic pain: Secondary | ICD-10-CM

## 2017-09-23 DIAGNOSIS — M5030 Other cervical disc degeneration, unspecified cervical region: Secondary | ICD-10-CM | POA: Insufficient documentation

## 2017-09-23 DIAGNOSIS — M47816 Spondylosis without myelopathy or radiculopathy, lumbar region: Secondary | ICD-10-CM | POA: Insufficient documentation

## 2017-09-23 DIAGNOSIS — M4722 Other spondylosis with radiculopathy, cervical region: Secondary | ICD-10-CM | POA: Insufficient documentation

## 2017-09-23 DIAGNOSIS — M5136 Other intervertebral disc degeneration, lumbar region: Secondary | ICD-10-CM | POA: Diagnosis not present

## 2017-09-23 DIAGNOSIS — M5442 Lumbago with sciatica, left side: Secondary | ICD-10-CM | POA: Diagnosis not present

## 2017-09-23 MED ORDER — MELOXICAM 15 MG PO TABS: 15.0000 mg | ORAL_TABLET | Freq: Every day | ORAL | 0 refills | Status: DC

## 2017-09-23 MED ORDER — PREDNISONE 20 MG PO TABS: ORAL_TABLET | ORAL | 0 refills | Status: DC

## 2017-09-23 MED ORDER — CYCLOBENZAPRINE HCL 10 MG PO TABS: 5.0000 mg | ORAL_TABLET | Freq: Three times a day (TID) | ORAL | 0 refills | Status: DC | PRN

## 2017-09-23 NOTE — Assessment & Plan Note (Addendum)
Likely etiology for current neck pain/stiff with intermittent paresthesias, seems mild to moderate without significant worsening, concern due to persistent symptoms - No recent imaging available, last MRI 2013 with multilevel DJD and some nerve root compression  Plan Check C-spine X-ray today - showed mild to moderate DDD, worse C6-C7 See A&P for back pain, cover for both spinal DJD problems Ultimately may need to return to Ortho and consider updated adv imaging with MRI and possible injection therapy

## 2017-09-23 NOTE — Assessment & Plan Note (Addendum)
Subacute on chronic L LBP with associated L sciatica. Suspect likely due to muscle spasm/strain, without known injury or trauma. - In setting of known chronic LBP with DJD. prior surgery 1992 disc - No red flag symptoms. Negative SLR for radiculopathy - but some pain L - Responding to high dose ibuprofen otherwise limited therapy  Plan: 1. Check Lumbar Spine X-rays today - see result, with multi level DDD - DC Ibuprofen - Start Meloxicam 15mg  daily wc x 2-4 weeks then PRN - May take Tylenol PRN - Lastly gave rx Prednisone burst/taper over 7 days if not improved (will help neck as well) 2. Start muscle relaxant with Flexeril 10mg  tabs - take 5-10mg  up to TID PRN, titrate up as tolerated - prefer use only at night for sleep 3. Encouraged use of heating pad 1-2x daily for now then PRN - Handout given on piriformis exercises, also has existing back regimen from prior PT 4. Follow-up 4-6 weeks re-evaluation. If not improved consider other med options such as gabapentin / baclofen, referral to formal PT vs Ortho, MRI, injections

## 2017-09-23 NOTE — Patient Instructions (Addendum)
Thank you for coming to the office today.  1. For your Back Pain - I think that this is due to Muscle Spasms or strain. Your Sciatic Nerve is affected causing some of your radiation and numbness down your legs. 2. Start with anti-inflammatory Meloxicam 15mg  once daily every day for next 2 to 4 weeks if helping, then can use only as needed 3. Start Cyclobenzapine (Flexeril) 10mg  tablets - cut in half for 5mg  at night for muscle relaxant - may make you sedated or sleepy (be careful driving or working on this) if tolerated you can take every 8 hours, half or whole tab 4. May use Tylenol Extra Str 500mg  tabs - may take 1-2 tablets every 6 hours as needed 5. Recommend to start using heating pad on your lower back 1-2x daily for few weeks  If pain is not improving on Meloxicam and above treatment - may HOLD meloxicam temporarily and start the Prednisone taper as prescribed. When finished resume Meloxicam.  X-rays today for Neck and Back  [facility] need [facility] and re-evaluate with PT and possibly injections again in future.  ------------------------------------------------------------------------  This pain may take weeks to months to fully resolve, but hopefully it will respond to the medicine initially. All back injuries (small or serious) are slow to heal since we use our back muscles every day. Be careful with turning, twisting, lifting, sitting / standing for prolonged periods, and avoid re-injury.  If your symptoms significantly worsen with more pain, or new symptoms with weakness in one or both legs, new or different shooting leg pains, numbness in legs or groin, loss of control or retention of urine or bowel movements, please call back for advice and you may need to go directly to the Emergency Department.   Please schedule a Follow-up Appointment to: Return in about 4 weeks (around 10/21/2017) for Neck / Back Pain.  If you have any other questions or concerns,  please feel free to call the office or send a message through MyChart. You may also schedule an earlier appointment if necessary.  Additionally, you may be receiving a survey about your experience at our office within a few days to 1 week by e-mail or mail. We value your feedback.  Ashley PilarAlexander Rafia Shedden, DO [facility], [facility]     Piriformis Syndrome Rehabilitation Exercises   You may do all of these exercises right away once acute pain starts to improve on medication or treatment.  Piriformis stretch: Lying on your back with both knees bent, rest the ankle of your injured leg over the knee of your uninjured leg. Grasp the thigh of your uninjured leg and pull that knee toward your chest. You will feel a stretch along the buttocks and possibly along the outside of your hip on the injured side. Hold this for 15 to 30 seconds. Repeat 3 times.  Standing hamstring stretch: Place the heel of your leg on a stool about 15 inches high. Keep your knee straight. Lean forward, bending at the hips until you feel a mild stretch in the back of your thigh. Make sure you do not roll your shoulders and bend at the waist when doing this or you will stretch your lower back instead. Hold the stretch for 15 to 30 seconds. Repeat 3 times.  Pelvic tilt: Lie on your back with your knees bent and your feet flat on the floor. Tighten your abdominal muscles and push your lower back into the  floor. Hold this position for 5 seconds, then relax. Do 3 sets of 10.  Partial curl: Lie on your back with your knees bent and your feet flat on the floor. Tighten your stomach muscles and flatten your back against the floor. Tuck your chin to your chest. With your hands stretched out in front of you, curl your upper body forward until your shoulders clear the floor. Hold this position for 3 seconds. Don't hold your breath. It helps to breathe out as you lift your shoulders up. Relax. Repeat 10 times. Build to 3 sets of 10.  To challenge yourself, clasp your hands behind your head and keep your elbows out to the side.  Prone hip extension: Lie on your stomach with your legs straight out behind you. Tighten up your buttocks muscles and lift one leg off the floor about 8 inches. Keep your knee straight. Hold for 5 seconds. Then lower your leg and relax. Do 3 sets of 10.  If both legs are affected, you may repeat this exercise for the other leg.

## 2017-09-23 NOTE — Progress Notes (Signed)
Subjective:    Patient ID: Ashley Hale, female    DOB: [birth year], 59 y.o.   MRN: [id number]  Ashley Hale is a 57 y.o. female presenting on 09/23/2017 for Back Pain  Patient presents for a same day appointment.  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 Seems to be not improved or worsening. Describes pain as moderate to severe at times with intermittent worsening and radiating pain down Left leg, sometimes localized in gluteal and down back of leg. Worse at night when lay down, worse with some activity. - Additional current complaint with some neck stiffness and pain, mostly R>L, with some intermittent paresthesias down both arms, usually provoked by sitting and raising her arms - Taking Ibuprofen 200mg  x 4 for 800mg  every 4 hours with mild temporary - Tylenol is ineffective, tried high dose - Tried heating pad at night with mild relief - Recently had prednisone taper for bronchitis with relief for her neck - Significant past history of both neck and back pain. She has had chronic history of back problems with OA/DJD and herniated disc, dating back to 1992 with back surgery for disc, she had epidural in pregnancy that did not take, more recently followed by [facility] Dr Rosita Kea back in 2013. She has not had surgery on C-spine. She has had PT and injections in past - Admits difficulty sleeping due to pain - Denies any fevers/chills, numbness, tingling, weakness, loss of control bladder/bowel incontinence or retention, unintentional wt loss, night sweats   Depression screen [facility] 2/9 08/26/2017 05/27/2017 09/15/2015  Decreased Interest 0 0 0  Down, Depressed, Hopeless 0 0 0  PHQ - 2 Score 0 0 0    Social History   Tobacco Use  . Smoking status: Never Smoker  . Smokeless tobacco: Never Used    Substance Use Topics  . Alcohol use: No    Alcohol/week: 0.0 oz  . Drug use: No    Review of Systems Per HPI unless specifically indicated above     Objective:    BP 134/69   Pulse 85   Temp 98.4 F (36.9 C) (Other (Comment))   Resp 16   Ht 5\' 10"  (1.778 m)   Wt 259 lb (117.5 kg)   BMI 37.16 kg/m   Wt Readings from Last 3 Encounters:  09/23/17 259 lb (117.5 kg)  08/26/17 249 lb 12.8 oz (113.3 kg)  07/25/17 240 lb (108.9 kg)    Physical Exam  Constitutional: She is oriented to person, place, and time. She appears well-developed and well-nourished. No distress.  Well-appearing, slightly uncomfortable L low back gluteal discomfort, cooperative  HENT:  Head: Normocephalic and atraumatic.  Mouth/Throat: Oropharynx is clear and moist.  Eyes: Conjunctivae are normal. Right eye exhibits no discharge. Left eye exhibits no discharge.  Neck: Normal range of motion. Neck supple.  Neck Inspection: mostly normal Palpation: slight paraspinal muscle spasm hypertonicity R side lower cervical muscles and trapezius ROM: mostly intact flex ext, slightly reduced R rotation, compared to L Special Testing: Spurling's Maneuver positive on R side with some radiating vs radicular pain into arm Strength: distal strength upper ext intact Neurovascular: distal intact  Cardiovascular: Normal rate, regular rhythm, normal heart sounds and intact distal pulses.  No murmur heard. Pulmonary/Chest: Effort normal and breath sounds normal. No  respiratory distress. She has no wheezes. She has no rales.  Remains improved  Musculoskeletal: She exhibits no edema.  Low Back Inspection: Normal appearance, Large body habitus, no spinal deformity, symmetrical. Palpation: No tenderness over spinous processes. Lower Bilateral lumbar paraspinal muscles non-tender to palpation but some mild hypertonicity/spasm L > R, localized symptoms into SI and piriformis L side gluteal ROM: Mostly full active ROM forward flex /  back extension, rotation L/R Special Testing: Seated SLR negative for radicular pain bilaterally but with some reproduced pain L lower back on L SLR, pulling and muscle tight possibly some radiating pain provoked.  Strength: Bilateral hip flex/ext 5/5, knee flex/ext 5/5, ankle dorsiflex/plantarflex 5/5 Neurovascular: intact distal sensation to light touch  Lymphadenopathy:    She has no cervical adenopathy.  Neurological: She is alert and oriented to person, place, and time.  Skin: Skin is warm and dry. No rash noted. She is not diaphoretic. No erythema.  Psychiatric: Her behavior is normal.  Nursing note and vitals reviewed.    I have personally reviewed the radiology report from Cervical Spine and Lumbar Spine X-ray on 09/23/17.   CLINICAL DATA: Acute on chronic neck pain with bilateral upper extremity paresthesias.  EXAM: CERVICAL SPINE - COMPLETE 4+ VIEW  COMPARISON: None.  FINDINGS: No fracture or spondylolisthesis is noted. Mild degenerative disc disease is noted at C5-6 with anterior osteophyte formation. Moderate degenerative disc disease is noted at C6-7 with anterior osteophyte formation. Remaining disc spaces are intact. No neural foraminal stenosis is noted. Degenerative changes seen involving the facet joints bilaterally.  IMPRESSION: Multilevel degenerative disc disease. No acute abnormality seen in the cervical spine.   Electronically Signed By: Lupita RaiderJames Green Jr, M.D. On: 09/23/2017 16:40  -------------------------------------------------------------------  CLINICAL DATA: Chronic left-sided low back pain with left-sided sciatica.  EXAM: LUMBAR SPINE - COMPLETE 4+ VIEW  COMPARISON: None.  FINDINGS: No fracture or spondylolisthesis is noted. Moderate degenerative disc disease is noted at L3-4. Severe degenerative disc disease is noted at L5-S1.  IMPRESSION: Multilevel degenerative disc disease. No acute abnormality seen in the lumbar  spine.   Electronically Signed By: Lupita RaiderJames Green Jr, M.D. On: 09/23/2017 16:37      Assessment & Plan:   Problem List Items Addressed This Visit    Chronic left-sided low back pain with left-sided sciatica - Primary    Subacute on chronic L LBP with associated L sciatica. Suspect likely due to muscle spasm/strain, without known injury or trauma. - In setting of known chronic LBP with DJD. prior surgery 1992 disc - No red flag symptoms. Negative SLR for radiculopathy - but some pain L - Responding to high dose ibuprofen otherwise limited therapy  Plan: 1. Check Lumbar Spine X-rays today - see result, with multi level DDD - DC Ibuprofen - Start Meloxicam 15mg  daily wc x 2-4 weeks then PRN - May take Tylenol PRN - Lastly gave rx Prednisone burst/taper over 7 days if not improved (will help neck as well) 2. Start muscle relaxant with Flexeril 10mg  tabs - take 5-10mg  up to TID PRN, titrate up as tolerated - prefer use only at night for sleep 3. Encouraged use of heating pad 1-2x daily for now then PRN - Handout given on piriformis exercises, also has existing back regimen from prior PT 4. Follow-up 4-6 weeks re-evaluation. If not improved consider other med options such as gabapentin / baclofen, referral to formal PT vs Ortho, MRI, injections      Relevant Medications   meloxicam (MOBIC) 15 MG tablet  cyclobenzaprine (FLEXERIL) 10 MG tablet   predniSONE (DELTASONE) 20 MG tablet   Other Relevant Orders   DG Lumbar Spine Complete (Completed)   Osteoarthritis of spine with radiculopathy, cervical region    Likely etiology for current neck pain/stiff with intermittent paresthesias, seems mild to moderate without significant worsening, concern due to persistent symptoms - No recent imaging available, last MRI 2013 with multilevel DJD and some nerve root compression  Plan Check C-spine X-ray today - showed mild to moderate DDD, worse C6-C7 See A&P for back pain, cover for both  spinal DJD problems Ultimately may need to return to Ortho and consider updated adv imaging with MRI and possible injection therapy      Relevant Medications   meloxicam (MOBIC) 15 MG tablet   cyclobenzaprine (FLEXERIL) 10 MG tablet   predniSONE (DELTASONE) 20 MG tablet   Other Relevant Orders   DG Cervical Spine Complete (Completed)   Spondylosis of lumbar region without myelopathy or radiculopathy    Presumed underlying etiology for acute on chronic low back pain, and associated L sided sciatica vs gluteal/piriformis symptoms - See A&P - Ultimately may need to return to Ortho for consider adv imaging MRI and possible injection therapy      Relevant Medications   meloxicam (MOBIC) 15 MG tablet   cyclobenzaprine (FLEXERIL) 10 MG tablet   predniSONE (DELTASONE) 20 MG tablet   Other Relevant Orders   DG Lumbar Spine Complete (Completed)      Meds ordered this encounter  Medications  . meloxicam (MOBIC) 15 MG tablet    Sig: Take 1 tablet (15 mg total) by mouth daily. For 2-4 weeks then as needed    Dispense:  30 tablet    Refill:  0  . cyclobenzaprine (FLEXERIL) 10 MG tablet    Sig: Take 0.5-1 tablets (5-10 mg total) by mouth 3 (three) times daily as needed for muscle spasms. Recommend start at night only    Dispense:  30 tablet    Refill:  0  . predniSONE (DELTASONE) 20 MG tablet    Sig: If not improved, Take daily w/ food. Start with 60mg  (3 pills) x 2 days, then reduce to 40mg  (2 pills) x 2 days, then 20mg  (1 pill) x 3 day    Dispense:  13 tablet    Refill:  0     Follow up plan: Return in about 4 weeks (around 10/21/2017) for Neck / Back Pain.  Saralyn Pilar, DO [facility] 09/23/2017, 10:35 PM

## 2017-09-23 NOTE — Assessment & Plan Note (Signed)
Presumed underlying etiology for acute on chronic low back pain, and associated L sided sciatica vs gluteal/piriformis symptoms - See A&P - Ultimately may need to return to Ortho for consider adv imaging MRI and possible injection therapy

## 2017-10-14 ENCOUNTER — Other Ambulatory Visit: Payer: Self-pay | Admitting: Family Medicine

## 2017-10-14 DIAGNOSIS — N951 Menopausal and female climacteric states: Secondary | ICD-10-CM

## 2017-10-21 ENCOUNTER — Ambulatory Visit: Payer: BC Managed Care – PPO | Admitting: Family Medicine

## 2017-11-28 ENCOUNTER — Ambulatory Visit (INDEPENDENT_AMBULATORY_CARE_PROVIDER_SITE_OTHER): Payer: BC Managed Care – PPO | Admitting: Family Medicine

## 2017-11-28 ENCOUNTER — Encounter: Payer: Self-pay | Admitting: Family Medicine

## 2017-11-28 VITALS — BP 127/82 | HR 75 | Temp 98.7°F | Resp 16 | Ht 70.0 in | Wt 247.0 lb

## 2017-11-28 DIAGNOSIS — G8929 Other chronic pain: Secondary | ICD-10-CM | POA: Diagnosis not present

## 2017-11-28 DIAGNOSIS — M47816 Spondylosis without myelopathy or radiculopathy, lumbar region: Secondary | ICD-10-CM

## 2017-11-28 DIAGNOSIS — F5102 Adjustment insomnia: Secondary | ICD-10-CM | POA: Diagnosis not present

## 2017-11-28 DIAGNOSIS — F4323 Adjustment disorder with mixed anxiety and depressed mood: Secondary | ICD-10-CM

## 2017-11-28 DIAGNOSIS — R7303 Prediabetes: Secondary | ICD-10-CM | POA: Diagnosis not present

## 2017-11-28 DIAGNOSIS — M5442 Lumbago with sciatica, left side: Secondary | ICD-10-CM

## 2017-11-28 LAB — POCT GLYCOSYLATED HEMOGLOBIN (HGB A1C): Hemoglobin A1C: 6 — AB (ref ?–5.7)

## 2017-11-28 MED ORDER — LIRAGLUTIDE 18 MG/3ML ~~LOC~~ SOPN: 1.8000 mg | PEN_INJECTOR | Freq: Every day | SUBCUTANEOUS | 2 refills | Status: DC

## 2017-11-28 MED ORDER — ESCITALOPRAM OXALATE 10 MG PO TABS: 10.0000 mg | ORAL_TABLET | Freq: Every day | ORAL | 2 refills | Status: DC

## 2017-11-28 MED ORDER — CYCLOBENZAPRINE HCL 10 MG PO TABS: 5.0000 mg | ORAL_TABLET | Freq: Three times a day (TID) | ORAL | 2 refills | Status: AC | PRN

## 2017-11-28 MED ORDER — HYDROXYZINE HCL 25 MG PO TABS: 25.0000 mg | ORAL_TABLET | Freq: Every evening | ORAL | 0 refills | Status: DC | PRN

## 2017-11-28 NOTE — Patient Instructions (Addendum)
Thank you for coming to the office today.  For mood and anxiety, Most likely from adjustment disorder or due to current life stressors as discussed  REDUCE Paxil from  - to  (CUT IN HALF) for 3 to 5 days - until stable  THEN START Lexapro (escitalopram)  daily - may help sleep once effective, takes a few weeks to get into system - Should control anxiety and mood on a daily basis - May need to inc to  - by 4-6 weeks, call or notify office - Also rx Hydroxyzine 1-2 pills as needed at bedtime  Also rx refill Flexeril may use as needed at bedtime to help sleep  WAIT 4-6 weeks after Lexapro change before inc victoza - Then INCREASE Victoza to 1.8mg  daily injection (highest dose for now) to see if sugar and weight improve further  Recommend that you return for nurse visit for Pneumovax-23 - after 3-4 weeks after you see how medicine changes effect you  We will work on ordering CT repeat image - stay tuned  Please schedule a Follow-up Appointment to: Return in about 3 months (around 02/28/2018) for PreDM A1c, Mood/Anxiety (GAD/PHQ) med adjust.  If you have any other questions or concerns, please feel free to call the office or send a message through MyChart. You may also schedule an earlier appointment if necessary.  Additionally, you may be receiving a survey about your experience at our office within a few days to 1 week by e-mail or mail. We value your feedback.  Saralyn Pilar, DO [facility] [place], [place]

## 2017-11-28 NOTE — Progress Notes (Signed)
Subjective:    Patient ID: Ashley Hale, female    DOB: November 03, 1960, 57 y.o.   MRN: [id number]  Ashley Hale is a 57 y.o. female presenting on 11/28/2017 for prediabetes   HPI   Acute on chronic Adjustment Disorder Specific trigger related to father passing 09/05/17. She has 2 brothers siblings and they are having difficulty arranging father's estate. Causing her acute stress, also sadness with passing of her father. Gradually worse over past 3 months - Difficulty with sleeping, she is able to go to sleep but seems to not be able to stay asleep - She is worrying and does not like conflict, thinks mood is down because of the worry No prior diagnosis Thinks black cohosh helping hot flashes, she takes paxil  nightly regularly at night some help wit hot flash - See PHQ and GAD for symptoms - Denies suicidal or homicidal ideation  Pre-Diabetes /Obesity BMI >35 A1c now down to 6.0, from recent 6.1 Doing well on new GLP over past 5 months CBGs:Not checking CBG Meds: Victoza 1.2mg  [place] daily,Metformin XR daily (x 2 of  tabs) Admits reduced appetite, doesn't feel as full Reports good compliance. Tolerating well w/o side-effects Currentlynot onACEi / ARB Lifestyle: Back down 12 lbs in 2 months - Diet (still improving diet) - Exercise (had improved exercise but then setback with illness, plans to resume) Denies hypoglycemia, polyuria, visual changes, numbness or tingling.   Depression screen [facility] 2/9 11/28/2017 11/28/2017 08/26/2017  Decreased Interest 1 0 0  Down, Depressed, Hopeless 2 0 0  PHQ - 2 Score 3 0 0  Altered sleeping 3 - -  Tired, decreased energy 2 - -  Change in appetite 1 - -  Feeling bad or failure about yourself  3 - -  Trouble concentrating 2 - -  Moving slowly or fidgety/restless 1 - -  Suicidal thoughts 0 - -  PHQ-9 Score 15 - -  Difficult doing work/chores Not difficult at all - -   GAD 7 : Generalized Anxiety Score 11/28/2017  Nervous, Anxious,  on Edge 3  Control/stop worrying 2  Worry too much - different things 2  Trouble relaxing 2  Restless 2  Easily annoyed or irritable 1  Afraid - awful might happen 0  Total GAD 7 Score 12  Anxiety Difficulty Not difficult at all     Social History   Tobacco Use  . Smoking status: Never Smoker  . Smokeless tobacco: Never Used  Substance Use Topics  . Alcohol use: No    Alcohol/week: 0.0 oz  . Drug use: No    Review of Systems Per HPI unless specifically indicated above     Objective:    BP 127/82   Pulse 75   Temp 98.7 F (37.1 C) (Oral)   Resp 16   Ht  (1.778 m)   Wt 247 lb (112 kg)   BMI 35.44 kg/m   Wt Readings from Last 3 Encounters:  11/28/17 247 lb (112 kg)  09/23/17 259 lb (117.5 kg)  08/26/17 249 lb 12.8 oz (113.3 kg)    Physical Exam  Constitutional: She is oriented to person, place, and time. She appears well-developed and well-nourished. No distress.  Well-appearing, comfortable, cooperative  HENT:  Head: Normocephalic and atraumatic.  Mouth/Throat: Oropharynx is clear and moist.  Eyes: Conjunctivae are normal. Right eye exhibits no discharge. Left eye exhibits no discharge.  Neck: Normal range of motion. Neck supple. No thyromegaly present.  Cardiovascular: Normal rate, regular rhythm, normal  heart sounds and intact distal pulses.  No murmur heard. Pulmonary/Chest: Effort normal and breath sounds normal. No respiratory distress. She has no wheezes. She has no rales.  Musculoskeletal: Normal range of motion. She exhibits no edema.  Lymphadenopathy:    She has no cervical adenopathy.  Neurological: She is alert and oriented to person, place, and time.  Skin: Skin is warm and dry. No rash noted. She is not diaphoretic. No erythema.  Psychiatric: She has a normal mood and affect. Her behavior is normal.  Well groomed, good eye contact, normal speech and thoughts  Nursing note and vitals reviewed.    Recent Labs    05/25/17 0820  08/26/17 1615 11/28/17 1638  HGBA1C 5.9* 6.1* 6.0*    Results for orders placed or performed in visit on 11/28/17  POCT HgB A1C  Result Value Ref Range   Hemoglobin A1C 6.0 (A) 5.7        Assessment & Plan:   Problem List Items Addressed This Visit    Chronic left-sided low back pain with left-sided sciatica   Relevant Medications   hydrOXYzine (ATARAX/VISTARIL) 25 MG tablet   escitalopram (LEXAPRO) 10 MG tablet   cyclobenzaprine (FLEXERIL) 10 MG tablet   Morbid obesity (HCC)   Relevant Medications   liraglutide (VICTOZA) 18 MG/3ML SOPN   Prediabetes - Primary    Stable control Pre-DM with A1c 6.0 from prior 6.1 Concern with obesity, HTN, HLD, OSA  Plan:  1. INCREASE Victoza from 1.2 to 1.8mg  [place] daily inj - new rx sent - CONTINUE Metformin XR  daily (  tabs) 2. Encourage improved lifestyle - diet / exercise 3. Follow-up 3 months PreDM A1c, wt check - then likely annual phys 05/2018      Relevant Medications   liraglutide (VICTOZA) 18 MG/3ML SOPN   Other Relevant Orders   POCT HgB A1C (Completed)   Spondylosis of lumbar region without myelopathy or radiculopathy   Relevant Medications   cyclobenzaprine (FLEXERIL) 10 MG tablet    Other Visit Diagnoses    Adjustment insomnia       Relevant Medications   hydrOXYzine (ATARAX/VISTARIL) 25 MG tablet   escitalopram (LEXAPRO) 10 MG tablet   Adjustment disorder with mixed anxiety and depressed mood     Suspected secondary to acute life stressors with father passed and stress from estate arrangements with her siblings, now with poor sleep cannot stay asleep, mixed anxiety provoking mood symptoms, no prior dx in past or therapy.  Plan Discussion on med options - was already on low dose Paxil  nightly for hot flashes, agree to taper off down to  for 3-5 days then start new SSRI Lexapro  daily - may titrate up to  after 4-6 weeks if ineffective - Add Hydroxyzine PRN QHS if wake up - Offered referral  for counseling, she has declined - Follow-up sooner within 3 months    Relevant Medications   hydrOXYzine (ATARAX/VISTARIL) 25 MG tablet   escitalopram (LEXAPRO) 10 MG tablet      Meds ordered this encounter  Medications  . hydrOXYzine (ATARAX/VISTARIL) 25 MG tablet    Sig: Take 1-2 tablets (25-50 mg total) by mouth at bedtime as needed for anxiety (insomnia).    Dispense:  30 tablet    Refill:  0  . escitalopram (LEXAPRO) 10 MG tablet    Sig: Take 1 tablet (10 mg total) by mouth daily.    Dispense:  30 tablet    Refill:  2  . liraglutide (VICTOZA) 18 MG/3ML  SOPN    Sig: Inject 0.3 mLs (1.8 mg total) into the skin daily.    Dispense:  9 mL    Refill:  2    Dose change  . cyclobenzaprine (FLEXERIL) 10 MG tablet    Sig: Take 0.5-1 tablets (5-10 mg total) by mouth 3 (three) times daily as needed for muscle spasms. Recommend start at night only    Dispense:  30 tablet    Refill:  2    Follow up plan: Return in about 3 months (around 02/28/2018) for PreDM A1c, Mood/Anxiety (GAD/PHQ) med adjust.  Due for Pneumovax-23 injection due to recurrent pneumonia - will return as nurse visit for this in near future.  Saralyn Pilar, DO [facility] 11/29/2017, 1:56 AM

## 2017-11-29 ENCOUNTER — Encounter: Payer: Self-pay | Admitting: Family Medicine

## 2017-11-29 NOTE — Assessment & Plan Note (Signed)
Stable control Pre-DM with A1c 6.0 from prior 6.1 Concern with obesity, HTN, HLD, OSA  Plan:  1. INCREASE Victoza from 1.2 to 1.8mg  [name] daily inj - new rx sent - CONTINUE Metformin XR  daily (  tabs) 2. Encourage improved lifestyle - diet / exercise 3. Follow-up 3 months PreDM A1c, wt check - then likely annual phys 05/2018

## 2017-12-05 ENCOUNTER — Other Ambulatory Visit: Payer: Self-pay | Admitting: Family Medicine

## 2017-12-05 DIAGNOSIS — R918 Other nonspecific abnormal finding of lung field: Secondary | ICD-10-CM

## 2017-12-13 ENCOUNTER — Ambulatory Visit
Admission: RE | Admit: 2017-12-13 | Discharge: 2017-12-13 | Disposition: A | Discharge status: Home / Self Care | Payer: BC Managed Care – PPO | Source: Ambulatory Visit | Attending: Family Medicine | Admitting: Family Medicine

## 2017-12-13 DIAGNOSIS — Z09 Encounter for follow-up examination after completed treatment for conditions other than malignant neoplasm: Secondary | ICD-10-CM | POA: Diagnosis not present

## 2017-12-13 DIAGNOSIS — R918 Other nonspecific abnormal finding of lung field: Secondary | ICD-10-CM | POA: Insufficient documentation

## 2017-12-13 DIAGNOSIS — K8020 Calculus of gallbladder without cholecystitis without obstruction: Secondary | ICD-10-CM | POA: Insufficient documentation

## 2018-02-08 ENCOUNTER — Encounter: Payer: Self-pay | Admitting: Family Medicine

## 2018-02-08 ENCOUNTER — Other Ambulatory Visit: Payer: Self-pay

## 2018-02-08 ENCOUNTER — Ambulatory Visit: Payer: BC Managed Care – PPO | Admitting: Family Medicine

## 2018-02-08 VITALS — BP 121/67 | HR 69 | Temp 98.6°F | Ht 70.0 in | Wt 258.0 lb

## 2018-02-08 DIAGNOSIS — H6981 Other specified disorders of Eustachian tube, right ear: Secondary | ICD-10-CM | POA: Diagnosis not present

## 2018-02-08 DIAGNOSIS — J0100 Acute maxillary sinusitis, unspecified: Secondary | ICD-10-CM | POA: Diagnosis not present

## 2018-02-08 MED ORDER — FLUTICASONE PROPIONATE 50 MCG/ACT NA SUSP: 2.0000 | Freq: Every day | NASAL | 3 refills | Status: DC

## 2018-02-08 MED ORDER — AMOXICILLIN 500 MG PO CAPS: 500.0000 mg | ORAL_CAPSULE | Freq: Two times a day (BID) | ORAL | 0 refills | Status: DC

## 2018-02-08 NOTE — Patient Instructions (Addendum)
Thank you for coming to the office today.  1. It sounds like you have persistent Sinus Congestion or "Rhinosinusitis" - I do not think that this is a Bacterial Sinus Infection. Usually these are caused by Viruses or Allergies, and will run it's course in about 7 to 10 days. - No antibiotics are needed today - Given early changes in R ear - and sinuses - we will cover with a backup plan rx Amoxicillin - start only if not improved or worsening without the next 48 hours - fever chills sinus pain pressure or worse congestion  - Start nasal steroid Flonase 2 sprays in each nostril daily for 4-6 weeks, may repeat course seasonally or as needed - Continue Loratadine (Claritin) 10mg  daily   - Recommend to start using Nasal Saline spray multiple times a day to help flush out congestion and clear sinuses - Improve hydration by drinking plenty of clear fluids (water, gatorade) to reduce secretions and thin congestion - Congestion draining down throat can cause irritation. May try warm herbal tea with honey, cough drops - Can take Tylenol or Ibuprofen as needed for fevers - May try OTC Mucinex (or may try Mucinex-DM for cough) up to 7-10 days then stop  You have some Eustachian Tube Dysfunction, this problem is usually caused by some deeper sinus swelling and pressure, causing difficulty of eustachian tubes to clear fluid from behind ear drum. You can have ear pain, pressure, fullness, loss of hearing. Often related to sinus symptoms and sometimes with sinusitis or infection or allergy symptoms.  - For significant ear/sinus pressure, try OTC decongestant such as Phenylephrine or similar medicines, included in DayQuil, take this for up to 1 week or less, no longer  Look into "Galbreath Technique" for ear effusion, can do this simple massage 1-2 x daily for few days then stop. And use as needed. May also use a warm moist wash cloth to help loosen up this tissue before or after doing this to help open up  eustachian tube.  If any significant worsening, loss of hearing, constant pain, fever/chills, or concern for infection - notify office and we can send in an antibiotic. Or if just persistent pressure that is not improving, you may contact me back within 1 week and we can consider a brief course of oral steroid prednisone for 3 days only to help reduce swelling, as discussed this is not ideal treatment and can cause side effects.    If you develop persistent fever >101F for at least 3 consecutive days, headaches with sinus pain or pressure or persistent earache, please schedule a follow-up evaluation within next few days to week.  Please schedule a Follow-up Appointment to: Return in about 2 weeks (around 02/22/2018), or if symptoms worsen or fail to improve, for R ear / sinusitis.  If you have any other questions or concerns, please feel free to call the office or send a message through MyChart. You may also schedule an earlier appointment if necessary.  Additionally, you may be receiving a survey about your experience at our office within a few days to 1 week by e-mail or mail. We value your feedback.  Saralyn PilarAlexander Jerad Dunlap, DO [facility], [place]

## 2018-02-08 NOTE — Progress Notes (Signed)
Subjective:    Patient ID: Ashley Hale, female    DOB: [birth year], 58 y.o.   MRN: [id number]  Ashley Hale is a 57 y.o. female presenting on 02/08/2018 for Ear Pain (Right side ear pain that radiates down the neck x 4 days )   HPI   Right Ear Pain / Eustachian Tube Dysfunction vs Sinusitis Reports symptoms started about 4 days ago with gradual onset with R ear and R sided neck pain, seems more deeper pain and pressure also with nasal congestion. Pain radiates into R side of neck. Feels some pressure behind R ear. But she denies sore throat. She was initial concern for dental pain but does not endorse this and no problems with teeth or crowns. - She returned recently from vacation, thought it may be related to being outdoors by campfire and some pressure changes with mountains. 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 She does not wear mask. - Denies any fevers chills, nasal purulence, headache, vision changes, hearing loss, vision loss, rash  Additional Update - Last visit 11/2017, transitioned off paxil to Lexapro and only took for about 1 month until she had refractory hot flashes and increased appetite, then she weaned off lexapro and resumed paroxetine for past 1 month now. Next apt due in 02/2018  Depression screen [facility] 2/9 11/28/2017 11/28/2017 08/26/2017  Decreased Interest 1 0 0  Down, Depressed, Hopeless 2 0 0  PHQ - 2 Score 3 0 0  Altered sleeping 3 - -  Tired, decreased energy 2 - -  Change in appetite 1 - -  Feeling bad or failure about yourself  3 - -  Trouble concentrating 2 - -  Moving slowly or fidgety/restless 1 - -  Suicidal thoughts 0 - -  PHQ-9 Score 15 - -  Difficult doing work/chores Not difficult at all - -    Social History   Tobacco Use  . Smoking status: Never Smoker  . Smokeless tobacco: Never Used  Substance Use Topics  . Alcohol use: No    Alcohol/week:  0.0 oz  . Drug use: No    Review of Systems Per HPI unless specifically indicated above     Objective:    BP 121/67 (BP Location: Right Arm, Patient Position: Sitting, Cuff Size: Large)   Pulse 69   Temp 98.6 F (37 C) (Oral)   Ht 5\' 10"  (1.778 m)   Wt 258 lb (117 kg)   BMI 37.02 kg/m   Wt Readings from Last 3 Encounters:  02/08/18 258 lb (117 kg)  11/28/17 247 lb (112 kg)  09/23/17 259 lb (117.5 kg)    Physical Exam  Constitutional: She is oriented to person, place, and time. She appears well-developed and well-nourished. No distress.  Well-appearing, comfortable, cooperative  HENT:  Head: Normocephalic and atraumatic.  Mouth/Throat: Oropharynx is clear and moist.  Frontal sinus nontender. R maxillary sinuses mild tender. Nares mostly patent with some deeper turbinate edema without purulence or congestion.  R TM with slightly opaque cloudy appearance, without obvious effusion. No bulging. No erythema. No cerumen impaction or other abnormality within canal.  L TM clear without erythema, effusion or bulging.  Oropharynx clear without erythema, exudates, edema or asymmetry.  Eyes: Conjunctivae are normal. Right eye exhibits no discharge. Left eye exhibits no discharge.  Neck: Normal range of motion. Neck supple. No thyromegaly present.  Cardiovascular: Normal rate, regular rhythm,  normal heart sounds and intact distal pulses.  No murmur heard. Pulmonary/Chest: Effort normal and breath sounds normal. No respiratory distress. She has no wheezes. She has no rales.  Musculoskeletal: Normal range of motion. She exhibits no edema.  Lymphadenopathy:    She has no cervical adenopathy.  Neurological: She is alert and oriented to person, place, and time.  Skin: Skin is warm and dry. No rash noted. She is not diaphoretic. No erythema.  Psychiatric: She has a normal mood and affect. Her behavior is normal.  Well groomed, good eye contact, normal speech and thoughts  Nursing note and  vitals reviewed.  Results for orders placed or performed in visit on 11/28/17  POCT HgB A1C  Result Value Ref Range   Hemoglobin A1C 6.0 (A) 5.7      Assessment & Plan:   Problem List Items Addressed This Visit    None    Visit Diagnoses    Acute non-recurrent maxillary sinusitis    -  Primary   Relevant Medications   fluticasone (FLONASE) 50 MCG/ACT nasal spray   amoxicillin (AMOXIL) 500 MG capsule   Acute dysfunction of right eustachian tube       Relevant Medications   fluticasone (FLONASE) 50 MCG/ACT nasal spray      Acute R sided eustachian tube dysfunction with secondary R > L mild opaque ear effusion, without loss of hearing or evidence of AOM or acute sinusitis at this time. Likely related allergic rhinosinusitis based on exam and history. 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 May have early formation of sinusitis given maxillary sinus pain.  Plan: 1. Re-start Flonase 2 sprays each nare daily for up to 4-6 weeks or longer 2. Continue OTC oral anti-histamine daily 3. May try trial OTC oral decongestant for up to 1 week or less 4. Demonstrated Galbreath Technique today. Advised can use warm moist heat in his area and may try this technique as demonstrated 1-2x daily 2-3 days then stop, avoid over-use 5. Use nasal saline after allergen exposures 6. Agree to given printed empiric Amoxicillin 500 BID x 10 day rx may fill >48 hours if not improving or worsening 7. Follow-up if not improved or worsening, return criteria   Meds ordered this encounter  Medications  . fluticasone (FLONASE) 50 MCG/ACT nasal spray    Sig: Place 2 sprays into both nostrils daily. Use for 4-6 weeks then stop and use seasonally or as needed.    Dispense:  16 g    Refill:  3  . amoxicillin (AMOXIL) 500 MG capsule    Sig: Take 1 capsule (500 mg total) by mouth 2 (two) times daily. For 10 days    Dispense:  20 capsule    Refill:  0    Follow up plan: Return in about 2  weeks (around 02/22/2018), or if symptoms worsen or fail to improve, for R ear / sinusitis.  Saralyn PilarAlexander Karamalegos, DO [facility] 02/08/2018, 9:08 AM

## 2018-04-21 ENCOUNTER — Ambulatory Visit: Payer: BC Managed Care – PPO | Admitting: Family Medicine

## 2018-04-21 ENCOUNTER — Encounter: Payer: Self-pay | Admitting: Family Medicine

## 2018-04-21 VITALS — BP 125/77 | HR 86 | Temp 98.7°F | Resp 16 | Ht 70.0 in | Wt 260.0 lb

## 2018-04-21 DIAGNOSIS — J0101 Acute recurrent maxillary sinusitis: Secondary | ICD-10-CM | POA: Diagnosis not present

## 2018-04-21 MED ORDER — LEVOFLOXACIN 500 MG PO TABS: 500.0000 mg | ORAL_TABLET | Freq: Every day | ORAL | 0 refills | Status: DC

## 2018-04-21 NOTE — Progress Notes (Signed)
Subjective:    Patient ID: Ashley Hale, female    DOB: 05/21/1961, 57 y.o.   MRN: [id number]  Grady Lucci is a 57 y.o. female presenting on 04/21/2018 for Laryngitis (cough onset month never improved much can't cpap machine onset month due to cough and Laryngitis onset Tuesday)   HPI   SINUSITIS - Onset about 4 days ago, felt congestion stuck in throat, switched to Mucinex-D and did not provide relief, worsening sinus pain and pressure now - Cannot use CPAP machine when sick because she states air forced causes her to have difficulty breathing at times - Last visit with me for sick visit 02/08/18, had R ear pain and eustachian tube dysfunction treated with Flonase and Amoxicillin - Last visit at Urgent Care most recently 03/20/18 with acute sinusitis again, treated with Augmentin antibiotic and Codeine-Guaifenesin syrup, without any significant improvement, did not seem to get better and definitely did not resolve - Admits sinus pressure and pain, green thick sputum, pain with coughing - Denies fever, chest pain or productive cough wheezing, headache, nausea vomiting  Health Maintenance: Defer Flu vaccine due to current illness  Depression screen [facility] 2/9 04/21/2018 11/28/2017 11/28/2017  Decreased Interest 0 1 0  Down, Depressed, Hopeless 0 2 0  PHQ - 2 Score 0 3 0  Altered sleeping - 3 -  Tired, decreased energy - 2 -  Change in appetite - 1 -  Feeling bad or failure about yourself  - 3 -  Trouble concentrating - 2 -  Moving slowly or fidgety/restless - 1 -  Suicidal thoughts - 0 -  PHQ-9 Score - 15 -  Difficult doing work/chores - Not difficult at all -    Social History   Tobacco Use  . Smoking status: Never Smoker  . Smokeless tobacco: Never Used  Substance Use Topics  . Alcohol use: No    Alcohol/week: 0.0 standard drinks  . Drug use: No    Review of Systems Per HPI unless specifically indicated above     Objective:    BP 125/77   Pulse 86   Temp 98.7 F (37.1  C) (Oral)   Resp 16   Ht 5\' 10"  (1.778 m)   Wt 260 lb (117.9 kg)   SpO2 99%   BMI 37.31 kg/m   Wt Readings from Last 3 Encounters:  04/21/18 260 lb (117.9 kg)  02/08/18 258 lb (117 kg)  11/28/17 247 lb (112 kg)    Physical Exam  Constitutional: She is oriented to person, place, and time. She appears well-developed and well-nourished. No distress.  Mildly sick appearing, comfortable, cooperative  HENT:  Head: Normocephalic and atraumatic.  Mouth/Throat: Oropharynx is clear and moist.  Frontal / maxillary sinuses mild tender. Nares patent with some turbinate edema, without purulence. Bilateral TMs with mild effusion L>R some mild erythema on L TM without purulence.No bulging. Oropharynx clear without erythema, exudates, edema or asymmetry =- note narrow oropharynx posteriorly  Eyes: Conjunctivae are normal. Right eye exhibits no discharge. Left eye exhibits no discharge.  Neck: Normal range of motion. Neck supple. No thyromegaly present.  Cardiovascular: Normal rate, regular rhythm, normal heart sounds and intact distal pulses.  No murmur heard. Pulmonary/Chest: Effort normal and breath sounds normal. No respiratory distress. She has no wheezes. She has no rales.  Good resp effort. Good air movement.  Musculoskeletal: Normal range of motion. She exhibits no edema.  Lymphadenopathy:    She has no cervical adenopathy.  Neurological: She is alert and oriented  to person, place, and time.  Skin: Skin is warm and dry. No rash noted. She is not diaphoretic. No erythema.  Psychiatric: She has a normal mood and affect. Her behavior is normal.  Well groomed, good eye contact, normal speech and thoughts  Nursing note and vitals reviewed.   I have personally reviewed the radiology report from 12/13/17 CT Chest.   CLINICAL DATA:  Follow-up pneumonia in January 2019  EXAM: CT CHEST WITHOUT CONTRAST  TECHNIQUE: Multidetector CT imaging of the chest was performed following the standard  protocol without IV contrast.  COMPARISON:  07/25/2017  FINDINGS: Cardiovascular: Normal heart size.  No pericardial effusion.  Mediastinum/Nodes: Negative for adenopathy or mass  Lungs/Pleura: Resolved pneumonia in the right upper lobe. The highlighted nodules have also largely resolved, compatible with an inflammatory process. No suspicious nodule in this never smoker.The central airways are clear.  Upper Abdomen: Cholelithiasis.  Musculoskeletal: No acute or aggressive finding  IMPRESSION: Resolved pneumonia.  No suspicious nodule.  Cholelithiasis.   Electronically Signed   By: Marnee Spring M.D.   On: 12/13/2017 10:09  Results for orders placed or performed in visit on 11/28/17  POCT HgB A1C  Result Value Ref Range   Hemoglobin A1C 6.0 (A) 5.7      Assessment & Plan:   Problem List Items Addressed This Visit    None    Visit Diagnoses    Acute recurrent maxillary sinusitis    -  Primary   Relevant Medications   levofloxacin (LEVAQUIN) 500 MG tablet   Other Relevant Orders   Ambulatory referral to ENT      Consistent with subacute recurrent sinusitis, likely initially viral URI vs allergic rhinitis component with worsening concern for bacterial infection.  Limited benefit on prior antibiotic courses Amox/Augmentin  Plan: 1. Start Levaquin 500mg  daily x 7 days - trial of different antibiotic to see if better coverage for recurrent sinusitis 2. Continue antihistamine, flonase 3. Supportive care with nasal saline OTC, hydration, mucinex 4. Return criteria reviewed  Referral to [facility] (first available provider) recurrent sinusitis, laryngitis hoarseness, eustachian tube dysfunction, failed multiple courses antibiotics, most recent trial levaquin to see if effective, has not had sinus imaging before, already on Flonase and allergy medication. Requesting 2nd opinion due to recurrence. Prior history PNA that has resolved on repeat CT imaging.      Meds ordered this encounter  Medications  . levofloxacin (LEVAQUIN) 500 MG tablet    Sig: Take 1 tablet (500 mg total) by mouth daily. For 7 days    Dispense:  7 tablet    Refill:  0   Orders Placed This Encounter  Procedures  . Ambulatory referral to ENT    Referral Priority:   Routine    Referral Type:   Consultation    Referral Reason:   Specialty Services Required    Requested Specialty:   Otolaryngology    Number of Visits Requested:   1    Follow up plan: Return in about 2 weeks (around 05/05/2018), or if symptoms worsen or fail to improve, for sinusitis.  Saralyn Pilar, DO [facility] 04/21/2018, 4:40 PM

## 2018-04-21 NOTE — Patient Instructions (Addendum)
Thank you for coming to the office today.  1. It sounds like you have a Sinusitis (Bacterial Infection) - seems to be recurrent, maybe never resolved from previous  Now start stronger antibiotic - next step up with Levaquin 500mg  daily for 7 days - possibly sinus was only partially treated and this may help completely clear it up  - Resume Loratadine (Claritin) 10mg  daily and Flonase 2 sprays in each nostril daily for next 4-6 weeks, then you may stop and use seasonally or as needed - Recommend to keep using Nasal Saline spray multiple times a day to help flush out congestion and clear sinuses - Improve hydration by drinking plenty of clear fluids (water, gatorade) to reduce secretions and thin congestion - Congestion draining down throat can cause irritation. May try warm herbal tea with honey, cough drops - Can take Tylenol or Ibuprofen as needed for fevers - Keep taking mucinex-D for up to 7-10 days  If you develop persistent fever >101F for at least 3 consecutive days, headaches with sinus pain or pressure or persistent earache, please schedule a follow-up evaluation within next few days to week.  Referral to ENT - stay tuned for 2nd opinion from them, may need a CT sinus scan or other treatment.  [facility] [address]  [place], [place] [address] Ph: [phone]  Please schedule a Follow-up Appointment to: Return in about 2 weeks (around 05/05/2018), or if symptoms worsen or fail to improve, for sinusitis.  If you have any other questions or concerns, please feel free to call the office or send a message through MyChart. You may also schedule an earlier appointment if necessary.  Additionally, you may be receiving a survey about your experience at our office within a few days to 1 week by e-mail or mail. We value your feedback.  Saralyn Pilar, DO [facility], [place]

## 2018-04-24 ENCOUNTER — Ambulatory Visit: Payer: BC Managed Care – PPO | Admitting: Family Medicine

## 2018-05-03 ENCOUNTER — Ambulatory Visit (INDEPENDENT_AMBULATORY_CARE_PROVIDER_SITE_OTHER): Payer: BC Managed Care – PPO

## 2018-05-03 DIAGNOSIS — Z23 Encounter for immunization: Secondary | ICD-10-CM | POA: Diagnosis not present

## 2018-05-17 ENCOUNTER — Telehealth: Payer: Self-pay

## 2018-05-17 DIAGNOSIS — N951 Menopausal and female climacteric states: Secondary | ICD-10-CM

## 2018-05-17 MED ORDER — PAROXETINE HCL 20 MG PO TABS: 20.0000 mg | ORAL_TABLET | Freq: Every day | ORAL | 2 refills | Status: DC

## 2018-05-17 NOTE — Telephone Encounter (Signed)
Refilled since she was previously on med before. Follow-up in office in future to discuss other changes.  Saralyn Pilar, DO [facility] 05/17/2018, 1:22 PM

## 2018-05-17 NOTE — Telephone Encounter (Signed)
Patient called and requesting a refill on paroxetine 20 mg.  Patient did report that she stopped the lexapro because it made her hot flashes worse.  Patient did not report this when she changed back.  However, I did tell patient she has to make an appointment.  Patient is requesting 1 refill and she will make an appointment to discuss meds.

## 2018-05-22 ENCOUNTER — Other Ambulatory Visit: Payer: Self-pay | Admitting: Family Medicine

## 2018-05-22 DIAGNOSIS — G2581 Restless legs syndrome: Secondary | ICD-10-CM

## 2018-05-29 ENCOUNTER — Ambulatory Visit: Payer: BC Managed Care – PPO | Admitting: Family Medicine

## 2018-07-04 ENCOUNTER — Ambulatory Visit: Payer: BC Managed Care – PPO | Admitting: Family Medicine

## 2018-07-05 ENCOUNTER — Ambulatory Visit: Payer: BC Managed Care – PPO | Admitting: Family Medicine

## 2018-07-05 ENCOUNTER — Encounter: Payer: Self-pay | Admitting: Family Medicine

## 2018-07-05 VITALS — BP 127/75 | HR 75 | Temp 98.6°F | Resp 16 | Ht 70.0 in | Wt 256.8 lb

## 2018-07-05 DIAGNOSIS — J0100 Acute maxillary sinusitis, unspecified: Secondary | ICD-10-CM

## 2018-07-05 DIAGNOSIS — R7303 Prediabetes: Secondary | ICD-10-CM

## 2018-07-05 LAB — POCT GLYCOSYLATED HEMOGLOBIN (HGB A1C): Hemoglobin A1C: 6.4 % — AB (ref 4.0–5.6)

## 2018-07-05 MED ORDER — METFORMIN HCL ER 500 MG PO TB24: 500.0000 mg | ORAL_TABLET | Freq: Every day | ORAL | 3 refills | Status: DC

## 2018-07-05 MED ORDER — AMOXICILLIN-POT CLAVULANATE 875-125 MG PO TABS: 1.0000 | ORAL_TABLET | Freq: Two times a day (BID) | ORAL | 0 refills | Status: DC

## 2018-07-05 MED ORDER — IPRATROPIUM BROMIDE 0.06 % NA SOLN: 2.0000 | Freq: Four times a day (QID) | NASAL | 0 refills | Status: DC

## 2018-07-05 NOTE — Assessment & Plan Note (Signed)
Clinically consistent with Morbid Obesity with BMI 36 (< 40) due to comorbid conditions with OSA, HLD, GERD Weight gain still abnormal, limited exercise but improved diet, also concern postmenopausal hormonal factor, likely limited wt loss with uncontrolled OSA as well - Prior failed Phentermine med - Improved A1c. Normal TSH  Plan: - Discussed options for more comprehensive wt management - Handout given offer [facility] - [place] Dalbert [place] for more comprehensive approach diet/lifestye/exercise/medication - she may go to informational session if needs referral let us know - Handout given on info for [facility], more aggressive medication approach with Phentermine, Vitamin B12 injection therapy and lab monitoring - Offered Contrave rx - likely higher cost, may try for up to 1-3 months if interested - Remain off GLP1 for now - Continue Metformin - Counseling on encourage lifestyle changes to improve regular exercise and continue diet  Follow-up 4 months wt check / A1c

## 2018-07-05 NOTE — Assessment & Plan Note (Addendum)
Elevated A1c up to 6.4, non adherent to lifestyle, off GLP1 and reduced Metformin Concern with obesity, HTN, HLD, OSA  Plan:  1. RESTART Metformin XR 500mg  daily now - instead of QOD - Remain off Victoza for now - as this was only for wt loss - will try alternative approach 2. Encourage improved lifestyle - diet / exercise - See A&P - will advise she follow-up with more dedicated wt management program 3. Follow-up 4 months PreDM A1c, wt check  Prefer to hold GLP1 - will not know if she becomes diabetic if on this medicine and higher sugar. In future we may reconsider if able to lose wt and improve A1c again w/ lifestyle

## 2018-07-05 NOTE — Patient Instructions (Addendum)
Thank you for coming to the office today.  A1c 6.4  Recent Labs    08/26/17 1615 11/28/17 1638 07/05/18 1130  HGBA1C 6.1* 6.0* 6.4*    Restart Metformin XR 500mg  daily every day now - may try higher dose in future if needed  Likely increased insulin from body trying to compensate is lowering sugar  --------------------------------------------  Consider Contrave for wt loss - has appetite suppression , it is an anti depressant category - Cost is high if not covered, may try a goodrx coupon with some reduced cost, but still high, likely only use for 3 months  ---------------------------------------------------------  [facility] [address], [address] [place], [place] [address] Hours: 8am - 5pm Main: [phone]  Weight Loss Program (BMI >27, obesity w/o co-morbidities) - Will check thyroid studies - B12 injection - Phentermine  --------------------------------------------------------------------------  WEIGHT MANAGEMENT  They offer informational sessions to learn about their services and this program  Dr Quillian Quincearen Beasley  [facility] [address] [place], [place] [address] Ph: [phone]  Re-schedule w/ ENT for sinuses  Start Atrovent nasal spray decongestant 2 sprays in each nostril up to 4 times daily for 7 days  Continue Mucinex  - May try Augmentin antibiotic after 48 hours if not improved, worsening pain, fever,  Or congestion   Please schedule a Follow-up Appointment to: Return in about 4 months (around 11/04/2018) for 4 months PreDM A1c, Wt management.  If you have any other questions or concerns, please feel free to call the office or send a message through MyChart. You may also schedule an earlier appointment if necessary.  Additionally, you may be receiving a survey about your experience at our office within a few days to 1 week by e-mail or mail. We value your feedback.  Saralyn PilarAlexander Valoria Tamburri,  DO [facility]

## 2018-07-05 NOTE — Progress Notes (Signed)
Subjective:    Patient ID: Ashley Hale, female    DOB: June 20, 1961, 57 y.o.   MRN: [id number]  Ashley Hale is a 57 y.o. female presenting on 07/05/2018 for Weight Check (follow up) and Pre-Diabetes   HPI   Pre-Diabetes /Obesity BMI >36 - Last visit with me for same topics approximately 11/2017, treated with continued Metformin XR and increased Liraglutide GLP1 for wt loss, see prior notes for background information. - Interval update with still has issues with losing weight, previous strategy no longer effective - Today patient reports interested in 2nd opinion for wt loss and program. She states she needs help to get lifestyle organized, and needs more supervision with this. She admits often will "cheat" on diet and not follow it. - Recent A1c increased from 6 up to 6.4 now. CBGs:Not checking CBG Meds: Metformin XR 500mg every other day - could not tolerate x 2 pills daily had jittery symptoms of low sugar - Off Victoza in past few months Reports good compliance. Tolerating well w/o side-effects Currentlynot onACEi / ARB Lifestyle: Down a few lbs since last visit History of obesity in past used to take phentermine, did well at that time. She joined [employer] online She has seen bariatric clinic in past but did not agree with their recommendations - Diet (still improving diet) - Exercise (had improved exercise but seems to not be consistent) Denies hypoglycemia, polyuria, visual changes, numbness or tingling.  Additional concern: Acute sinusitis, history of recurrent - previously seen last 04/2018 had recurrent sinusitis, given amoxicillin and then later treated w/ levaquin with eventual resolution. She states sick contacts at home and grandchildren. She had recent onset symptoms now 4-5 days with sinus congestion and pressure. She has used nasal spray previously, tried mucinex without significant relief. She was referred to ENT back in 04/2018 she had to reschedule this  apt, however was sent for this same issue with recurrent sinusitis. - Denies fever, chills, worsening headache   Depression screen [facility] 2/9 07/05/2018 04/21/2018 11/28/2017  Decreased Interest 0 0 1  Down, Depressed, Hopeless 0 0 2  PHQ - 2 Score 0 0 3  Altered sleeping - - 3  Tired, decreased energy - - 2  Change in appetite - - 1  Feeling bad or failure about yourself  - - 3  Trouble concentrating - - 2  Moving slowly or fidgety/restless - - 1  Suicidal thoughts - - 0  PHQ-9 Score - - 15  Difficult doing work/chores - - Not difficult at all    Social History   Tobacco Use  . Smoking status: Never Smoker  . Smokeless tobacco: Never Used  Substance Use Topics  . Alcohol use: No    Alcohol/week: 0.0 standard drinks  . Drug use: No    Review of Systems Per HPI unless specifically indicated above     Objective:    BP 127/75   Pulse 75   Temp 98.6 F (37 C) (Oral)   Resp 16   Ht 5\' 10"  (1.778 m)   Wt 256 lb 12.8 oz (116.5 kg)   BMI 36.85 kg/m   Wt Readings from Last 3 Encounters:  07/05/18 256 lb 12.8 oz (116.5 kg)  04/21/18 260 lb (117.9 kg)  02/08/18 258 lb (117 kg)    Physical Exam Vitals signs and nursing note reviewed.  Constitutional:      General: She is not in acute distress.    Appearance: She is well-developed. She is not diaphoretic.  Comments: Well-appearing, comfortable, cooperative, obese  HENT:     Head: Normocephalic and atraumatic.     Comments: Frontal / maxillary sinuses non-tender. Nares with some deeper turbinate edema without purulence, L>R. Bilateral TMs clear without erythema, effusion or bulging. Oropharynx clear without erythema, exudates, edema or asymmetry. Eyes:     General:        Right eye: No discharge.        Left eye: No discharge.     Conjunctiva/sclera: Conjunctivae normal.  Neck:     Musculoskeletal: Normal range of motion and neck supple.     Thyroid: No thyromegaly.  Cardiovascular:     Rate and Rhythm: Normal rate  and regular rhythm.     Heart sounds: Normal heart sounds. No murmur.  Pulmonary:     Effort: Pulmonary effort is normal. No respiratory distress.     Breath sounds: Normal breath sounds. No wheezing or rales.  Musculoskeletal: Normal range of motion.  Lymphadenopathy:     Cervical: No cervical adenopathy.  Skin:    General: Skin is warm and dry.     Findings: No erythema or rash.  Neurological:     Mental Status: She is alert and oriented to person, place, and time.  Psychiatric:        Behavior: Behavior normal.     Comments: Well groomed, good eye contact, normal speech and thoughts      Recent Labs    08/26/17 1615 11/28/17 1638 07/05/18 1130  HGBA1C 6.1* 6.0* 6.4*    Results for orders placed or performed in visit on 07/05/18  POCT HgB A1C  Result Value Ref Range   Hemoglobin A1C 6.4 (A) 4.0 - 5.6 %      Assessment & Plan:   Problem List Items Addressed This Visit    Morbid obesity (HCC)    Clinically consistent with Morbid Obesity with BMI 36 (< 40) due to comorbid conditions with OSA, HLD, GERD Weight gain still abnormal, limited exercise but improved diet, also concern postmenopausal hormonal factor, likely limited wt loss with uncontrolled OSA as well - Prior failed Phentermine med - Improved A1c. Normal TSH  Plan: - Discussed options for more comprehensive wt management - Handout given offer refer to [facility] - Dr Dalbert Garnet in [place] for more comprehensive approach diet/lifestye/exercise/medication - she may go to informational session if needs referral let us know - Handout given on info for [facility] wt management clinic, more aggressive medication approach with Phentermine, Vitamin B12 injection therapy and lab monitoring - Offered Contrave rx - likely higher cost, may try for up to 1-3 months if interested - Remain off GLP1 for now - Continue Metformin - Counseling on encourage lifestyle changes to improve regular exercise and  continue diet  Follow-up 4 months wt check / A1c      Relevant Medications   metFORMIN (GLUCOPHAGE-XR) 500 MG 24 hr tablet   Prediabetes - Primary    Elevated A1c up to 6.4, non adherent to lifestyle, off GLP1 and reduced Metformin Concern with obesity, HTN, HLD, OSA  Plan:  1. RESTART Metformin XR 500mg  daily now - instead of QOD - Remain off Victoza for now - as this was only for wt loss - will try alternative approach 2. Encourage improved lifestyle - diet / exercise - See A&P - will advise she follow-up with more dedicated wt management program 3. Follow-up 4 months PreDM A1c, wt check  Prefer to hold GLP1 - will not know if  she becomes diabetic if on this medicine and higher sugar. In future we may reconsider if able to lose wt and improve A1c again w/ lifestyle      Relevant Medications   metFORMIN (GLUCOPHAGE-XR) 500 MG 24 hr tablet   Other Relevant Orders   POCT HgB A1C (Completed)    Other Visit Diagnoses    Acute non-recurrent maxillary sinusitis       Relevant Medications   amoxicillin-clavulanate (AUGMENTIN) 875-125 MG tablet   ipratropium (ATROVENT) 0.06 % nasal spray      Consistent with acute maxillary sinusitis, likely initially viral URI vs allergic rhinitis component with chronic recurrent sinusitis, today without acute concern for bacterial infection. - Prior recurrent sinusitis back in 04/2018 required repeat antibiotics  Plan: 1. Reassurance, likely self-limited - no indication for antibiotics at this time - First she should re-schedule w/  ENT for 2nd opinion, referred for same reason back in 04/2018 - Start Atrovent nasal spray decongestant 2 sprays in each nostril up to 4 times daily for 7 days - Mucinex regular w/o additive - Printed back up rx for Augmentin 875-125mg  PO BID x 10 days - only if worsening in >48 hours consistent with sinus infection Supportive care with nasal saline OTC, hydration Return criteria reviewed   Meds ordered  this encounter  Medications  . metFORMIN (GLUCOPHAGE-XR) 500 MG 24 hr tablet    Sig: Take 1 tablet (500 mg total) by mouth daily with breakfast.    Dispense:  90 tablet    Refill:  3    Dose change  . amoxicillin-clavulanate (AUGMENTIN) 875-125 MG tablet    Sig: Take 1 tablet by mouth 2 (two) times daily. For 10 days    Dispense:  20 tablet    Refill:  0  . ipratropium (ATROVENT) 0.06 % nasal spray    Sig: Place 2 sprays into both nostrils 4 (four) times daily. For up to 5-7 days then stop.    Dispense:  15 mL    Refill:  0    Follow up plan: Return in about 4 months (around 11/04/2018) for 4 months PreDM A1c, Wt management.  Saralyn PilarAlexander Ayline Dingus, DO [facility] 07/05/2018, 11:13 AM

## 2018-07-18 ENCOUNTER — Other Ambulatory Visit: Payer: Self-pay | Admitting: Family Medicine

## 2018-07-18 DIAGNOSIS — N951 Menopausal and female climacteric states: Secondary | ICD-10-CM

## 2018-07-28 ENCOUNTER — Ambulatory Visit (INDEPENDENT_AMBULATORY_CARE_PROVIDER_SITE_OTHER): Payer: BC Managed Care – PPO

## 2018-07-28 DIAGNOSIS — Z23 Encounter for immunization: Secondary | ICD-10-CM

## 2018-08-17 ENCOUNTER — Telehealth: Payer: Self-pay | Admitting: Family Medicine

## 2018-08-17 DIAGNOSIS — K219 Gastro-esophageal reflux disease without esophagitis: Secondary | ICD-10-CM

## 2018-08-17 NOTE — Telephone Encounter (Signed)
Pt needs refill onfomadine.

## 2018-08-18 ENCOUNTER — Other Ambulatory Visit: Payer: Self-pay

## 2018-08-18 DIAGNOSIS — K219 Gastro-esophageal reflux disease without esophagitis: Secondary | ICD-10-CM

## 2018-08-18 MED ORDER — FAMOTIDINE 40 MG PO TABS: 40.0000 mg | ORAL_TABLET | Freq: Two times a day (BID) | ORAL | 5 refills | Status: DC

## 2018-10-09 ENCOUNTER — Other Ambulatory Visit: Payer: Self-pay | Admitting: Family Medicine

## 2018-10-09 DIAGNOSIS — N951 Menopausal and female climacteric states: Secondary | ICD-10-CM

## 2018-11-02 ENCOUNTER — Other Ambulatory Visit: Payer: Self-pay | Admitting: Family Medicine

## 2018-11-02 DIAGNOSIS — E782 Mixed hyperlipidemia: Secondary | ICD-10-CM

## 2018-11-02 DIAGNOSIS — G4733 Obstructive sleep apnea (adult) (pediatric): Secondary | ICD-10-CM

## 2018-11-02 DIAGNOSIS — E559 Vitamin D deficiency, unspecified: Secondary | ICD-10-CM

## 2018-11-02 DIAGNOSIS — R7303 Prediabetes: Secondary | ICD-10-CM

## 2018-11-03 ENCOUNTER — Other Ambulatory Visit: Payer: BC Managed Care – PPO

## 2018-11-03 ENCOUNTER — Other Ambulatory Visit: Payer: Self-pay

## 2018-11-03 DIAGNOSIS — G4733 Obstructive sleep apnea (adult) (pediatric): Secondary | ICD-10-CM

## 2018-11-03 DIAGNOSIS — R7303 Prediabetes: Secondary | ICD-10-CM

## 2018-11-03 DIAGNOSIS — E782 Mixed hyperlipidemia: Secondary | ICD-10-CM

## 2018-11-03 DIAGNOSIS — E559 Vitamin D deficiency, unspecified: Secondary | ICD-10-CM

## 2018-11-04 LAB — COMPLETE METABOLIC PANEL WITH GFR
AG Ratio: 1.5 (calc) (ref 1.0–2.5)
ALT: 18 U/L (ref 6–29)
AST: 16 U/L (ref 10–35)
Albumin: 4.1 g/dL (ref 3.6–5.1)
Alkaline phosphatase (APISO): 83 U/L (ref 37–153)
BUN: 15 mg/dL (ref 7–25)
CO2: 28 mmol/L (ref 20–32)
Calcium: 9.2 mg/dL (ref 8.6–10.4)
Chloride: 106 mmol/L (ref 98–110)
Creat: 1.04 mg/dL (ref 0.50–1.05)
GFR, Est African American: 68 mL/min/{1.73_m2} (ref >=60)
GFR, Est Non African American: 59 mL/min/{1.73_m2} — ABNORMAL LOW (ref >=60)
Globulin: 2.7 g/dL (calc) (ref 1.9–3.7)
Glucose, Bld: 112 mg/dL — ABNORMAL HIGH (ref 65–99)
Potassium: 4.4 mmol/L (ref 3.5–5.3)
Sodium: 141 mmol/L (ref 135–146)
Total Bilirubin: 0.5 mg/dL (ref 0.2–1.2)
Total Protein: 6.8 g/dL (ref 6.1–8.1)

## 2018-11-04 LAB — CBC WITH DIFFERENTIAL/PLATELET
Absolute Monocytes: 578 cells/uL (ref 200–950)
Basophils Absolute: 42 cells/uL (ref 0–200)
Basophils Relative: 0.8 %
Eosinophils Absolute: 191 cells/uL (ref 15–500)
Eosinophils Relative: 3.6 %
HCT: 38.7 % (ref 35.0–45.0)
Hemoglobin: 13.1 g/dL (ref 11.7–15.5)
Lymphs Abs: 1622 cells/uL (ref 850–3900)
MCH: 29.6 pg (ref 27.0–33.0)
MCHC: 33.9 g/dL (ref 32.0–36.0)
MCV: 87.6 fL (ref 80.0–100.0)
MPV: 9.8 fL (ref 7.5–12.5)
Monocytes Relative: 10.9 %
Neutro Abs: 2867 cells/uL (ref 1500–7800)
Neutrophils Relative %: 54.1 %
Platelets: 280 10*3/uL (ref 140–400)
RBC: 4.42 10*6/uL (ref 3.80–5.10)
RDW: 13.1 % (ref 11.0–15.0)
Total Lymphocyte: 30.6 %
WBC: 5.3 10*3/uL (ref 3.8–10.8)

## 2018-11-04 LAB — HEMOGLOBIN A1C
Hgb A1c MFr Bld: 6.1 % of total Hgb — ABNORMAL HIGH (ref <5.7)
Mean Plasma Glucose: 128 (calc)
eAG (mmol/L): 7.1 (calc)

## 2018-11-04 LAB — LIPID PANEL
Cholesterol: 219 mg/dL — ABNORMAL HIGH (ref <200)
HDL: 52 mg/dL (ref >=50)
LDL Cholesterol (Calc): 142 mg/dL (calc) — ABNORMAL HIGH
Non-HDL Cholesterol (Calc): 167 mg/dL (calc) — ABNORMAL HIGH (ref <130)
Total CHOL/HDL Ratio: 4.2 (calc) (ref <5.0)
Triglycerides: 125 mg/dL (ref <150)

## 2018-11-04 LAB — T4, FREE: Free T4: 1.1 ng/dL (ref 0.8–1.8)

## 2018-11-04 LAB — TSH: TSH: 1.84 mIU/L (ref 0.40–4.50)

## 2018-11-04 LAB — VITAMIN D 25 HYDROXY (VIT D DEFICIENCY, FRACTURES): Vit D, 25-Hydroxy: 17 ng/mL — ABNORMAL LOW (ref 30–100)

## 2018-11-06 ENCOUNTER — Other Ambulatory Visit: Payer: Self-pay

## 2018-11-06 ENCOUNTER — Encounter: Payer: Self-pay | Admitting: Family Medicine

## 2018-11-06 ENCOUNTER — Ambulatory Visit (INDEPENDENT_AMBULATORY_CARE_PROVIDER_SITE_OTHER): Payer: BC Managed Care – PPO | Admitting: Family Medicine

## 2018-11-06 DIAGNOSIS — E782 Mixed hyperlipidemia: Secondary | ICD-10-CM | POA: Diagnosis not present

## 2018-11-06 DIAGNOSIS — R7303 Prediabetes: Secondary | ICD-10-CM

## 2018-11-06 DIAGNOSIS — E559 Vitamin D deficiency, unspecified: Secondary | ICD-10-CM

## 2018-11-06 DIAGNOSIS — N951 Menopausal and female climacteric states: Secondary | ICD-10-CM

## 2018-11-06 MED ORDER — LIRAGLUTIDE 18 MG/3ML ~~LOC~~ SOPN: 1.8000 mg | PEN_INJECTOR | Freq: Every day | SUBCUTANEOUS | 2 refills | Status: DC

## 2018-11-06 NOTE — Progress Notes (Signed)
Subjective:    Patient ID: Ashley Hale, female    DOB: 12-26-1960, 58 y.o.   MRN: [id number]  Ashley Hale is a 58 y.o. female presenting on 11/06/2018 for Pre-Diabetes  Virtual / Telehealth Encounter - Video Visit via [email or web address] The purpose of this virtual visit is to provide medical care while limiting exposure to the novel coronavirus (COVID19) for both patient and office staff.  Consent was obtained for remote visit:  Yes.   Answered questions that patient had about telehealth interaction:  Yes.   I discussed the limitations, risks, security and privacy concerns of performing an evaluation and management service by video/telephone. I also discussed with the patient that there may be a patient responsible charge related to this service. The patient expressed understanding and agreed to proceed.  Patient Location: Home Provider Location: Ashley Hale (Office)   HPI   Pre-Diabetes Ashley Hale Obesity BMI - Last visit with me for same topics approximately 06/2018, treated with continued Metformin XR and Liraglutide GLP1 for wt loss, see prior notes for background information. - Interval update with some improvement on med and lifestyle then recent worse in past 1 month due to coronavirus restrictions home quarantine, increased appetite - Today patient is doing well overall, had recent labs. She feels that she was on track with managing her PreDm, now seems some difficulty in managing it in past 1 month at home in isolation - Last lab results, Improved A1c down to 6.1, from last time 6.4 CBGs:Not checking CBG Meds: Metformin XR every day, on Victoza 1.8mg  daily injection - tolerating well Reports good compliance. Tolerating well w/o side-effects Currentlynot onACEi / ARB Lifestyle: - Weight down 17 lbs since last visit doing well, down to 243, then recently gained some lbs back since coronavirus quarantine recommendations History of obesity in past used to take  phentermine, did well at that time. She joined [employer] online - Diet (still improving diet) - Exercise (had improved exercise but seems to not be consistent) Denies hypoglycemia, polyuria, visual changes, numbness or tingling.  Low Vitamin D, deficiency Last lab Vitamin D 17, previously 18. >1 year since last treatment.  HYPERLIPIDEMIA: Last lipid panel 10/2018, elevated LDL, otherwise controlled HDL and TG Not currently on any cholesterol medication   Depression screen [facility] 2/9 11/06/2018 07/05/2018 04/21/2018  Decreased Interest 0 0 0  Down, Depressed, Hopeless 0 0 0  PHQ - 2 Score 0 0 0  Altered sleeping - - -  Tired, decreased energy - - -  Change in appetite - - -  Feeling bad or failure about yourself  - - -  Trouble concentrating - - -  Moving slowly or fidgety/restless - - -  Suicidal thoughts - - -  PHQ-9 Score - - -  Difficult doing work/chores - - -    Social History   Tobacco Use  . Smoking status: Never Smoker  . Smokeless tobacco: Never Used  Substance Use Topics  . Alcohol use: No    Alcohol/week: 0.0 standard drinks  . Drug use: No    Review of Systems Per HPI unless specifically indicated above     Objective:    There were no vitals taken for this visit.  Wt Readings from Last 3 Encounters:  07/05/18 256 lb 12.8 oz (116.5 kg)  04/21/18 260 lb (117.9 kg)  02/08/18 258 lb (117 kg)    Physical Exam   Note examination was completely remotely via video observation objective data only  Gen -  well-appearing, no acute distress or apparent pain, comfortable HEENT - eyes appear clear without discharge or redness Heart/Lungs - cannot examine virtually - observed no evidence of coughing or labored breathing. Abd - cannot examine virtually  Skin - face visible today- no rash Neuro - awake, alert, oriented Psych - not anxious appearing   Results for orders placed or performed in visit on 11/03/18  VITAMIN D 25 Hydroxy (Vit-D Deficiency,  Fractures)  Result Value Ref Range   Vit D, 25-Hydroxy 17 (L) 30 - 100 ng/mL  Lipid panel  Result Value Ref Range   Cholesterol 219 (H) <200 mg/dL   HDL 52 > OR = 50 mg/dL   Triglycerides 161125 <096<150 mg/dL   LDL Cholesterol (Calc) 142 (H) mg/dL (calc)   Total CHOL/HDL Ratio 4.2 <5.0 (calc)   Non-HDL Cholesterol (Calc) 167 (H) <130 mg/dL (calc)  COMPLETE METABOLIC PANEL WITH GFR  Result Value Ref Range   Glucose, Bld 112 (H) 65 - 99 mg/dL   BUN 15 7 - 25 mg/dL   Creat 0.451.04 4.090.50 - 8.111.05 mg/dL   GFR, Est Non African American 59 (L) > OR = 60 mL/min/1.2673m2   GFR, Est African American 68 > OR = 60 mL/min/1.3073m2   BUN/Creatinine Ratio NOT APPLICABLE 6 - 22 (calc)   Sodium 141 135 - 146 mmol/L   Potassium 4.4 3.5 - 5.3 mmol/L   Chloride 106 98 - 110 mmol/L   CO2 28 20 - 32 mmol/L   Calcium 9.2 8.6 - 10.4 mg/dL   Total Protein 6.8 6.1 - 8.1 g/dL   Albumin 4.1 3.6 - 5.1 g/dL   Globulin 2.7 1.9 - 3.7 g/dL (calc)   AG Ratio 1.5 1.0 - 2.5 (calc)   Total Bilirubin 0.5 0.2 - 1.2 mg/dL   Alkaline phosphatase (APISO) 83 37 - 153 U/L   AST 16 10 - 35 U/L   ALT 18 6 - 29 U/L  CBC with Differential/Platelet  Result Value Ref Range   WBC 5.3 3.8 - 10.8 Thousand/uL   RBC 4.42 3.80 - 5.10 Million/uL   Hemoglobin 13.1 11.7 - 15.5 g/dL   HCT 91.438.7 78.235.0 - 95.645.0 %   MCV 87.6 80.0 - 100.0 fL   MCH 29.6 27.0 - 33.0 pg   MCHC 33.9 32.0 - 36.0 g/dL   RDW 21.313.1 08.611.0 - 57.815.0 %   Platelets 280 140 - 400 Thousand/uL   MPV 9.8 7.5 - 12.5 fL   Neutro Abs 2,867 1,500 - 7,800 cells/uL   Lymphs Abs 1,622 850 - 3,900 cells/uL   Absolute Monocytes 578 200 - 950 cells/uL   Eosinophils Absolute 191 15 - 500 cells/uL   Basophils Absolute 42 0 - 200 cells/uL   Neutrophils Relative % 54.1 %   Total Lymphocyte 30.6 %   Monocytes Relative 10.9 %   Eosinophils Relative 3.6 %   Basophils Relative 0.8 %  Hemoglobin A1c  Result Value Ref Range   Hgb A1c MFr Bld 6.1 (H) <5.7 % of total Hgb   Mean Plasma Glucose 128  (calc)   eAG (mmol/L) 7.1 (calc)  TSH  Result Value Ref Range   TSH 1.84 0.40 - 4.50 mIU/L  T4, free  Result Value Ref Range   Free T4 1.1 0.8 - 1.8 ng/dL      Assessment & Plan:   Problem List Items Addressed This Visit    Hot flash, menopausal   Hyperlipidemia    Elevated LDL, otherwise controlled Last lipid panel 10/2018 Calculated  ASCVD 10 yr risk score approx 3%  Plan: 1. Discussed ASCVD risk - defer statin for now 2. Encourage improved lifestyle - low carb/cholesterol, reduce portion size, continue improving regular exercise 3. Follow-up lipids yearly      Morbid obesity (HCC)    Clinically consistent with Morbid Obesity with BMI >36 (< 40) due to comorbid conditions with OSA, HLD, GERD Some weight loss with lifestyle intervention and medicine - Prior failed Phentermine med - Improved A1c. Normal TSH  Plan: On GLP1, metformin - Counseling on encourage lifestyle changes to improve regular exercise and continue diet  Follow-up 6 months wt check / A1c      Relevant Medications   liraglutide (VICTOZA) 18 MG/3ML SOPN   Prediabetes - Primary    Improved A1c down to 6.1 previously 6.4 On GLP1 and Metformin, improved lifestyle with wt loss Concern with obesity, HTN, HLD, OSA  Plan:  1. Continue Metformin XR 500mg  daily, Victoza 1.8mg  weekly 2. Encourage improved lifestyle - diet / exercise - resume during home isolation if possible 3. Follow-up 6 months for DM A1c      Relevant Medications   liraglutide (VICTOZA) 18 MG/3ML SOPN   Vitamin D deficiency    Low Vit D 17, had improved in past No longer on supplement Start OTC Vitamin D3 5,000 iu daily for 12 weeks then reduce to OTC Vitamin D3 2,000 iu daily maintenance         Meds ordered this encounter  Medications  . liraglutide (VICTOZA) 18 MG/3ML SOPN    Sig: Inject 0.3 mLs (1.8 mg total) into the skin daily.    Dispense:  9 mL    Refill:  2    Dose change    Follow-up: - Return in 6 months for  Pre-DM A1c  Patient verbalizes understanding with the above medical recommendations including the limitation of remote medical advice.  Specific follow-up and call-back criteria were given for patient to follow-up or seek medical care more urgently if needed.  Total duration of direct patient care provided via video conference: 15 minutes  Saralyn Pilar, DO [facility] 11/06/2018, 8:25 AM

## 2018-11-06 NOTE — Patient Instructions (Addendum)
Thank you for coming to the office today.  Recent Labs    11/28/17 1638 07/05/18 1130 11/03/18 0837  HGBA1C 6.0* 6.4* 6.1*   Keep up the good work. Refilled Victoza. I am hopeful that your blood sugar and weight will continue to improve on this track.  Please schedule a Follow-up Appointment to: Return in about 6 months (around 05/08/2019) for 6 month follow-up Pre-Diabetes A1c.  If you have any other questions or concerns, please feel free to call the office or send a message through MyChart. You may also schedule an earlier appointment if necessary.  Additionally, you may be receiving a survey about your experience at our office within a few days to 1 week by e-mail or mail. We value your feedback.  Saralyn Pilar, DO [facility], [place]

## 2018-11-06 NOTE — Assessment & Plan Note (Signed)
Low Vit D 17, had improved in past No longer on supplement Start OTC Vitamin D3 5,000 iu daily for 12 weeks then reduce to OTC Vitamin D3 2,000 iu daily maintenance

## 2018-11-06 NOTE — Assessment & Plan Note (Signed)
Clinically consistent with Morbid Obesity with BMI >36 (< 40) due to comorbid conditions with OSA, HLD, GERD Some weight loss with lifestyle intervention and medicine - Prior failed Phentermine med - Improved A1c. Normal TSH  Plan: On GLP1, metformin - Counseling on encourage lifestyle changes to improve regular exercise and continue diet  Follow-up 6 months wt check / A1c

## 2018-11-06 NOTE — Assessment & Plan Note (Signed)
Improved A1c down to 6.1 previously 6.4 On GLP1 and Metformin, improved lifestyle with wt loss Concern with obesity, HTN, HLD, OSA  Plan:  1. Continue Metformin XR 500mg  daily, Victoza 1.8mg  weekly 2. Encourage improved lifestyle - diet / exercise - resume during home isolation if possible 3. Follow-up 6 months for DM A1c

## 2018-11-06 NOTE — Assessment & Plan Note (Signed)
Elevated LDL, otherwise controlled Last lipid panel 10/2018 Calculated ASCVD 10 yr risk score approx 3%  Plan: 1. Discussed ASCVD risk - defer statin for now 2. Encourage improved lifestyle - low carb/cholesterol, reduce portion size, continue improving regular exercise 3. Follow-up lipids yearly

## 2018-12-15 ENCOUNTER — Telehealth: Payer: Self-pay | Admitting: Family Medicine

## 2018-12-15 DIAGNOSIS — K219 Gastro-esophageal reflux disease without esophagitis: Secondary | ICD-10-CM

## 2018-12-15 NOTE — Telephone Encounter (Signed)
[facility] called said that pepcid was on back order, needed something called in to replace.

## 2018-12-15 NOTE — Telephone Encounter (Signed)
Called patient, she used to be on Omeprazole in past, was on Pepcid only most recently now back order due to H2 blocker recall.  Did not reach her, left a voicemail - asking if she is okay going back to Omeprazole 20mg  daily - if she prefers different option (Nexium?) or 40mg  dosing let me know. - we can send a rx when requested to [facility]  Saralyn Pilar, DO [facility] 12/15/2018, 12:09 PM

## 2019-01-17 ENCOUNTER — Other Ambulatory Visit: Payer: Self-pay | Admitting: Family Medicine

## 2019-01-17 DIAGNOSIS — G2581 Restless legs syndrome: Secondary | ICD-10-CM

## 2019-04-03 ENCOUNTER — Other Ambulatory Visit: Payer: Self-pay

## 2019-04-03 ENCOUNTER — Ambulatory Visit (INDEPENDENT_AMBULATORY_CARE_PROVIDER_SITE_OTHER): Payer: BC Managed Care – PPO

## 2019-04-03 DIAGNOSIS — Z23 Encounter for immunization: Secondary | ICD-10-CM | POA: Diagnosis not present

## 2019-04-03 NOTE — Progress Notes (Signed)
Patient had flu shot.

## 2019-04-17 ENCOUNTER — Other Ambulatory Visit: Payer: Self-pay | Admitting: Family Medicine

## 2019-04-17 DIAGNOSIS — N951 Menopausal and female climacteric states: Secondary | ICD-10-CM

## 2019-05-16 ENCOUNTER — Telehealth: Payer: Self-pay

## 2019-05-16 NOTE — Telephone Encounter (Signed)
Patient needed a refill but it has already been sent.

## 2019-06-07 ENCOUNTER — Other Ambulatory Visit: Payer: Self-pay

## 2019-06-07 DIAGNOSIS — Z20822 Contact with and (suspected) exposure to covid-19: Secondary | ICD-10-CM

## 2019-06-09 LAB — NOVEL CORONAVIRUS, NAA: SARS-CoV-2, NAA: DETECTED — AB

## 2019-08-16 ENCOUNTER — Other Ambulatory Visit: Payer: Self-pay | Admitting: Family Medicine

## 2019-08-16 DIAGNOSIS — N951 Menopausal and female climacteric states: Secondary | ICD-10-CM

## 2019-08-16 DIAGNOSIS — R7303 Prediabetes: Secondary | ICD-10-CM

## 2019-08-30 ENCOUNTER — Other Ambulatory Visit: Payer: BC Managed Care – PPO

## 2019-08-30 ENCOUNTER — Telehealth: Payer: Self-pay

## 2019-08-30 ENCOUNTER — Other Ambulatory Visit: Payer: Self-pay

## 2019-08-30 DIAGNOSIS — E559 Vitamin D deficiency, unspecified: Secondary | ICD-10-CM

## 2019-08-30 DIAGNOSIS — R7303 Prediabetes: Secondary | ICD-10-CM

## 2019-08-30 DIAGNOSIS — Z0000 Encounter for general adult medical examination without abnormal findings: Secondary | ICD-10-CM

## 2019-08-30 DIAGNOSIS — E782 Mixed hyperlipidemia: Secondary | ICD-10-CM

## 2019-08-30 NOTE — Telephone Encounter (Signed)
Open in error

## 2019-08-31 ENCOUNTER — Other Ambulatory Visit: Payer: Self-pay

## 2019-08-31 LAB — CBC WITH DIFFERENTIAL/PLATELET
Absolute Monocytes: 530 cells/uL (ref 200–950)
Basophils Absolute: 50 cells/uL (ref 0–200)
Basophils Relative: 1 %
Eosinophils Absolute: 180 cells/uL (ref 15–500)
Eosinophils Relative: 3.6 %
HCT: 39.8 % (ref 35.0–45.0)
Hemoglobin: 13.4 g/dL (ref 11.7–15.5)
Lymphs Abs: 1625 cells/uL (ref 850–3900)
MCH: 29.4 pg (ref 27.0–33.0)
MCHC: 33.7 g/dL (ref 32.0–36.0)
MCV: 87.3 fL (ref 80.0–100.0)
MPV: 10.3 fL (ref 7.5–12.5)
Monocytes Relative: 10.6 %
Neutro Abs: 2615 cells/uL (ref 1500–7800)
Neutrophils Relative %: 52.3 %
Platelets: 273 10*3/uL (ref 140–400)
RBC: 4.56 10*6/uL (ref 3.80–5.10)
RDW: 13.7 % (ref 11.0–15.0)
Total Lymphocyte: 32.5 %
WBC: 5 10*3/uL (ref 3.8–10.8)

## 2019-08-31 LAB — COMPREHENSIVE METABOLIC PANEL
AG Ratio: 1.6 (calc) (ref 1.0–2.5)
ALT: 20 U/L (ref 6–29)
AST: 16 U/L (ref 10–35)
Albumin: 4.1 g/dL (ref 3.6–5.1)
Alkaline phosphatase (APISO): 87 U/L (ref 37–153)
BUN: 14 mg/dL (ref 7–25)
CO2: 29 mmol/L (ref 20–32)
Calcium: 9 mg/dL (ref 8.6–10.4)
Chloride: 105 mmol/L (ref 98–110)
Creat: 0.98 mg/dL (ref 0.50–0.99)
Globulin: 2.5 g/dL (calc) (ref 1.9–3.7)
Glucose, Bld: 100 mg/dL — ABNORMAL HIGH (ref 65–99)
Potassium: 4.4 mmol/L (ref 3.5–5.3)
Sodium: 141 mmol/L (ref 135–146)
Total Bilirubin: 0.4 mg/dL (ref 0.2–1.2)
Total Protein: 6.6 g/dL (ref 6.1–8.1)

## 2019-08-31 LAB — LIPID PANEL
Cholesterol: 244 mg/dL — ABNORMAL HIGH (ref <200)
HDL: 52 mg/dL (ref >=50)
LDL Cholesterol (Calc): 165 mg/dL (calc) — ABNORMAL HIGH
Non-HDL Cholesterol (Calc): 192 mg/dL (calc) — ABNORMAL HIGH (ref <130)
Total CHOL/HDL Ratio: 4.7 (calc) (ref <5.0)
Triglycerides: 135 mg/dL (ref <150)

## 2019-08-31 LAB — VITAMIN D 25 HYDROXY (VIT D DEFICIENCY, FRACTURES): Vit D, 25-Hydroxy: 26 ng/mL — ABNORMAL LOW (ref 30–100)

## 2019-08-31 LAB — HEMOGLOBIN A1C
Hgb A1c MFr Bld: 6.4 % of total Hgb — ABNORMAL HIGH (ref <5.7)
Mean Plasma Glucose: 137 (calc)
eAG (mmol/L): 7.6 (calc)

## 2019-08-31 LAB — TSH: TSH: 1.63 mIU/L (ref 0.40–4.50)

## 2019-09-03 ENCOUNTER — Other Ambulatory Visit: Payer: Self-pay

## 2019-09-03 ENCOUNTER — Encounter: Payer: Self-pay | Admitting: Family Medicine

## 2019-09-03 ENCOUNTER — Ambulatory Visit (INDEPENDENT_AMBULATORY_CARE_PROVIDER_SITE_OTHER): Payer: BC Managed Care – PPO | Admitting: Family Medicine

## 2019-09-03 VITALS — Ht 70.0 in | Wt 254.0 lb

## 2019-09-03 DIAGNOSIS — G2581 Restless legs syndrome: Secondary | ICD-10-CM

## 2019-09-03 DIAGNOSIS — R7303 Prediabetes: Secondary | ICD-10-CM | POA: Diagnosis not present

## 2019-09-03 DIAGNOSIS — Z0000 Encounter for general adult medical examination without abnormal findings: Secondary | ICD-10-CM | POA: Diagnosis not present

## 2019-09-03 DIAGNOSIS — E782 Mixed hyperlipidemia: Secondary | ICD-10-CM

## 2019-09-03 DIAGNOSIS — G4733 Obstructive sleep apnea (adult) (pediatric): Secondary | ICD-10-CM

## 2019-09-03 DIAGNOSIS — K219 Gastro-esophageal reflux disease without esophagitis: Secondary | ICD-10-CM

## 2019-09-03 DIAGNOSIS — Z1231 Encounter for screening mammogram for malignant neoplasm of breast: Secondary | ICD-10-CM

## 2019-09-03 DIAGNOSIS — R609 Edema, unspecified: Secondary | ICD-10-CM

## 2019-09-03 MED ORDER — OMEPRAZOLE 20 MG PO CPDR: 20.0000 mg | DELAYED_RELEASE_CAPSULE | Freq: Two times a day (BID) | ORAL | 1 refills | Status: DC

## 2019-09-03 MED ORDER — FUROSEMIDE 20 MG PO TABS: 20.0000 mg | ORAL_TABLET | Freq: Every day | ORAL | 0 refills | Status: DC

## 2019-09-03 MED ORDER — ROPINIROLE HCL 0.25 MG PO TABS: ORAL_TABLET | ORAL | 1 refills | Status: DC

## 2019-09-03 NOTE — Progress Notes (Signed)
Subjective:    Patient ID: Ashley Hale, female    DOB: March 04, 1961, 59 y.o.   MRN: [id number]  Alanna Storti is a 59 y.o. female presenting on 09/03/2019 for Annual Exam  Virtual / Telehealth Encounter - Telephone  The purpose of this virtual visit is to provide medical care while limiting exposure to the novel coronavirus (COVID19) for both patient and office staff.  Consent was obtained for remote visit:  Yes.   Answered questions that patient had about telehealth interaction:  Yes.   I discussed the limitations, risks, security and privacy concerns of performing an evaluation and management service by video/telephone. I also discussed with the patient that there may be a patient responsible charge related to this service. The patient expressed understanding and agreed to proceed.  Patient Location: Home Provider Location: [facility] (Office)   HPI   Here for Annual Physical and Lab Review.  Pre-Diabetes /Morbid Obesity BMI see prior notes for background information. Admits more sedentary while on covid19 precautions. Not adhering to diet as much CBGs:Not checking CBG Meds: Metformin XR500mg every day, on Victoza 1.8mg  daily injection - tolerating well Reports good compliance. Tolerating well w/o side-effects Currentlynot onACEi / ARB Lifestyle: - Weight has maintained or increased, without significant improvement since last visit - Diet (still improving diet but now admits drinking regular soda, prefers to avoid diet drink) - Exercise (had improved exercisebut seems to not be consistent) Denies hypoglycemia, polyuria, visual changes, numbness or tingling.  Low Vitamin D, deficiency Improved Vitamin D now lab >26, on supplement.  HYPERLIPIDEMIA: Last lipid panel 08/2019, elevated LDL up to 160 now, otherwise controlled HDL and TG Not currently on any cholesterol medication  GERD Evening worse with abdominal pain indigestion and inc gas production  burping, worse in PM. Was on famotidine then unavailable, now tried Omeprazole with better results, took in PM as well and would like to continue.  OSA on CPAP  Health Maintenance: No longer indicated for pap smears due to s/p hysterectomy and cervix removal Due for mammogram last 2018, request order, she will schedule  Depression screen [facility] 2/9 11/06/2018 07/05/2018 04/21/2018  Decreased Interest 0 0 0  Down, Depressed, Hopeless 0 0 0  PHQ - 2 Score 0 0 0  Altered sleeping - - -  Tired, decreased energy - - -  Change in appetite - - -  Feeling bad or failure about yourself  - - -  Trouble concentrating - - -  Moving slowly or fidgety/restless - - -  Suicidal thoughts - - -  PHQ-9 Score - - -  Difficult doing work/chores - - -    Past Medical History:  Diagnosis Date  . GERD (gastroesophageal reflux disease)   . IBS (irritable bowel syndrome)   . Obesity    Past Surgical History:  Procedure Laterality Date  . ABDOMINAL HYSTERECTOMY    . BACK SURGERY  1992   Lumbar spine, herniated disc   Social History   Socioeconomic History  . Marital status: Married    Spouse name: Not on file  . Number of children: Not on file  . Years of education: Not on file  . Highest education level: Not on file  Occupational History  . Not on file  Tobacco Use  . Smoking status: Never Smoker  . Smokeless tobacco: Never Used  Substance and Sexual Activity  . Alcohol use: No    Alcohol/week: 0.0 standard drinks  . Drug use: No  . Sexual activity:  Not on file  Other Topics Concern  . Not on file  Social History Narrative  . Not on file   Social Determinants of Health   Financial Resource Strain:   . Difficulty of Paying Living Expenses: Not on file  Food Insecurity:   . Worried About Charity fundraiser in the Last Year: Not on file  . Ran Out of Food in the Last Year: Not on file  Transportation Needs:   . Lack of Transportation (Medical): Not on file  . Lack of Transportation  (Non-Medical): Not on file  Physical Activity:   . Days of Exercise per Week: Not on file  . Minutes of Exercise per Session: Not on file  Stress:   . Feeling of Stress : Not on file  Social Connections:   . Frequency of Communication with Friends and Family: Not on file  . Frequency of Social Gatherings with Friends and Family: Not on file  . Attends Religious Services: Not on file  . Active Member of Clubs or Organizations: Not on file  . Attends Archivist Meetings: Not on file  . Marital Status: Not on file  Intimate Partner Violence:   . Fear of Current or Ex-Partner: Not on file  . Emotionally Abused: Not on file  . Physically Abused: Not on file  . Sexually Abused: Not on file   Family History  Problem Relation Age of Onset  . Heart disease Mother   . Hypertension Mother   . Hyperlipidemia Mother   . Heart disease Father   . Glaucoma Father   . Breast cancer Neg Hx    Current Outpatient Medications on File Prior to Visit  Medication Sig  . albuterol (PROAIR HFA) 108 (90 Base) MCG/ACT inhaler Inhale 2 puffs into the lungs every 6 (six) hours as needed for wheezing or shortness of breath.  Marland Kitchen BLACK COHOSH EXTRACT PO Take daily by mouth.  . cyclobenzaprine (FLEXERIL) 10 MG tablet Take 0.5-1 tablets (5-10 mg total) by mouth 3 (three) times daily as needed for muscle spasms. Recommend start at night only  . fluticasone (FLONASE) 50 MCG/ACT nasal spray Place 2 sprays into both nostrils daily. Use for 4-6 weeks then stop and use seasonally or as needed.  . hydrOXYzine (ATARAX/VISTARIL) 25 MG tablet Take 1-2 tablets (25-50 mg total) by mouth at bedtime as needed for anxiety (insomnia).  . liraglutide (VICTOZA) 18 MG/3ML SOPN Inject 0.3 mLs (1.8 mg total) into the skin daily.  Marland Kitchen loratadine (CLARITIN) 10 MG tablet Take 1 tablet (10 mg total) by mouth daily. Use for 4-6 weeks then stop, and use as needed or seasonally  . meloxicam (MOBIC) 15 MG tablet Take 1 tablet (15 mg  total) by mouth daily. For 2-4 weeks then as needed  . metFORMIN (GLUCOPHAGE-XR) 500 MG 24 hr tablet TAKE 1 TABLET BY MOUTH ONCE DAILY WITH BREAKFAST  . PARoxetine (PAXIL) 20 MG tablet TAKE 1 TABLET BY MOUTH ONCE DAILY   No current facility-administered medications on file prior to visit.    Review of Systems Per HPI unless specifically indicated above      Objective:    Ht 5\' 10"  (1.778 m)   Wt 254 lb (115.2 kg)   BMI 36.45 kg/m   Wt Readings from Last 3 Encounters:  09/03/19 254 lb (115.2 kg)  07/05/18 256 lb 12.8 oz (116.5 kg)  04/21/18 260 lb (117.9 kg)    Physical Exam   Virtual visit on phone. No physical exam done.  Results for orders placed or performed in visit on 08/30/19  Lipid panel  Result Value Ref Range   Cholesterol 244 (H) <200 mg/dL   HDL 52 > OR = 50 mg/dL   Triglycerides 161 <096 mg/dL   LDL Cholesterol (Calc) 165 (H) mg/dL (calc)   Total CHOL/HDL Ratio 4.7 <5.0 (calc)   Non-HDL Cholesterol (Calc) 192 (H) <130 mg/dL (calc)  Comprehensive metabolic panel  Result Value Ref Range   Glucose, Bld 100 (H) 65 - 99 mg/dL   BUN 14 7 - 25 mg/dL   Creat 0.45 4.09 - 8.11 mg/dL   BUN/Creatinine Ratio NOT APPLICABLE 6 - 22 (calc)   Sodium 141 135 - 146 mmol/L   Potassium 4.4 3.5 - 5.3 mmol/L   Chloride 105 98 - 110 mmol/L   CO2 29 20 - 32 mmol/L   Calcium 9.0 8.6 - 10.4 mg/dL   Total Protein 6.6 6.1 - 8.1 g/dL   Albumin 4.1 3.6 - 5.1 g/dL   Globulin 2.5 1.9 - 3.7 g/dL (calc)   AG Ratio 1.6 1.0 - 2.5 (calc)   Total Bilirubin 0.4 0.2 - 1.2 mg/dL   Alkaline phosphatase (APISO) 87 37 - 153 U/L   AST 16 10 - 35 U/L   ALT 20 6 - 29 U/L  Hemoglobin A1c  Result Value Ref Range   Hgb A1c MFr Bld 6.4 (H) <5.7 % of total Hgb   Mean Plasma Glucose 137 (calc)   eAG (mmol/L) 7.6 (calc)  TSH  Result Value Ref Range   TSH 1.63 0.40 - 4.50 mIU/L  CBC with Differential/Platelet  Result Value Ref Range   WBC 5.0 3.8 - 10.8 Thousand/uL   RBC 4.56 3.80 - 5.10  Million/uL   Hemoglobin 13.4 11.7 - 15.5 g/dL   HCT 91.4 78.2 - 95.6 %   MCV 87.3 80.0 - 100.0 fL   MCH 29.4 27.0 - 33.0 pg   MCHC 33.7 32.0 - 36.0 g/dL   RDW 21.3 08.6 - 57.8 %   Platelets 273 140 - 400 Thousand/uL   MPV 10.3 7.5 - 12.5 fL   Neutro Abs 2,615 1,500 - 7,800 cells/uL   Lymphs Abs 1,625 850 - 3,900 cells/uL   Absolute Monocytes 530 200 - 950 cells/uL   Eosinophils Absolute 180 15 - 500 cells/uL   Basophils Absolute 50 0 - 200 cells/uL   Neutrophils Relative % 52.3 %   Total Lymphocyte 32.5 %   Monocytes Relative 10.6 %   Eosinophils Relative 3.6 %   Basophils Relative 1.0 %  VITAMIN D 25 Hydroxy (Vit-D Deficiency, Fractures)  Result Value Ref Range   Vit D, 25-Hydroxy 26 (L) 30 - 100 ng/mL      Assessment & Plan:   Problem List Items Addressed This Visit    Prediabetes    Elevated A1c up to 6.4, non adherent to lifestyle Concern with obesity, HTN, HLD, OSA  Plan:  1. Metformin XR 500mg  daily, Victoza 2. Encourage improved lifestyle - diet / exercise 3. Follow-up 4 months PreDM A1c, wt check      OSA (obstructive sleep apnea)    Continue to try improve CPAP adherence Has improved breathing at times, if can tolerate mask      Morbid obesity (HCC)    Clinically consistent with Morbid Obesity with BMI >36 (< 40) due to comorbid conditions with OSA, HLD, GERD Some weight loss with lifestyle intervention and medicine - Prior failed Phentermine med - Improved A1c. Normal TSH  Plan:  On GLP1, metformin - Counseling on encourage lifestyle changes to improve regular exercise and continue diet  Follow-up 4 months wt check / A1c      Hyperlipidemia    Elevated LDL up to 160, otherwise controlled Last lipid panel 08/2019 Calculated ASCVD 10 yr risk score approx 3%  Plan: 1. Discussed ASCVD risk - defer statin for now 2. Encourage improved lifestyle - low carb/cholesterol, reduce portion size, continue improving regular exercise 3. Follow-up lipids  yearly      Relevant Medications   furosemide (LASIX) 20 MG tablet   GERD (gastroesophageal reflux disease)    Refractory GERD to H2 blocker Improved on PPI PM dosing Advised dietary adjustment Restart PPI rx Omeprazole 20mg  BID - DC famotidine      Relevant Medications   omeprazole (PRILOSEC) 20 MG capsule    Other Visit Diagnoses    Annual physical exam    -  Primary   RLS (restless legs syndrome)       Relevant Medications   rOPINIRole (REQUIP) 0.25 MG tablet   Peripheral edema       Relevant Medications   furosemide (LASIX) 20 MG tablet   Encounter for screening mammogram for malignant neoplasm of breast       Relevant Orders   MM DIGITAL SCREENING BILATERAL      Updated Health Maintenance information Reviewed recent lab results with patient Encouraged improvement to lifestyle with diet and exercise - Goal of weight loss - Emphasis on reduce regular sodas    Meds ordered this encounter  Medications  . rOPINIRole (REQUIP) 0.25 MG tablet    Sig: TAKE 1 OR 2 TABLETS BY MOUTH AT BEDTIME AS NEEDED (RESTLESS LEGS)    Dispense:  180 tablet    Refill:  1  . furosemide (LASIX) 20 MG tablet    Sig: Take 1 tablet (20 mg total) by mouth daily.    Dispense:  30 tablet    Refill:  0  . omeprazole (PRILOSEC) 20 MG capsule    Sig: Take 1 capsule (20 mg total) by mouth 2 (two) times daily before a meal.    Dispense:  180 capsule    Refill:  1      Follow up plan: Return in about 4 months (around 01/01/2020) for 4 month DM A1c.   Patient verbalizes understanding with the above medical recommendations including the limitation of remote medical advice.  Specific follow-up and call-back criteria were given for patient to follow-up or seek medical care more urgently if needed.  Total duration of direct patient care provided via telephone: 15 minutes   01/03/2020, DO [facility] 09/03/2019, 10:12 AM

## 2019-09-03 NOTE — Assessment & Plan Note (Signed)
Continue to try improve CPAP adherence Has improved breathing at times, if can tolerate mask

## 2019-09-03 NOTE — Assessment & Plan Note (Signed)
Clinically consistent with Morbid Obesity with BMI >36 (< 40) due to comorbid conditions with OSA, HLD, GERD Some weight loss with lifestyle intervention and medicine - Prior failed Phentermine med - Improved A1c. Normal TSH  Plan: On GLP1, metformin - Counseling on encourage lifestyle changes to improve regular exercise and continue diet  Follow-up 4 months wt check / A1c

## 2019-09-03 NOTE — Assessment & Plan Note (Signed)
Refractory GERD to H2 blocker Improved on PPI PM dosing Advised dietary adjustment Restart PPI rx Omeprazole 20mg  BID - DC famotidine

## 2019-09-03 NOTE — Assessment & Plan Note (Signed)
Elevated LDL up to 160, otherwise controlled Last lipid panel 08/2019 Calculated ASCVD 10 yr risk score approx 3%  Plan: 1. Discussed ASCVD risk - defer statin for now 2. Encourage improved lifestyle - low carb/cholesterol, reduce portion size, continue improving regular exercise 3. Follow-up lipids yearly

## 2019-09-03 NOTE — Patient Instructions (Addendum)
Refilled meds  Goal to limit or reduce Sodas Increase water  Recent Labs    11/03/18 0837 08/30/19 0921  HGBA1C 6.1* 6.4*    At risk of type 2 diabetes, and higher cholesterol now.  Goal to improve diet and exercise and follow-up within 3-4 months to re-check sugar  Please schedule a Follow-up Appointment to: Return in about 4 months (around 01/01/2020) for 4 month DM A1c.  If you have any other questions or concerns, please feel free to call the office or send a message through MyChart. You may also schedule an earlier appointment if necessary.  Additionally, you may be receiving a survey about your experience at our office within a few days to 1 week by e-mail or mail. We value your feedback.  Saralyn Pilar, DO [facility], [place]

## 2019-09-03 NOTE — Assessment & Plan Note (Signed)
Elevated A1c up to 6.4, non adherent to lifestyle Concern with obesity, HTN, HLD, OSA  Plan:  1. Metformin XR 500mg  daily, Victoza 2. Encourage improved lifestyle - diet / exercise 3. Follow-up 4 months PreDM A1c, wt check

## 2020-01-07 LAB — HM DIABETES EYE EXAM

## 2020-02-08 ENCOUNTER — Other Ambulatory Visit: Payer: Self-pay | Admitting: Family Medicine

## 2020-02-08 DIAGNOSIS — N951 Menopausal and female climacteric states: Secondary | ICD-10-CM

## 2020-02-11 ENCOUNTER — Encounter: Payer: Self-pay | Admitting: Family Medicine

## 2020-02-11 ENCOUNTER — Ambulatory Visit: Payer: BC Managed Care – PPO | Admitting: Family Medicine

## 2020-02-11 ENCOUNTER — Other Ambulatory Visit: Payer: Self-pay

## 2020-02-11 VITALS — BP 130/82 | HR 82 | Temp 97.7°F | Resp 16 | Ht 70.0 in | Wt 268.0 lb

## 2020-02-11 DIAGNOSIS — R7303 Prediabetes: Secondary | ICD-10-CM | POA: Diagnosis not present

## 2020-02-11 DIAGNOSIS — Z8249 Family history of ischemic heart disease and other diseases of the circulatory system: Secondary | ICD-10-CM

## 2020-02-11 DIAGNOSIS — R0609 Other forms of dyspnea: Secondary | ICD-10-CM

## 2020-02-11 DIAGNOSIS — E782 Mixed hyperlipidemia: Secondary | ICD-10-CM

## 2020-02-11 DIAGNOSIS — Z1211 Encounter for screening for malignant neoplasm of colon: Secondary | ICD-10-CM

## 2020-02-11 DIAGNOSIS — Z23 Encounter for immunization: Secondary | ICD-10-CM | POA: Diagnosis not present

## 2020-02-11 DIAGNOSIS — Z9989 Dependence on other enabling machines and devices: Secondary | ICD-10-CM

## 2020-02-11 DIAGNOSIS — G4733 Obstructive sleep apnea (adult) (pediatric): Secondary | ICD-10-CM | POA: Diagnosis not present

## 2020-02-11 DIAGNOSIS — R0600 Dyspnea, unspecified: Secondary | ICD-10-CM

## 2020-02-11 LAB — POCT GLYCOSYLATED HEMOGLOBIN (HGB A1C): Hemoglobin A1C: 6.4 % — AB (ref 4.0–5.6)

## 2020-02-11 MED ORDER — VITAMIN D3 50 MCG (2000 UT) PO TABS: 2000.0000 [IU] | ORAL_TABLET | Freq: Every day | ORAL | Status: DC

## 2020-02-11 MED ORDER — SAXENDA 18 MG/3ML ~~LOC~~ SOPN: PEN_INJECTOR | SUBCUTANEOUS | 2 refills | Status: DC

## 2020-02-11 NOTE — Assessment & Plan Note (Signed)
Controlled, chronic OSA on CPAP Improving adherence - Continue current CPAP therapy, patient seems to be benefiting from therapy

## 2020-02-11 NOTE — Assessment & Plan Note (Signed)
Clinically consistent with Morbid Obesity with BMI >38 (< 40) due to comorbid conditions with OSA, HLD, GERD Some weight loss with lifestyle intervention and medicine - Prior failed Phentermine med - Improved A1c. Normal TSH  Plan: RESTART GLP1 Saxenda, will submit rx and work on prior auth if requested, and free sample 1 pen today Continue Metformin 500 daily - Counseling on encourage lifestyle changes to improve regular exercise and continue diet Additionally discussed other options Wegovy semaglutide injectable for wt loss, vs consider Contrave oral option

## 2020-02-11 NOTE — Progress Notes (Signed)
Subjective:    Patient ID: Ashley Hale, female    DOB: [birth year], 59 y.o.   MRN: [id number]  Ashley Hale is a 59 y.o. female presenting on 02/11/2020 for PreDM and Obesity   HPI   Pre-Diabetes /MorbidObesity BMI >38 / Hyperlipidemia see prior notes for background information. Had been sedentary in past year approx Prior A1c around 6.1 to 6.4, due today Previously on Victoza up to 1.8mg  daily dose. Off med. CBGs:Not checking CBG Meds: Metformin XR500mg everyday Reports good compliance. Tolerating well w/o side-effects Currentlynot onACEi / ARB Lifestyle: - Weight increased 14 lbs in 5 months approx - Diet (still improving diet) - Exercise (had improved exercisebut seems to not be consistent) Denies hypoglycemia, polyuria, visual changes, numbness or tingling.  Dyspnea on Exertion Family history of CAD both parents, and both brothers. She is requesting further evaluation including cardiology work up stress test. Denies chest pain  OSA on CPAP Adhering to CPAP most nights, has some issue with difficulty breathing with it.  Health Maintenance:  Overdue colon cancer screening. Documented 2017. Will re order Cologuard did not receive since prior order.  No longer indicated for pap smears due to s/p hysterectomy and cervix removal Due for mammogram last 2018, order placed.  Depression screen [facility] 2/9 02/11/2020 11/06/2018 07/05/2018  Decreased Interest 0 0 0  Down, Depressed, Hopeless 0 0 0  PHQ - 2 Score 0 0 0  Altered sleeping - - -  Tired, decreased energy - - -  Change in appetite - - -  Feeling bad or failure about yourself  - - -  Trouble concentrating - - -  Moving slowly or fidgety/restless - - -  Suicidal thoughts - - -  PHQ-9 Score - - -  Difficult doing work/chores - - -    Social History   Tobacco Use  . Smoking status: Never Smoker  . Smokeless tobacco: Never Used  Substance Use Topics  . Alcohol use: No    Alcohol/week: 0.0 standard drinks   . Drug use: No    Review of Systems Per HPI unless specifically indicated above     Objective:    BP (!) 130/82 (BP Location: Left Arm, Cuff Size: Large)   Pulse 82   Temp 97.7 F (36.5 C) (Temporal)   Resp 16   Ht 5\' 10"  (1.778 m)   Wt (!) 268 lb (121.6 kg)   SpO2 98%   BMI 38.45 kg/m   Wt Readings from Last 3 Encounters:  02/11/20 (!) 268 lb (121.6 kg)  09/03/19 254 lb (115.2 kg)  07/05/18 256 lb 12.8 oz (116.5 kg)    Physical Exam   Recent Labs    08/30/19 0921 02/11/20 0859  HGBA1C 6.4* 6.4*     Results for orders placed or performed in visit on 02/11/20  POCT HgB A1C  Result Value Ref Range   Hemoglobin A1C 6.4 (A) 4.0 - 5.6 %      Assessment & Plan:   Problem List Items Addressed This Visit    Prediabetes - Primary    Stable, Elevated A1c up to 6.4 Concern with obesity, HTN, HLD, OSA  Plan:  1. Metformin XR 500mg  daily - RE order 02/13/20 now. 2. Encourage improved lifestyle - diet / exercise 3. Follow-up 3 months PreDM A1c, wt check      Relevant Orders   POCT HgB A1C (Completed)   Ambulatory referral to Cardiology   OSA on CPAP    Controlled, chronic OSA on CPAP Improving  adherence - Continue current CPAP therapy, patient seems to be benefiting from therapy        Morbid obesity (HCC)    Clinically consistent with Morbid Obesity with BMI >38 (< 40) due to comorbid conditions with OSA, HLD, GERD Some weight loss with lifestyle intervention and medicine - Prior failed Phentermine med - Improved A1c. Normal TSH  Plan: RESTART GLP1 Saxenda, will submit rx and work on prior auth if requested, and free sample 1 pen today Continue Metformin 500 daily - Counseling on encourage lifestyle changes to improve regular exercise and continue diet Additionally discussed other options Wegovy semaglutide injectable for wt loss, vs consider Contrave oral option      Relevant Medications   SAXENDA 18 MG/3ML SOPN   Other Relevant Orders   Ambulatory  referral to Cardiology   Hyperlipidemia    Elevated LDL 165 Increased cardiac risk factor Will pursue cardiology referral see A&P      Relevant Orders   Ambulatory referral to Cardiology    Other Visit Diagnoses    Need for shingles vaccine       Relevant Orders   Varicella-zoster vaccine IM (Completed)   Screening for colon cancer       Relevant Orders   Cologuard   Dyspnea on exertion       Relevant Orders   Ambulatory referral to Cardiology   Family history of heart disease       Relevant Orders   Ambulatory referral to Cardiology      Referral to Cardiology for further evaluation with DOE and testing likely stress test, given multiple comorbid cardiac risk factors and family history heart disease  Due for routine colon cancer screening.  - Discussion today about recommendations for either Colonoscopy or Cologuard screening, benefits and risks of screening, interested in Cologuard, understands that if positive then recommendation is for diagnostic colonoscopy to follow-up. - RE-Ordered Cologuard today   ____________________________________________________ Additional Rx Information (May be used for Prior Authorization if required)  Medication name and Strength: Saxenda 0.6 titration dose mg  1. Primary Diagnosis and ICD10 Code: Morbid Obesity (E66.01) 2. Secondary Diagnosis and ICD10 Code: Pre-Diabetes (R73.03), Hyperlipidemia (E78.2) 3. Previous Failed Medications (Duration or Start Date) a. Phentermine (2006-2007) b. Metformin (2019-current) 4. Quantity and Duration of New Medication: 4mL for 30 days 5. Additional Supporting Information: a. Lab result 6.4 A1c b. Hyperlipidemia total chol 244, HDL LDL 165 c. Weight Watchers program completed 2019 ____________________________________________________  Orders Placed This Encounter  Procedures  . Varicella-zoster vaccine IM  . Cologuard  . Ambulatory referral to Cardiology    Referral Priority:   Routine     Referral Type:   Consultation    Referral Reason:   Specialty Services Required    Requested Specialty:   Cardiology    Number of Visits Requested:   1  . POCT HgB A1C     Meds ordered this encounter  Medications  . SAXENDA 18 MG/3ML SOPN    Sig: Initial: 0.6 mg once daily for 1 week; increase by 0.6 mg daily at weekly intervals to a target dose of 3 mg once daily    Dispense:  3 mL    Refill:  2    Weight loss  . Cholecalciferol (VITAMIN D3) 50 MCG (2000 UT) TABS    Sig: Take 2,000 Units by mouth daily.     Follow up plan: Return in about 3 months (around 05/13/2020) for 3 month PreDM A1c, Weight, 2nd dose Shingrix.  Saralyn Pilar, DO [facility] 02/11/2020, 8:58 AM

## 2020-02-11 NOTE — Assessment & Plan Note (Signed)
Elevated LDL 165 Increased cardiac risk factor Will pursue cardiology referral see A&P

## 2020-02-11 NOTE — Assessment & Plan Note (Signed)
Stable, Elevated A1c up to 6.4 Concern with obesity, HTN, HLD, OSA  Plan:  1. Metformin XR 500mg  daily - RE order now. 2. Encourage improved lifestyle - diet / exercise 3. Follow-up 3 months PreDM A1c, wt check

## 2020-02-11 NOTE — Patient Instructions (Addendum)
Thank you for coming to the office today.  Referral to [facility] Cardiology - stay tuned for apt, they will call. Goal is to do a heart work up test and stress test.  [facility]) [facility] at [facility] [address] [place], [place] [address] Main: [phone]   Let me know dates of COVID vaccine - send message on mychart or can call.  Ordered Saxenda - will try to get approved.  We can consider Contrave (pill) appetite suppression in future if need in addition.  Recent Labs    08/30/19 0921 02/11/20 0859  HGBA1C 6.4* 6.4*    Please schedule a Follow-up Appointment to: Return in about 3 months (around 05/13/2020) for 3 month PreDM A1c, Weight, 2nd dose Shingrix.  If you have any other questions or concerns, please feel free to call the office or send a message through MyChart. You may also schedule an earlier appointment if necessary.  Additionally, you may be receiving a survey about your experience at our office within a few days to 1 week by e-mail or mail. We value your feedback.  Saralyn Pilar, DO [facility], [place]

## 2020-02-12 ENCOUNTER — Encounter: Payer: Self-pay | Admitting: Family Medicine

## 2020-02-14 ENCOUNTER — Encounter: Payer: Self-pay | Admitting: Family Medicine

## 2020-02-14 NOTE — Progress Notes (Signed)
Submitted PA 02/11/20 with new rx Saxenda.  Received approval for Saxenda - from 02/13/20 - 06/15/20.  Saralyn Pilar, DO [facility] 02/14/2020, 2:14 PM

## 2020-02-22 LAB — COLOGUARD: Cologuard: NEGATIVE

## 2020-02-28 LAB — COLOGUARD: COLOGUARD: NEGATIVE

## 2020-03-04 ENCOUNTER — Encounter: Payer: Self-pay | Admitting: Family Medicine

## 2020-03-17 ENCOUNTER — Ambulatory Visit: Payer: BC Managed Care – PPO | Admitting: Cardiology

## 2020-03-17 ENCOUNTER — Other Ambulatory Visit: Payer: Self-pay

## 2020-03-17 ENCOUNTER — Encounter: Payer: Self-pay | Admitting: Cardiology

## 2020-03-17 VITALS — BP 118/88 | HR 79 | Ht 70.0 in | Wt 262.8 lb

## 2020-03-17 DIAGNOSIS — G4733 Obstructive sleep apnea (adult) (pediatric): Secondary | ICD-10-CM

## 2020-03-17 DIAGNOSIS — R0609 Other forms of dyspnea: Secondary | ICD-10-CM

## 2020-03-17 DIAGNOSIS — E7800 Pure hypercholesterolemia, unspecified: Secondary | ICD-10-CM | POA: Diagnosis not present

## 2020-03-17 DIAGNOSIS — R0600 Dyspnea, unspecified: Secondary | ICD-10-CM

## 2020-03-17 DIAGNOSIS — Z6837 Body mass index (BMI) 37.0-37.9, adult: Secondary | ICD-10-CM

## 2020-03-17 NOTE — Progress Notes (Signed)
Cardiology Office Note:    Date:  03/17/2020   ID:  Ashley Hale, DOB 11/20/1960, MRN [id number]  PCP:  Smitty CordsKaramalegos, Alexander J, DO  [facility] Cardiologist:  Debbe OdeaBrian Agbor-Etang, MD  [facility] Electrophysiologist:  None   Referring MD: Saralyn PilarKaramalegos, Alexander *   Chief Complaint  Patient presents with  . New Patient (Initial Visit)    Establish care beacuse she has been having some SOB and hx of family heart disease. Medications verbally reviewed with patient.    Ashley Hale is a 59 y.o. female who is being seen today for the evaluation of shortness of breath at the request of Saralyn PilarKaramalegos, Alexander *.   History of Present Illness:    Ashley Hale is a 59 y.o. female with a hx of GERD, pre-diabetes, OSA on cpap,  who presents due to shortness of breath.  Patient states having shortness of breath typically with exertion which has worsened over the past 6 months.  She denies chest pain, palpitations, dizziness.  She was diagnosed with Covid roughly 9 months ago, her only symptoms where vomiting and diarrhea.  Denies any respiratory symptoms.  She owns a cleaning business and has noticed being out of breath when she tries to clean.  Walking also causes her to be short of breath.  She has a family history of heart attack in 2 brothers in their 4750s.  Mom had stents in her 6870s.  She states gaining roughly 70 pounds over the past year which she attributes to not eating right, being sedentary.  Her primary care physician started her on some medication and she has lost about 8 pounds over the past 2 weeks.  She denies smoking, denies any history of heart disease.  She has been diagnosed with sleep apnea, but not compliant with her CPAP mask.  Past Medical History:  Diagnosis Date  . GERD (gastroesophageal reflux disease)   . IBS (irritable bowel syndrome)   . Obesity     Past Surgical History:  Procedure Laterality Date  . ABDOMINAL HYSTERECTOMY    . BACK SURGERY  1992   Lumbar  spine, herniated disc    Current Medications: Current Meds  Medication Sig  . BLACK COHOSH EXTRACT PO Take daily by mouth.  . Cholecalciferol (VITAMIN D3) 50 MCG (2000 UT) TABS Take 2,000 Units by mouth daily.  . cyclobenzaprine (FLEXERIL) 10 MG tablet Take 0.5-1 tablets (5-10 mg total) by mouth 3 (three) times daily as needed for muscle spasms. Recommend start at night only  . fluticasone (FLONASE) 50 MCG/ACT nasal spray Place 2 sprays into both nostrils daily. Use for 4-6 weeks then stop and use seasonally or as needed.  . furosemide (LASIX) 20 MG tablet Take 1 tablet (20 mg total) by mouth daily.  . hydrOXYzine (ATARAX/VISTARIL) 25 MG tablet Take 1-2 tablets (25-50 mg total) by mouth at bedtime as needed for anxiety (insomnia).  . loratadine (CLARITIN) 10 MG tablet Take 1 tablet (10 mg total) by mouth daily. Use for 4-6 weeks then stop, and use as needed or seasonally  . metFORMIN (GLUCOPHAGE-XR) 500 MG 24 hr tablet TAKE 1 TABLET BY MOUTH ONCE DAILY WITH BREAKFAST  . omeprazole (PRILOSEC) 20 MG capsule Take 1 capsule (20 mg total) by mouth 2 (two) times daily before a meal.  . PARoxetine (PAXIL) 20 MG tablet TAKE 1 TABLET BY MOUTH ONCE DAILY  . rOPINIRole (REQUIP) 0.25 MG tablet TAKE 1 OR 2 TABLETS BY MOUTH AT BEDTIME AS NEEDED (RESTLESS LEGS)  . SAXENDA 18  MG/3ML SOPN Initial: 0.6 mg once daily for 1 week; increase by 0.6 mg daily at weekly intervals to a target dose of 3 mg once daily     Allergies:   Shellfish allergy and Sulfa antibiotics   Social History   Socioeconomic History  . Marital status: Married    Spouse name: Not on file  . Number of children: Not on file  . Years of education: Not on file  . Highest education level: Not on file  Occupational History  . Not on file  Tobacco Use  . Smoking status: Never Smoker  . Smokeless tobacco: Never Used  Substance and Sexual Activity  . Alcohol use: No    Alcohol/week: 0.0 standard drinks  . Drug use: No  . Sexual  activity: Not on file  Other Topics Concern  . Not on file  Social History Narrative  . Not on file   Social Determinants of Health   Financial Resource Strain:   . Difficulty of Paying Living Expenses: Not on file  Food Insecurity:   . Worried About Programme researcher, broadcasting/film/video in the Last Year: Not on file  . Ran Out of Food in the Last Year: Not on file  Transportation Needs:   . Lack of Transportation (Medical): Not on file  . Lack of Transportation (Non-Medical): Not on file  Physical Activity:   . Days of Exercise per Week: Not on file  . Minutes of Exercise per Session: Not on file  Stress:   . Feeling of Stress : Not on file  Social Connections:   . Frequency of Communication with Friends and Family: Not on file  . Frequency of Social Gatherings with Friends and Family: Not on file  . Attends Religious Services: Not on file  . Active Member of Clubs or Organizations: Not on file  . Attends Banker Meetings: Not on file  . Marital Status: Not on file     Family History: The patient's family history includes Glaucoma in her father; Heart disease in her father and mother; Hyperlipidemia in her mother; Hypertension in her mother. There is no history of Breast cancer.  ROS:   Please see the history of present illness.     All other systems reviewed and are negative.  EKGs/Labs/Other Studies Reviewed:    The following studies were reviewed today:   EKG:  EKG is  ordered today.  The ekg ordered today demonstrates normal sinus rhythm, normal ECG.  Recent Labs: 08/30/2019: ALT 20; BUN 14; Creat 0.98; Hemoglobin 13.4; Platelets 273; Potassium 4.4; Sodium 141; TSH 1.63  Recent Lipid Panel    Component Value Date/Time   CHOL 244 (H) 08/30/2019 0921   TRIG 135 08/30/2019 0921   HDL 52 08/30/2019 0921   CHOLHDL 4.7 08/30/2019 0921   VLDL 31 09/09/2015 0745   LDLCALC 165 (H) 08/30/2019 0921    Physical Exam:    VS:  BP 118/88 (BP Location: Right Arm, Patient  Position: Sitting, Cuff Size: Large)   Pulse 79   Ht 5\' 10"  (1.778 m)   Wt 262 lb 12.8 oz (119.2 kg)   SpO2 98%   BMI 37.71 kg/m     Wt Readings from Last 3 Encounters:  03/17/20 262 lb 12.8 oz (119.2 kg)  02/11/20 (!) 268 lb (121.6 kg)  09/03/19 254 lb (115.2 kg)     GEN:  Well nourished, well developed in no acute distress HEENT: Normal NECK: No JVD; No carotid bruits LYMPHATICS: No lymphadenopathy  CARDIAC: RRR, no murmurs, rubs, gallops RESPIRATORY:  Clear to auscultation without rales, wheezing or rhonchi  ABDOMEN: Soft, non-tender, non-distended MUSCULOSKELETAL:  No edema; No deformity  SKIN: Warm and dry NEUROLOGIC:  Alert and oriented x 3 PSYCHIATRIC:  Normal affect   ASSESSMENT:    1. Dyspnea on exertion   2. BMI 37.0-37.9, adult   3. Pure hypercholesterolemia   4. OSA (obstructive sleep apnea)    PLAN:    In order of problems listed above:  1. Patient with history of dyspnea on exertion, denies chest pain.  Has risk factors of obesity, sleep apnea, family history of CAD.  She is not compliant with her CPAP mask, this in addition to obesity could be contributing.  We will evaluate patient with an echocardiogram.  She was counseled to be compliant with CPAP mask.  If echo comes out normal, she continues to lose weight and also start using her CPAP mask, will consider stress testing if symptoms do not improve. 2. Patient with history of obesity, low-calorie diet, weight loss advised. 3. History of hyperlipidemia, LDL elevated at 165.  10-year ASCVD risk 3.7 %.  Not in aspirin or statin benefit group.  Low-cholesterol diet advised. 4. Patient with diagnosis of sleep apnea, not compliant with CPAP mask.  Compliance advised.  Follow-up after echocardiogram.    This note was generated in part or whole with voice recognition software. Voice recognition is usually quite accurate but there are transcription errors that can and very often do occur. I apologize for any  typographical errors that were not detected and corrected.  Medication Adjustments/Labs and Tests Ordered: Current medicines are reviewed at length with the patient today.  Concerns regarding medicines are outlined above.  Orders Placed This Encounter  Procedures  . EKG 12-Lead  . ECHOCARDIOGRAM COMPLETE   No orders of the defined types were placed in this encounter.   Patient Instructions  Medication Instructions:  Your physician recommends that you continue on your current medications as directed. Please refer to the Current Medication list given to you today.  *If you need a refill on your cardiac medications before your next appointment, please call your pharmacy*   Lab Work: None Ordered If you have labs (blood work) drawn today and your tests are completely normal, you will receive your results only by: Marland Kitchen MyChart Message (if you have MyChart) OR . A paper copy in the mail If you have any lab test that is abnormal or we need to change your treatment, we will call you to review the results.   Testing/Procedures:  1.  Your physician has requested that you have an echocardiogram. Echocardiography is a painless test that uses sound waves to create images of your heart. It provides your doctor with information about the size and shape of your heart and how well your heart's chambers and valves are working. This procedure takes approximately one hour. There are no restrictions for this procedure.    Follow-Up: At [facility], you and your health needs are our priority.  As part of our continuing mission to provide you with exceptional heart care, we have created designated Provider Care Teams.  These Care Teams include your primary Cardiologist (physician) and Advanced Practice Providers (APPs -  Physician Assistants and Nurse Practitioners) who all work together to provide you with the care you need, when you need it.  We recommend signing up for the patient portal called  "MyChart".  Sign up information is provided on this After Visit  Summary.  MyChart is used to connect with patients for Virtual Visits (Telemedicine).  Patients are able to view lab/test results, encounter notes, upcoming appointments, etc.  Non-urgent messages can be sent to your provider as well.   To learn more about what you can do with MyChart, go to [email or web address].    Your next appointment:   Follow up after Echo  The format for your next appointment:   In Person  Provider:   Debbe Odea, MD   Other Instructions       Signed, Debbe Odea, MD  03/17/2020 12:31 PM    [facility]

## 2020-03-17 NOTE — Patient Instructions (Signed)

## 2020-03-21 ENCOUNTER — Other Ambulatory Visit: Payer: Self-pay

## 2020-03-21 ENCOUNTER — Telehealth (INDEPENDENT_AMBULATORY_CARE_PROVIDER_SITE_OTHER): Payer: BC Managed Care – PPO | Admitting: Family Medicine

## 2020-03-21 ENCOUNTER — Encounter: Payer: Self-pay | Admitting: Family Medicine

## 2020-03-21 DIAGNOSIS — H1032 Unspecified acute conjunctivitis, left eye: Secondary | ICD-10-CM | POA: Diagnosis not present

## 2020-03-21 MED ORDER — POLYMYXIN B-TRIMETHOPRIM 10000-0.1 UNIT/ML-% OP SOLN: 1.0000 [drp] | OPHTHALMIC | 0 refills | Status: DC

## 2020-03-21 NOTE — Progress Notes (Signed)
Subjective:    Patient ID: Ashley Hale, female    DOB: 10-Dec-1960, 59 y.o.   MRN: [id number]  Ashley Hale is a 59 y.o. female presenting on 03/21/2020 for Eye Pain (Left side onset 3 days--itchy, painful, runny, redness can't able to wear contact lanses)  Virtual / Telehealth Encounter - Video Visit via MyChart The purpose of this virtual visit is to provide medical care while limiting exposure to the novel coronavirus (COVID19) for both patient and office staff.  Consent was obtained for remote visit:  Yes.   Answered questions that patient had about telehealth interaction:  Yes.   I discussed the limitations, risks, security and privacy concerns of performing an evaluation and management service by video/telephone. I also discussed with the patient that there may be a patient responsible charge related to this service. The patient expressed understanding and agreed to proceed.  Patient Location: Home Provider Location: [facility] (Office)   HPI   LEFT Eye Conjunctivitis, possible bacterial Recent onset, within past 3 days, admits she was at pool recently, she said did not wear sunglasses, irritated her eye - She described some thicker discharge from Left eye, and redness now over past 3 days, admits some itchy and occasional clear drainage from irritation, will have crusty discharge in AM - Not using eye drops - Tried warm compresses Admits occasional blurry vision only with discharge in eye Denies any decreased vision Denies any fever chills sweats, cough congestion, headache   Depression screen [facility] 2/9 02/11/2020 11/06/2018 07/05/2018  Decreased Interest 0 0 0  Down, Depressed, Hopeless 0 0 0  PHQ - 2 Score 0 0 0  Altered sleeping - - -  Tired, decreased energy - - -  Change in appetite - - -  Feeling bad or failure about yourself  - - -  Trouble concentrating - - -  Moving slowly or fidgety/restless - - -  Suicidal thoughts - - -  PHQ-9 Score - - -    Difficult doing work/chores - - -    Social History   Tobacco Use  . Smoking status: Never Smoker  . Smokeless tobacco: Never Used  Substance Use Topics  . Alcohol use: No    Alcohol/week: 0.0 standard drinks  . Drug use: No    Review of Systems Per HPI unless specifically indicated above     Objective:    There were no vitals taken for this visit.  Wt Readings from Last 3 Encounters:  03/17/20 262 lb 12.8 oz (119.2 kg)  02/11/20 (!) 268 lb (121.6 kg)  09/03/19 254 lb (115.2 kg)    Physical Exam   Note examination was completely remotely via video observation objective data only  Gen - well-appearing, no acute distress or apparent pain, comfortable HEENT - Left eye mild conjunctival injection, no surrounding eyelid or periorbital erythema or edema, no active discharge Heart/Lungs - cannot examine virtually - observed no evidence of coughing or labored breathing. Skin - face visible today- no rash Neuro - awake, alert, oriented Psych - not anxious appearing   Results for orders placed or performed in visit on 03/04/20  Cologuard  Result Value Ref Range   Cologuard Negative Negative      Assessment & Plan:   Problem List Items Addressed This Visit    None    Visit Diagnoses    Acute bacterial conjunctivitis of left eye    -  Primary   Relevant Medications   trimethoprim-polymyxin b (POLYTRIM) ophthalmic  solution      Acute L conjunctivitis for past 3 days, with mild to moderate scleral/conjunctival injection with history of more purulent discharge vs crusting. Suspected viral vs bacterial etiology no sick contact, no URI symptoms - Normal visual acuity is reported by patient - No evidence of complication, foreign body, or extending eyelid / pre-septal cellulitis  Plan: 1. Start Polytrim antibiotic eye drops 1 drop in L both eye every 4 hours for 7-10 days, may add to R eye if indicated 2. Keep moist warm compresses over Left eyelid 10-15 min at a time, up  to 4-6 times daily until resolution 3. Frequent hand washing, avoid rubbing / scratching eye 4. Strict return precautions for spreading infection 5. Follow-up 1-2 weeks as needed  Future if preferred - we can offer - Alternative Erythromycin oph ointment eye up to 4 times daily up to 2 weeks, may stop after 1 week if resolved   Meds ordered this encounter  Medications  . trimethoprim-polymyxin b (POLYTRIM) ophthalmic solution    Sig: Place 1 drop into the left eye every 4 (four) hours. May start for Right eye if develops symptoms. For 7 to 14 days.    Dispense:  10 mL    Refill:  0      Follow up plan: Return in about 1 week (around 03/28/2020), or if symptoms worsen or fail to improve, for conjunctivitis.   Patient verbalizes understanding with the above medical recommendations including the limitation of remote medical advice.  Specific follow-up and call-back criteria were given for patient to follow-up or seek medical care more urgently if needed.  Total duration of direct patient care provided via video conference: 15 minutes    Saralyn Pilar, DO [facility] 03/21/2020, 10:37 AM

## 2020-03-21 NOTE — Patient Instructions (Addendum)
1. Start Polytrim antibiotic eye drops 1 drop in L both eye every 4 hours for 7-10 days, may add to R eye if indicated 2. Keep moist warm compresses over Left eyelid 10-15 min at a time, up to 4-6 times daily until resolution 3. Frequent hand washing, avoid rubbing / scratching eye 4. Strict return precautions for spreading infection 5. Follow-up 1-2 weeks as needed  Future if preferred - we can offer - Alternative Erythromycin oph ointment eye up to 4 times daily up to 2 weeks, may stop after 1 week if resolved Please schedule a Follow-up Appointment to: Return in about 1 week (around 03/28/2020), or if symptoms worsen or fail to improve, for conjunctivitis.  If you have any other questions or concerns, please feel free to call the office or send a message through MyChart. You may also schedule an earlier appointment if necessary.  Additionally, you may be receiving a survey about your experience at our office within a few days to 1 week by e-mail or mail. We value your feedback.  Saralyn Pilar, DO [facility], [place]

## 2020-04-09 ENCOUNTER — Ambulatory Visit (INDEPENDENT_AMBULATORY_CARE_PROVIDER_SITE_OTHER): Payer: BC Managed Care – PPO

## 2020-04-09 ENCOUNTER — Other Ambulatory Visit: Payer: Self-pay

## 2020-04-09 DIAGNOSIS — Z23 Encounter for immunization: Secondary | ICD-10-CM | POA: Diagnosis not present

## 2020-04-14 ENCOUNTER — Other Ambulatory Visit: Payer: Self-pay

## 2020-04-14 ENCOUNTER — Ambulatory Visit (INDEPENDENT_AMBULATORY_CARE_PROVIDER_SITE_OTHER): Payer: BC Managed Care – PPO

## 2020-04-14 DIAGNOSIS — R0609 Other forms of dyspnea: Secondary | ICD-10-CM

## 2020-04-14 DIAGNOSIS — R0600 Dyspnea, unspecified: Secondary | ICD-10-CM | POA: Diagnosis not present

## 2020-04-14 MED ORDER — PERFLUTREN LIPID MICROSPHERE
1.0000 mL | INTRAVENOUS | Status: AC | PRN
  Administered 2020-04-14: 4 mL via INTRAVENOUS

## 2020-04-15 LAB — ECHOCARDIOGRAM COMPLETE
Area-P 1/2: 2.5 cm2
Calc EF: 69.7 %
Single Plane A2C EF: 72.3 %
Single Plane A4C EF: 65.7 %

## 2020-04-21 ENCOUNTER — Ambulatory Visit: Payer: BC Managed Care – PPO | Admitting: Cardiology

## 2020-04-28 ENCOUNTER — Other Ambulatory Visit: Payer: BC Managed Care – PPO

## 2020-04-28 DIAGNOSIS — Z20822 Contact with and (suspected) exposure to covid-19: Secondary | ICD-10-CM

## 2020-04-30 LAB — NOVEL CORONAVIRUS, NAA: SARS-CoV-2, NAA: NOT DETECTED

## 2020-04-30 LAB — SARS-COV-2, NAA 2 DAY TAT

## 2020-05-19 ENCOUNTER — Ambulatory Visit: Payer: BC Managed Care – PPO | Admitting: Family Medicine

## 2020-05-19 ENCOUNTER — Ambulatory Visit (INDEPENDENT_AMBULATORY_CARE_PROVIDER_SITE_OTHER): Payer: BC Managed Care – PPO

## 2020-05-19 ENCOUNTER — Other Ambulatory Visit: Payer: Self-pay

## 2020-05-19 DIAGNOSIS — Z23 Encounter for immunization: Secondary | ICD-10-CM | POA: Diagnosis not present

## 2020-05-22 ENCOUNTER — Ambulatory Visit: Payer: BC Managed Care – PPO | Admitting: Family Medicine

## 2020-06-23 ENCOUNTER — Telehealth: Payer: Self-pay | Admitting: Family Medicine

## 2020-06-23 ENCOUNTER — Ambulatory Visit: Payer: BC Managed Care – PPO | Admitting: Family Medicine

## 2020-06-23 NOTE — Telephone Encounter (Signed)
Received a DENIAL on PA for Saxenda continuation therapy.  She was started on Saxenda in 01/2020  Received approval for Saxenda - from 02/13/20 - 06/15/20.  However it is required to have office visit with documentation of her weight and progress in order to get PA approved.  She will need to schedule sooner apt if she prefers to restart [place] and we can resubmit a new PA.  Currently her apt is 08/12/19 - if she wants to wait and keep that apt that is fine.  She is required to have completed at least 16 weeks of treatment on Saxenda.  The target weight loss is at least 4 % of body weight on the medication.  If she meets the criteria of weight loss, we can submit new PA to get it approved.  Saralyn Pilar, DO [facility] 06/23/2020, 12:18 PM

## 2020-06-23 NOTE — Telephone Encounter (Signed)
Patient informed as per Dr Kirtland Bouchard she wants to wait till next year for her appointment.

## 2020-06-23 NOTE — Telephone Encounter (Signed)
Left message

## 2020-07-02 ENCOUNTER — Other Ambulatory Visit: Payer: BC Managed Care – PPO

## 2020-07-02 DIAGNOSIS — Z20822 Contact with and (suspected) exposure to covid-19: Secondary | ICD-10-CM

## 2020-07-03 LAB — SARS-COV-2, NAA 2 DAY TAT

## 2020-07-03 LAB — NOVEL CORONAVIRUS, NAA: SARS-CoV-2, NAA: NOT DETECTED

## 2020-07-04 ENCOUNTER — Telehealth (INDEPENDENT_AMBULATORY_CARE_PROVIDER_SITE_OTHER): Payer: BC Managed Care – PPO | Admitting: Family Medicine

## 2020-07-04 ENCOUNTER — Other Ambulatory Visit: Payer: Self-pay

## 2020-07-04 ENCOUNTER — Encounter: Payer: Self-pay | Admitting: Family Medicine

## 2020-07-04 DIAGNOSIS — J189 Pneumonia, unspecified organism: Secondary | ICD-10-CM

## 2020-07-04 MED ORDER — FLUTICASONE PROPIONATE 50 MCG/ACT NA SUSP: 2.0000 | Freq: Every day | NASAL | 3 refills | Status: DC

## 2020-07-04 MED ORDER — AZITHROMYCIN 250 MG PO TABS: ORAL_TABLET | ORAL | 0 refills | Status: DC

## 2020-07-04 MED ORDER — PREDNISONE 50 MG PO TABS: ORAL_TABLET | ORAL | 0 refills | Status: DC

## 2020-07-04 NOTE — Assessment & Plan Note (Signed)
Treating for probable pneumonia based on symptoms and history of rapid progression from URI to pneumonia in the past.  Will start on prednisone 50mg  daily x 5 days, azithromycin as directed, to continue flonase, and mucinex according to packaging directions.  Strict ER precautions.  RTC PRN

## 2020-07-04 NOTE — Progress Notes (Signed)
Virtual Visit via Telephone  The purpose of this virtual visit is to provide medical care while limiting exposure to the novel coronavirus (COVID19) for both patient and office staff.  Consent was obtained for phone visit:  Yes.   Answered questions that patient had about telehealth interaction:  Yes.   I discussed the limitations, risks, security and privacy concerns of performing an evaluation and management service by telephone. I also discussed with the patient that there may be a patient responsible charge related to this service. The patient expressed understanding and agreed to proceed.  Patient is at home and is accessed via telephone Services are provided by Charlaine Dalton, FNP-C from [facility])  ---------------------------------------------------------------------- Chief Complaint  Patient presents with  . Cough    Coughing, productive coughing w/ greenish yellowish thick phlegm, sore throat,SOB breath, cold chills, runny nose w/ greenish mucus. X 3 days  negative COVID test on yesterday. She is currentlly taking Mucinex, Ibuprofen to treat symptoms     S: Reviewed CMA documentation. I have called patient and gathered additional HPI as follows:  Ms. Vallas presents for virtual telemedicine visit via telephone with concerns of productive cough with thick green/yellow phlegm, sore throat, shortness of breath, feeling feverish with chills, runny nose with green drainage x 3 days.  Reports having had a COVID test on Wednesday that resulted negative yesterday.  Has been taking mucinex and ibuprofen to treat symptoms with some improvement while on mucinex.  Reports history of having symptoms in the past that rapidly progress to pneumonia and hospitalization.  Has recently been to a concern without a mask last Friday evening.  Is unsure if there were any other contacts near her that were sick.  Patient is currently home Denies any high risk travel to areas of  current concern for COVID19. Denies any known or suspected exposure to person with or possibly with COVID19.  Past Medical History:  Diagnosis Date  . GERD (gastroesophageal reflux disease)   . IBS (irritable bowel syndrome)   . Obesity    Social History   Tobacco Use  . Smoking status: Never Smoker  . Smokeless tobacco: Never Used  Substance Use Topics  . Alcohol use: No    Alcohol/week: 0.0 standard drinks  . Drug use: No    Current Outpatient Medications:  .  BLACK COHOSH EXTRACT PO, Take daily by mouth., Disp: , Rfl:  .  Cholecalciferol (VITAMIN D3) 50 MCG (2000 UT) TABS, Take 2,000 Units by mouth daily., Disp: , Rfl:  .  cyclobenzaprine (FLEXERIL) 10 MG tablet, Take 0.5-1 tablets (5-10 mg total) by mouth 3 (three) times daily as needed for muscle spasms. Recommend start at night only, Disp: 30 tablet, Rfl: 2 .  furosemide (LASIX) 20 MG tablet, Take 1 tablet (20 mg total) by mouth daily., Disp: 30 tablet, Rfl: 0 .  hydrOXYzine (ATARAX/VISTARIL) 25 MG tablet, Take 1-2 tablets (25-50 mg total) by mouth at bedtime as needed for anxiety (insomnia)., Disp: 30 tablet, Rfl: 0 .  loratadine (CLARITIN) 10 MG tablet, Take 1 tablet (10 mg total) by mouth daily. Use for 4-6 weeks then stop, and use as needed or seasonally, Disp: 30 tablet, Rfl: 11 .  metFORMIN (GLUCOPHAGE-XR) 500 MG 24 hr tablet, TAKE 1 TABLET BY MOUTH ONCE DAILY WITH BREAKFAST, Disp: 90 tablet, Rfl: 3 .  omeprazole (PRILOSEC) 20 MG capsule, Take 1 capsule (20 mg total) by mouth 2 (two) times daily before a meal., Disp: 180 capsule, Rfl: 1 .  PARoxetine (PAXIL) 20 MG tablet, TAKE 1 TABLET BY MOUTH ONCE DAILY, Disp: 90 tablet, Rfl: 1 .  rOPINIRole (REQUIP) 0.25 MG tablet, TAKE 1 OR 2 TABLETS BY MOUTH AT BEDTIME AS NEEDED (RESTLESS LEGS), Disp: 180 tablet, Rfl: 1 .  azithromycin (ZITHROMAX) 250 MG tablet, Take 2 tablets today, followed by 1 tablet daily x 4 days, Disp: 6 each, Rfl: 0 .  fluticasone (FLONASE) 50 MCG/ACT nasal  spray, Place 2 sprays into both nostrils daily. Use for 4-6 weeks then stop and use seasonally or as needed., Disp: 16 g, Rfl: 3 .  predniSONE (DELTASONE) 50 MG tablet, Take 1 tablet daily x 5 days, Disp: 5 tablet, Rfl: 0 .  SAXENDA 18 MG/3ML SOPN, Initial: 0.6 mg once daily for 1 week; increase by 0.6 mg daily at weekly intervals to a target dose of 3 mg once daily (Patient not taking: Reported on 07/04/2020), Disp: 3 mL, Rfl: 2  Depression screen [facility] 2/9 02/11/2020 11/06/2018 07/05/2018  Decreased Interest 0 0 0  Down, Depressed, Hopeless 0 0 0  PHQ - 2 Score 0 0 0  Altered sleeping - - -  Tired, decreased energy - - -  Change in appetite - - -  Feeling bad or failure about yourself  - - -  Trouble concentrating - - -  Moving slowly or fidgety/restless - - -  Suicidal thoughts - - -  PHQ-9 Score - - -  Difficult doing work/chores - - -    GAD 7 : Generalized Anxiety Score 11/06/2018 11/28/2017  Nervous, Anxious, on Edge 0 3  Control/stop worrying 0 2  Worry too much - different things 0 2  Trouble relaxing 0 2  Restless 0 2  Easily annoyed or irritable 0 1  Afraid - awful might happen 0 0  Total GAD 7 Score 0 12  Anxiety Difficulty Somewhat difficult Not difficult at all    -------------------------------------------------------------------------- O: No physical exam performed due to remote telephone encounter.  Physical Exam: Patient remotely monitored without video.  Verbal communication appropriate.  Cognition normal.  Recent Results (from the past 2160 hour(s))  ECHOCARDIOGRAM COMPLETE     Status: None   Collection Time: 04/14/20  9:32 AM  Result Value Ref Range   Area-P 1/2 2.50 cm2   Single Plane A4C EF 65.7 %   Single Plane A2C EF 72.3 %   Calc EF 69.7 %  Novel Coronavirus, NAA ([facility])     Status: None   Collection Time: 04/28/20  1:19 PM   Specimen: Nasopharyngeal(NP) swabs in vial transport medium   Nasopharynge  Screenin  Result Value Ref Range    SARS-CoV-2, NAA Not Detected Not Detected    Comment: This nucleic acid amplification test was developed and its performance characteristics determined by [employer]. Nucleic acid amplification tests include RT-PCR and TMA. This test has not been FDA cleared or approved. This test has been authorized by FDA under an Emergency Use Authorization (EUA). This test is only authorized for the duration of time the declaration that circumstances exist justifying the authorization of the emergency use of in vitro diagnostic tests for detection of SARS-CoV-2 virus and/or diagnosis of COVID-19 infection under section 564(b)(1) of the Act, 21 U.S.C. 161WRU-0(A) (1), unless the authorization is terminated or revoked sooner. When diagnostic testing is negative, the possibility of a false negative result should be considered in the context of a patient's recent exposures and the presence of clinical signs and symptoms consistent with COVID-19. An individual without  symptoms of COVID-19 and who is not shedding SARS-CoV-2 virus wo uld expect to have a negative (not detected) result in this assay.   SARS-COV-2, NAA 2 DAY TAT     Status: None   Collection Time: 04/28/20  1:19 PM   Nasopharynge  Screenin  Result Value Ref Range   SARS-CoV-2, NAA 2 DAY TAT Performed   Novel Coronavirus, NAA ([facility])     Status: None   Collection Time: 07/02/20  5:50 PM   Specimen: Nasopharyngeal(NP) swabs in vial transport medium   Nasopharynge  Result Value Ref Range   SARS-CoV-2, NAA Not Detected Not Detected    Comment: This nucleic acid amplification test was developed and its performance characteristics determined by [employer]. Nucleic acid amplification tests include RT-PCR and TMA. This test has not been FDA cleared or approved. This test has been authorized by FDA under an Emergency Use Authorization (EUA). This test is only authorized for the duration of time the declaration that  circumstances exist justifying the authorization of the emergency use of in vitro diagnostic tests for detection of SARS-CoV-2 virus and/or diagnosis of COVID-19 infection under section 564(b)(1) of the Act, 21 U.S.C. 194RDE-0(C) (1), unless the authorization is terminated or revoked sooner. When diagnostic testing is negative, the possibility of a false negative result should be considered in the context of a patient's recent exposures and the presence of clinical signs and symptoms consistent with COVID-19. An individual without symptoms of COVID-19 and who is not shedding SARS-CoV-2 virus wo uld expect to have a negative (not detected) result in this assay.   SARS-COV-2, NAA 2 DAY TAT     Status: None   Collection Time: 07/02/20  5:50 PM   Nasopharynge  Result Value Ref Range   SARS-CoV-2, NAA 2 DAY TAT Performed     -------------------------------------------------------------------------- A&P:  Problem List Items Addressed This Visit      Respiratory   Pneumonia due to infectious organism - Primary    Treating for probable pneumonia based on symptoms and history of rapid progression from URI to pneumonia in the past.  Will start on prednisone 50mg  daily x 5 days, azithromycin as directed, to continue flonase, and mucinex according to packaging directions.  Strict ER precautions.  RTC PRN      Relevant Medications   fluticasone (FLONASE) 50 MCG/ACT nasal spray   predniSONE (DELTASONE) 50 MG tablet   azithromycin (ZITHROMAX) 250 MG tablet      Meds ordered this encounter  Medications  . fluticasone (FLONASE) 50 MCG/ACT nasal spray    Sig: Place 2 sprays into both nostrils daily. Use for 4-6 weeks then stop and use seasonally or as needed.    Dispense:  16 g    Refill:  3  . predniSONE (DELTASONE) 50 MG tablet    Sig: Take 1 tablet daily x 5 days    Dispense:  5 tablet    Refill:  0  . azithromycin (ZITHROMAX) 250 MG tablet    Sig: Take 2 tablets today, followed by 1  tablet daily x 4 days    Dispense:  6 each    Refill:  0    Follow-up: - Return if symptoms worsen or fail to improve - Strict ER precautions  Patient verbalizes understanding with the above medical recommendations including the limitation of remote medical advice.  Specific follow-up and call-back criteria were given for patient to follow-up or seek medical care more urgently if needed.  - Time spent in direct consultation  with patient on phone: 8 minutes  Charlaine DaltonNicole Marie Noe Pittsley, FNP-C [facility] 07/04/2020, 11:35 AM

## 2020-07-08 ENCOUNTER — Other Ambulatory Visit: Payer: Self-pay | Admitting: Family Medicine

## 2020-07-08 DIAGNOSIS — J189 Pneumonia, unspecified organism: Secondary | ICD-10-CM

## 2020-07-08 MED ORDER — LEVOFLOXACIN 500 MG PO TABS: 500.0000 mg | ORAL_TABLET | Freq: Every day | ORAL | 0 refills | Status: DC

## 2020-07-16 ENCOUNTER — Other Ambulatory Visit: Payer: BC Managed Care – PPO

## 2020-07-16 ENCOUNTER — Other Ambulatory Visit: Payer: Self-pay

## 2020-07-16 DIAGNOSIS — Z20822 Contact with and (suspected) exposure to covid-19: Secondary | ICD-10-CM

## 2020-07-17 LAB — SARS-COV-2, NAA 2 DAY TAT

## 2020-07-17 LAB — NOVEL CORONAVIRUS, NAA: SARS-CoV-2, NAA: NOT DETECTED

## 2020-07-22 ENCOUNTER — Other Ambulatory Visit: Payer: Self-pay

## 2020-07-22 DIAGNOSIS — Z20822 Contact with and (suspected) exposure to covid-19: Secondary | ICD-10-CM

## 2020-07-24 LAB — SPECIMEN STATUS REPORT

## 2020-07-24 LAB — SARS-COV-2, NAA 2 DAY TAT

## 2020-07-24 LAB — NOVEL CORONAVIRUS, NAA: SARS-CoV-2, NAA: NOT DETECTED

## 2020-07-26 ENCOUNTER — Other Ambulatory Visit: Payer: Self-pay

## 2020-07-26 DIAGNOSIS — Z20822 Contact with and (suspected) exposure to covid-19: Secondary | ICD-10-CM

## 2020-07-28 ENCOUNTER — Other Ambulatory Visit: Payer: Self-pay

## 2020-07-28 ENCOUNTER — Ambulatory Visit
Admission: RE | Admit: 2020-07-28 | Discharge: 2020-07-28 | Disposition: A | Discharge status: Home / Self Care | Payer: BC Managed Care – PPO | Source: Ambulatory Visit | Attending: Family Medicine | Admitting: Family Medicine

## 2020-07-28 DIAGNOSIS — Z1231 Encounter for screening mammogram for malignant neoplasm of breast: Secondary | ICD-10-CM | POA: Diagnosis not present

## 2020-07-29 ENCOUNTER — Other Ambulatory Visit: Payer: Self-pay | Admitting: Family Medicine

## 2020-07-29 DIAGNOSIS — R7303 Prediabetes: Secondary | ICD-10-CM

## 2020-07-29 DIAGNOSIS — N951 Menopausal and female climacteric states: Secondary | ICD-10-CM

## 2020-07-29 LAB — NOVEL CORONAVIRUS, NAA: SARS-CoV-2, NAA: NOT DETECTED

## 2020-08-11 ENCOUNTER — Ambulatory Visit: Payer: BC Managed Care – PPO | Admitting: Family Medicine

## 2020-09-02 ENCOUNTER — Other Ambulatory Visit: Payer: Self-pay | Admitting: Family Medicine

## 2020-09-02 DIAGNOSIS — K219 Gastro-esophageal reflux disease without esophagitis: Secondary | ICD-10-CM

## 2020-09-15 ENCOUNTER — Other Ambulatory Visit: Payer: Self-pay | Admitting: Family Medicine

## 2020-09-15 DIAGNOSIS — G2581 Restless legs syndrome: Secondary | ICD-10-CM

## 2020-11-06 ENCOUNTER — Other Ambulatory Visit: Payer: Self-pay | Admitting: Family Medicine

## 2020-11-06 DIAGNOSIS — N951 Menopausal and female climacteric states: Secondary | ICD-10-CM

## 2020-11-13 ENCOUNTER — Other Ambulatory Visit: Payer: Self-pay | Admitting: Family Medicine

## 2020-11-13 DIAGNOSIS — R7303 Prediabetes: Secondary | ICD-10-CM

## 2020-11-13 NOTE — Telephone Encounter (Signed)
Requested medication (s) are due for refill today - yes  Requested medication (s) are on the active medication list -yes  Future visit scheduled -no  Last refill: 07/29/20  Notes to clinic: Patient fails lab protocol- refill request sent for review- may need appointment  Requested Prescriptions  Pending Prescriptions Disp Refills   metFORMIN (GLUCOPHAGE-XR) 500 MG 24 hr tablet [Pharmacy Med Name: METFORMIN HCL ER 500 MG TAB] 90 tablet 0    Sig: TAKE 1 TABLET BY MOUTH ONCE DAILY WITH BREAKFAST      Endocrinology:  Diabetes - Biguanides Failed - 11/13/2020  3:41 PM      Failed - Cr in normal range and within 360 days    Creat  Date Value Ref Range Status  08/30/2019 0.98 0.50 - 0.99 mg/dL Final    Comment:    For patients >38 years of age, the reference limit for Creatinine is approximately 13% higher for people identified as African-American. .           Failed - HBA1C is between 0 and 7.9 and within 180 days    Hemoglobin A1C  Date Value Ref Range Status  02/11/2020 6.4 (A) 4.0 - 5.6 % Final   Hgb A1c MFr Bld  Date Value Ref Range Status  08/30/2019 6.4 (H) <5.7 % of total Hgb Final    Comment:    For someone without known diabetes, a hemoglobin  A1c value between 5.7% and 6.4% is consistent with prediabetes and should be confirmed with a  follow-up test. . For someone with known diabetes, a value <7% indicates that their diabetes is well controlled. A1c targets should be individualized based on duration of diabetes, age, comorbid conditions, and other considerations. . This assay result is consistent with an increased risk of diabetes. . Currently, no consensus exists regarding use of hemoglobin A1c for diagnosis of diabetes for children. .           Failed - eGFR in normal range and within 360 days    GFR, Est African American  Date Value Ref Range Status  11/03/2018 68 > OR = 60 mL/min/1.63m Final   GFR, Est Non African American  Date Value Ref  Range Status  11/03/2018 59 (L) > OR = 60 mL/min/1.711mFinal          Passed - Valid encounter within last 6 months    Recent Outpatient Visits           4 months ago Pneumonia due to infectious organism, unspecified laterality, unspecified part of lung   [facility]   7 months ago Acute bacterial conjunctivitis of left eye   [facility]   9 months ago Prediabetes   [place]   1 year ago Annual physical exam   [facility]   2 years ago Prediabetes   [place]                    Requested Prescriptions  Pending Prescriptions Disp Refills   metFORMIN (GLUCOPHAGE-XR) 500 MG 24 hr tablet [Pharmacy Med Name: METFORMIN HCL ER 500 MG TAB] 90 tablet 0    Sig: TAKE 1 TABLET BY MOUTH ONCE DAILY WITH BREAKFAST      Endocrinology:  Diabetes - Biguanides Failed - 11/13/2020  3:41 PM      Failed -  Cr in normal range and within 360 days    Creat  Date Value Ref Range Status  08/30/2019 0.98 0.50 - 0.99 mg/dL Final    Comment:    For patients >29 years of age, the reference limit for Creatinine is approximately 13% higher for people identified as African-American. .           Failed - HBA1C is between 0 and 7.9 and within 180 days    Hemoglobin A1C  Date Value Ref Range Status  02/11/2020 6.4 (A) 4.0 - 5.6 % Final   Hgb A1c MFr Bld  Date Value Ref Range Status  08/30/2019 6.4 (H) <5.7 % of total Hgb Final    Comment:    For someone without known diabetes, a hemoglobin  A1c value between 5.7% and 6.4% is consistent with prediabetes and should be confirmed with a  follow-up test. . For someone with known diabetes, a value <7% indicates that their diabetes is well controlled. A1c targets should be individualized based on duration of diabetes, age,  comorbid conditions, and other considerations. . This assay result is consistent with an increased risk of diabetes. . Currently, no consensus exists regarding use of hemoglobin A1c for diagnosis of diabetes for children. .           Failed - eGFR in normal range and within 360 days    GFR, Est African American  Date Value Ref Range Status  11/03/2018 68 > OR = 60 mL/min/1.91m Final   GFR, Est Non African American  Date Value Ref Range Status  11/03/2018 59 (L) > OR = 60 mL/min/1.781mFinal          Passed - Valid encounter within last 6 months    Recent Outpatient Visits           4 months ago Pneumonia due to infectious organism, unspecified laterality, unspecified part of lung   [place] [place]   7 months ago Acute bacterial conjunctivitis of left eye   [facility]   9 months ago Prediabetes   [facility]   1 year ago Annual physical exam   [facility]   2 years ago Prediabetes   [place]

## 2021-01-22 ENCOUNTER — Other Ambulatory Visit: Payer: Self-pay | Admitting: Family Medicine

## 2021-01-22 DIAGNOSIS — G2581 Restless legs syndrome: Secondary | ICD-10-CM

## 2021-01-22 DIAGNOSIS — N951 Menopausal and female climacteric states: Secondary | ICD-10-CM

## 2021-01-22 NOTE — Telephone Encounter (Signed)
Requested medication (s) are due for refill today: yes  Requested medication (s) are on the active medication list: yes  Last refill:  11/06/2020  Future visit scheduled: yes   Notes to clinic:  overdue for 6 month follow up Vm left for patient to contact office    Requested Prescriptions  Pending Prescriptions Disp Refills   PARoxetine (PAXIL) 20 MG tablet [Pharmacy Med Name: PAROXETINE HCL 20 MG TAB] 90 tablet 0    Sig: TAKE 1 TABLET BY MOUTH ONCE DAILY      Psychiatry:  Antidepressants - SSRI Failed - 01/22/2021 11:37 AM      Failed - Valid encounter within last 6 months    Recent Outpatient Visits           6 months ago Pneumonia due to infectious organism, unspecified laterality, unspecified part of lung   [facility], Jodelle Gross, FNP   10 months ago Acute bacterial conjunctivitis of left eye   [facility] Smitty Cords, DO   11 months ago Prediabetes   [facility] Althea Charon, Netta Neat, DO   1 year ago [facility] physical exam   [facility]   2 years ago Prediabetes   [facility] [place], Netta Neat, [place]

## 2021-05-08 ENCOUNTER — Encounter: Payer: Self-pay | Admitting: Family Medicine

## 2021-05-08 ENCOUNTER — Ambulatory Visit (INDEPENDENT_AMBULATORY_CARE_PROVIDER_SITE_OTHER): Payer: BC Managed Care – PPO | Admitting: Family Medicine

## 2021-05-08 ENCOUNTER — Ambulatory Visit: Payer: Self-pay | Admitting: *Deleted

## 2021-05-08 DIAGNOSIS — N3000 Acute cystitis without hematuria: Secondary | ICD-10-CM

## 2021-05-08 MED ORDER — CEPHALEXIN 500 MG PO CAPS: 500.0000 mg | ORAL_CAPSULE | Freq: Three times a day (TID) | ORAL | 0 refills | Status: DC

## 2021-05-08 NOTE — Telephone Encounter (Signed)
Summary: Possible UTI   No appt available until Monday and pt cannot wait she says, needs assistance today   ----- Message from Randol Kern sent at 05/08/2021 11:35 AM EDT -----  Pt has potential UTI symptoms, experiencing pain and high urination frequency  Best contact: [phone]     Attempted to call patient- left message to call office

## 2021-05-08 NOTE — Patient Instructions (Addendum)
1. You have a Urinary Tract Infection - this is very common, your symptoms are reassuring and you should get better within 1 week on the antibiotics - Start Keflex 500mg  3 times daily for next 7 days, complete entire course, even if feeling better  IF NOT IMPROVING come by next week for urine for a culture, we will call you within next few days if we need to change antibiotics  - Please drink plenty of fluids, improve hydration over next 1 week  If symptoms worsening, developing nausea / vomiting, worsening back pain, fevers / chills / sweats, then please return for re-evaluation sooner.  If you take AZO OTC - limit this to 2-3 days MAX to avoid affecting kidneys  D-Mannose is a natural supplement that can actually help bind to urinary bacteria and reduce their effectiveness it can help prevent UTI from forming, and may reduce some symptoms. It likely cannot cure an active UTI but it is worth a try and good to prevent them with. Try 500mg  twice a day at a full dose if you want, or check package instructions for more info   Please schedule a Follow-up Appointment to: Return if symptoms worsen or fail to improve.  If you have any other questions or concerns, please feel free to call the office or send a message through MyChart. You may also schedule an earlier appointment if necessary.  Additionally, you may be receiving a survey about your experience at our office within a few days to 1 week by e-mail or mail. We value your feedback.  , DO [facility], Saralyn Pilar

## 2021-05-08 NOTE — Telephone Encounter (Signed)
Patient had Virtual visit today with Dr. Althea Charon, will close out this triage encounter.

## 2021-05-08 NOTE — Progress Notes (Signed)
Virtual Visit via Telephone The purpose of this virtual visit is to provide medical care while limiting exposure to the novel coronavirus (COVID19) for both patient and office staff.  Consent was obtained for phone visit:  Yes.   Answered questions that patient had about telehealth interaction:  Yes.   I discussed the limitations, risks, security and privacy concerns of performing an evaluation and management service by telephone. I also discussed with the patient that there may be a patient responsible charge related to this service. The patient expressed understanding and agreed to proceed.  Patient Location: Home Provider Location: [facility] (Office)  Participants in virtual visit: - Patient: Ashley Hale - CMA: Burnell Blanks, CMA - Provider: Dr Althea Charon  ---------------------------------------------------------------------- No chief complaint on file.   S: Reviewed CMA documentation. I have called patient and gathered additional HPI as follows:  UTI / Nocturia Reports that symptoms started with urinary frequency, incomplete empty, dysuria, and nocturia recently. Similar to prior UTI. Not drinking enough water Tried OTC AZO  Denies any fevers, chills, sweats, body ache, cough, shortness of breath, sinus pain or pressure, headache, abdominal pain, diarrhea  Past Medical History:  Diagnosis Date   GERD (gastroesophageal reflux disease)    IBS (irritable bowel syndrome)    Obesity    Social History   Tobacco Use   Smoking status: Never   Smokeless tobacco: Never  Substance Use Topics   Alcohol use: No    Alcohol/week: 0.0 standard drinks   Drug use: No    Current Outpatient Medications:    cephALEXin (KEFLEX) 500 MG capsule, Take 1 capsule (500 mg total) by mouth 3 (three) times daily. For 7 days, Disp: 21 capsule, Rfl: 0   BLACK COHOSH EXTRACT PO, Take daily by mouth., Disp: , Rfl:    Cholecalciferol (VITAMIN D3) 50 MCG (2000 UT) TABS,  Take 2,000 Units by mouth daily., Disp: , Rfl:    cyclobenzaprine (FLEXERIL) 10 MG tablet, Take 0.5-1 tablets (5-10 mg total) by mouth 3 (three) times daily as needed for muscle spasms. Recommend start at night only, Disp: 30 tablet, Rfl: 2   fluticasone (FLONASE) 50 MCG/ACT nasal spray, Place 2 sprays into both nostrils daily. Use for 4-6 weeks then stop and use seasonally or as needed., Disp: 16 g, Rfl: 3   furosemide (LASIX) 20 MG tablet, Take 1 tablet (20 mg total) by mouth daily., Disp: 30 tablet, Rfl: 0   hydrOXYzine (ATARAX/VISTARIL) 25 MG tablet, Take 1-2 tablets (25-50 mg total) by mouth at bedtime as needed for anxiety (insomnia)., Disp: 30 tablet, Rfl: 0   loratadine (CLARITIN) 10 MG tablet, Take 1 tablet (10 mg total) by mouth daily. Use for 4-6 weeks then stop, and use as needed or seasonally, Disp: 30 tablet, Rfl: 11   metFORMIN (GLUCOPHAGE-XR) 500 MG 24 hr tablet, TAKE 1 TABLET BY MOUTH ONCE DAILY WITH BREAKFAST, Disp: 90 tablet, Rfl: 1   omeprazole (PRILOSEC) 20 MG capsule, TAKE 1 CAPSULE BY MOUTH TWICE DAILY BEFORE A MEAL, Disp: 180 capsule, Rfl: 1   PARoxetine (PAXIL) 20 MG tablet, TAKE 1 TABLET BY MOUTH ONCE DAILY, Disp: 90 tablet, Rfl: 0   predniSONE (DELTASONE) 50 MG tablet, Take 1 tablet daily x 5 days, Disp: 5 tablet, Rfl: 0   rOPINIRole (REQUIP) 0.25 MG tablet, TAKE 1 OR 2 TABLETS BY MOUTH AT BEDTIME AS NEEDED RESTLESS LEGS, Disp: 180 tablet, Rfl: 0   SAXENDA 18 MG/3ML SOPN, Initial: 0.6 mg once daily for 1 week;  increase by 0.6 mg daily at weekly intervals to a target dose of 3 mg once daily (Patient not taking: Reported on 07/04/2020), Disp: 3 mL, Rfl: 2  Depression screen [facility] 2/9 02/11/2020 11/06/2018 07/05/2018  Decreased Interest 0 0 0  Down, Depressed, Hopeless 0 0 0  PHQ - 2 Score 0 0 0  Altered sleeping - - -  Tired, decreased energy - - -  Change in appetite - - -  Feeling bad or failure about yourself  - - -  Trouble concentrating - - -  Moving slowly or  fidgety/restless - - -  Suicidal thoughts - - -  PHQ-9 Score - - -  Difficult doing work/chores - - -    GAD 7 : Generalized Anxiety Score 11/06/2018 11/28/2017  Nervous, Anxious, on Edge 0 3  Control/stop worrying 0 2  Worry too much - different things 0 2  Trouble relaxing 0 2  Restless 0 2  Easily annoyed or irritable 0 1  Afraid - awful might happen 0 0  Total GAD 7 Score 0 12  Anxiety Difficulty Somewhat difficult Not difficult at all    -------------------------------------------------------------------------- O: No physical exam performed due to remote telephone encounter.  Lab results reviewed.  No results found for this or any previous visit (from the past 2160 hour(s)).  -------------------------------------------------------------------------- A&P:  Problem List Items Addressed This Visit   None Visit Diagnoses     Acute cystitis without hematuria    -  Primary   Relevant Medications   cephALEXin (KEFLEX) 500 MG capsule      Clinically consistent with UTI Virtual no urinalysis No recent UTIs or abx courses.  No concern for pyelo today (no systemic symptoms, neg fever, back pain, n/v).  Plan: 1. Keflex 500mg  TID x 7 days 2. Improve PO hydration RTC if no improvement 1-2 weeks, red flags given to return sooner  If not improve can check urine Mon/Tues next week, she is aware I am out of office end of next week, but we can follow up on her UTI if unresolved.   Meds ordered this encounter  Medications   cephALEXin (KEFLEX) 500 MG capsule    Sig: Take 1 capsule (500 mg total) by mouth 3 (three) times daily. For 7 days    Dispense:  21 capsule    Refill:  0    Follow-up: - Return in 1 week PRN  Patient verbalizes understanding with the above medical recommendations including the limitation of remote medical advice.  Specific follow-up and call-back criteria were given for patient to follow-up or seek medical care more urgently if needed.   - Time  spent in direct consultation with patient on phone: 7 minutes   , DO [facility] 05/08/2021, 11:45 AM

## 2021-06-10 ENCOUNTER — Other Ambulatory Visit: Payer: Self-pay | Admitting: Family Medicine

## 2021-06-10 DIAGNOSIS — G2581 Restless legs syndrome: Secondary | ICD-10-CM

## 2021-06-10 NOTE — Telephone Encounter (Signed)
Requested Prescriptions  Pending Prescriptions Disp Refills  . rOPINIRole (REQUIP) 0.25 MG tablet [Pharmacy Med Name: ROPINIROLE HCL 0.25 MG TAB] 180 tablet 0    Sig: TAKE 1 OR 2 TABLETS BY MOUTH AT BEDTIME AS NEEDED RESTLESS LEGS     Neurology:  Parkinsonian Agents Passed - 06/10/2021  2:30 PM      Passed - Last BP in normal range    BP Readings from Last 1 Encounters:  03/17/20 118/88         Passed - Valid encounter within last 12 months    Recent Outpatient Visits          1 month ago Acute cystitis without hematuria   [facility] Smitty Cords, DO   11 months ago Pneumonia due to infectious organism, unspecified laterality, unspecified part of lung   [facility], Jodelle Gross, FNP   1 year ago Acute bacterial conjunctivitis of left eye   [facility] Smitty Cords, DO   1 year ago Prediabetes   [facility] Althea Charon, Netta Neat, DO   1 year ago Annual physical exam   [facility] [place], Netta Neat, DO

## 2021-07-21 ENCOUNTER — Ambulatory Visit (INDEPENDENT_AMBULATORY_CARE_PROVIDER_SITE_OTHER): Payer: BC Managed Care – PPO

## 2021-07-21 ENCOUNTER — Ambulatory Visit: Payer: BC Managed Care – PPO | Admitting: Podiatry

## 2021-07-21 ENCOUNTER — Encounter: Payer: Self-pay | Admitting: Podiatry

## 2021-07-21 ENCOUNTER — Other Ambulatory Visit: Payer: Self-pay

## 2021-07-21 DIAGNOSIS — S99921A Unspecified injury of right foot, initial encounter: Secondary | ICD-10-CM | POA: Diagnosis not present

## 2021-07-21 DIAGNOSIS — S92501A Displaced unspecified fracture of right lesser toe(s), initial encounter for closed fracture: Secondary | ICD-10-CM

## 2021-07-21 MED ORDER — DOXYCYCLINE HYCLATE 100 MG PO TABS: 100.0000 mg | ORAL_TABLET | Freq: Two times a day (BID) | ORAL | 0 refills | Status: DC

## 2021-07-27 ENCOUNTER — Other Ambulatory Visit: Payer: Self-pay

## 2021-07-27 ENCOUNTER — Other Ambulatory Visit: Payer: Self-pay | Admitting: Family Medicine

## 2021-07-27 ENCOUNTER — Encounter: Payer: Self-pay | Admitting: Family Medicine

## 2021-07-27 ENCOUNTER — Ambulatory Visit: Payer: BC Managed Care – PPO | Admitting: Family Medicine

## 2021-07-27 VITALS — BP 148/80 | HR 77 | Ht 69.0 in | Wt 266.2 lb

## 2021-07-27 DIAGNOSIS — H6983 Other specified disorders of Eustachian tube, bilateral: Secondary | ICD-10-CM | POA: Diagnosis not present

## 2021-07-27 DIAGNOSIS — R7303 Prediabetes: Secondary | ICD-10-CM

## 2021-07-27 DIAGNOSIS — H8111 Benign paroxysmal vertigo, right ear: Secondary | ICD-10-CM | POA: Diagnosis not present

## 2021-07-27 DIAGNOSIS — H6993 Unspecified Eustachian tube disorder, bilateral: Secondary | ICD-10-CM

## 2021-07-27 MED ORDER — MECLIZINE HCL 25 MG PO TABS: 25.0000 mg | ORAL_TABLET | Freq: Three times a day (TID) | ORAL | 0 refills | Status: DC | PRN

## 2021-07-27 MED ORDER — FLUTICASONE PROPIONATE 50 MCG/ACT NA SUSP: 2.0000 | Freq: Every day | NASAL | 3 refills | Status: AC

## 2021-07-27 NOTE — Patient Instructions (Addendum)
Thank you for coming to the office today.  1. You have symptoms of Vertigo (Benign Paroxysmal Positional Vertigo) - This is commonly caused by inner ear fluid imbalance, sometimes can be worsened by allergies and sinus symptoms, otherwise it can occur randomly sometimes and we may never discover the exact cause. - To treat this, try the Epley Manuever (see diagrams/instructions below) at home up to 3 times a day for 1-2 weeks or until symptoms resolve - You may take Meclizine as needed up to 3 times a day for dizziness, this will not cure symptoms but may help. Caution may make you drowsy.  Start nasal steroid Flonase 2 sprays in each nostril daily for 4-6 weeks, may repeat course seasonally or as needed   If you develop significant worsening episode with vertigo that does not improve and you get severe headache, loss of vision, arm or leg weakness, slurred speech, or other concerning symptoms please seek immediate medical attention at Emergency Department.  ----------------------------------  Check into options for weight management  Medication - Contrave (this med includes Wellbutrin) so sometimes if not covered or high cost we can do generic wellbutrin instead.  WEIGHT MANAGEMENT  Dr Quillian Quince  [facility] [address] [place], [place] [address] Ph: [phone]  --------------  By all means, check into your current network through [facility] Or wherever, and see if they can locate other Weight Management Center.   Please schedule a follow-up appointment with Dr Althea Charon within 4 weeks if Vertigo not improving, and will consider Referral to Vestibular Rehab    See the next page for images describing the Epley Manuever.     ----------------------------------------------------------------------------------------------------------------------       Please schedule a Follow-up Appointment to: Return if symptoms worsen or fail to  improve.  If you have any other questions or concerns, please feel free to call the office or send a message through MyChart. You may also schedule an earlier appointment if necessary.  Additionally, you may be receiving a survey about your experience at our office within a few days to 1 week by e-mail or mail. We value your feedback.  Saralyn Pilar, DO [facility] [place], [place]

## 2021-07-27 NOTE — Telephone Encounter (Signed)
Requested medications are due for refill today.  yes  Requested medications are on the active medications list.  yes  Last refill. 11/14/2020  Future visit scheduled.   no  Notes to clinic.  Failed protocol d/t expired labs.    Requested Prescriptions  Pending Prescriptions Disp Refills   metFORMIN (GLUCOPHAGE-XR) 500 MG 24 hr tablet [Pharmacy Med Name: METFORMIN HCL ER 500 MG TAB] 90 tablet 1    Sig: TAKE 1 TABLET BY MOUTH ONCE DAILY WITH BREAKFAST     Endocrinology:  Diabetes - Biguanides Failed - 07/27/2021 10:02 AM      Failed - Cr in normal range and within 360 days    Creat  Date Value Ref Range Status  08/30/2019 0.98 0.50 - 0.99 mg/dL Final    Comment:    For patients >60 years of age, the reference limit for Creatinine is approximately 13% higher for people identified as African-American. .           Failed - HBA1C is between 0 and 7.9 and within 180 days    Hemoglobin A1C  Date Value Ref Range Status  02/11/2020 6.4 (A) 4.0 - 5.6 % Final   Hgb A1c MFr Bld  Date Value Ref Range Status  08/30/2019 6.4 (H) <5.7 % of total Hgb Final    Comment:    For someone without known diabetes, a hemoglobin  A1c value between 5.7% and 6.4% is consistent with prediabetes and should be confirmed with a  follow-up test. . For someone with known diabetes, a value <7% indicates that their diabetes is well controlled. A1c targets should be individualized based on duration of diabetes, age, comorbid conditions, and other considerations. . This assay result is consistent with an increased risk of diabetes. . Currently, no consensus exists regarding use of hemoglobin A1c for diagnosis of diabetes for children. .           Failed - eGFR in normal range and within 360 days    GFR, Est African American  Date Value Ref Range Status  11/03/2018 68 > OR = 60 mL/min/1.67m Final   GFR, Est Non African American  Date Value Ref Range Status  11/03/2018 59 (L) > OR = 60  mL/min/1.782mFinal          Failed - Valid encounter within last 6 months    Recent Outpatient Visits           Today BPPV (benign paroxysmal positional vertigo), right   [place]   2 months ago Acute cystitis without hematuria   [place]   1 year ago Pneumonia due to infectious organism, unspecified laterality, unspecified part of lung   [facility]   1 year ago Acute bacterial conjunctivitis of left eye   [facility]   1 year ago Prediabetes   [facility]

## 2021-07-27 NOTE — Progress Notes (Signed)
Subjective:    Patient ID: Ashley Hale, female    DOB: August 11, 1960, 61 y.o.   MRN: [id number]  Ashley Hale is a 61 y.o. female presenting on 07/27/2021 for Dizziness   HPI  Vertigo, BPPV Eustachian Tube Dysfunction R>L Reports symptoms onset acutely within past 24 hours, earlier today noticed quick movement turned head to right, would get dizzy with room spinning sense of wall or room moving. No other triggers but has had sinus pressure and ear popping. No other sick symptoms acutely. No purulence or thicker discharge. No fevers. No prior vertigo history  Morbid obesity BMI >39 She is looking into options for weight management has had problem with obtaining saxenda and then had limited results. Interested to look into other meds or referral. She is no longer in [employer], says [employer] uses [place].  Depression screen [facility] 2/9 02/11/2020 11/06/2018 07/05/2018  Decreased Interest 0 0 0  Down, Depressed, Hopeless 0 0 0  PHQ - 2 Score 0 0 0  Altered sleeping - - -  Tired, decreased energy - - -  Change in appetite - - -  Feeling bad or failure about yourself  - - -  Trouble concentrating - - -  Moving slowly or fidgety/restless - - -  Suicidal thoughts - - -  PHQ-9 Score - - -  Difficult doing work/chores - - -    Social History   Tobacco Use   Smoking status: Never   Smokeless tobacco: Never  Substance Use Topics   Alcohol use: No    Alcohol/week: 0.0 standard drinks   Drug use: No    Review of Systems Per HPI unless specifically indicated above     Objective:    BP (!) 148/80    Pulse 77    Ht 5\' 9"  (1.753 m)    Wt 266 lb 3.2 oz (120.7 kg)    SpO2 100%    BMI 39.31 kg/m   Wt Readings from Last 3 Encounters:  07/27/21 266 lb 3.2 oz (120.7 kg)  03/17/20 262 lb 12.8 oz (119.2 kg)  02/11/20 (!) 268 lb (121.6 kg)    Physical Exam Vitals and nursing note reviewed.  Constitutional:      General: She is not in acute distress.    Appearance: Normal appearance. She  is well-developed. She is not diaphoretic.     Comments: Well-appearing, comfortable, cooperative  HENT:     Head: Normocephalic and atraumatic.     Right Ear: There is no impacted cerumen.     Left Ear: There is no impacted cerumen.     Ears:     Comments: R ear TM with clear effusion and L has mild effusion only Eyes:     General:        Right eye: No discharge.        Left eye: No discharge.     Conjunctiva/sclera: Conjunctivae normal.  Cardiovascular:     Rate and Rhythm: Normal rate.  Pulmonary:     Effort: Pulmonary effort is normal.  Skin:    General: Skin is warm and dry.     Findings: No erythema or rash.  Neurological:     Mental Status: She is alert and oriented to person, place, and time.  Psychiatric:        Mood and Affect: Mood normal.        Behavior: Behavior normal.        Thought Content: Thought content normal.  Comments: Well groomed, good eye contact, normal speech and thoughts   Results for orders placed or performed in visit on 07/26/20  Novel Coronavirus, NAA ([facility])   Specimen: Nasopharyngeal(NP) swabs in vial transport medium   Nasopharynge  Result Value Ref Range   SARS-CoV-2, NAA Not Detected Not Detected      Assessment & Plan:   Problem List Items Addressed This Visit     Morbid obesity (HCC)   Other Visit Diagnoses     BPPV (benign paroxysmal positional vertigo), right    -  Primary   Relevant Medications   meclizine (ANTIVERT) 25 MG tablet   Eustachian tube dysfunction, bilateral       Relevant Medications   fluticasone (FLONASE) 50 MCG/ACT nasal spray       Suspected R>L-BPPV by history R>L ear eustachian tube dysfunction effusion contributing No other significant neurological findings or focal deficits  Plan: 1. Handout given with Epley maneuver TID for 1-2 weeks until resolved 2. Rx meclizine PRN for breakthrough symptoms 3. Return criteria, if not improved consider vestibular PT referral  Additional discussion   Morbid obesity Reviewed options, if interested can pursue Contrave, gave her info in [facility], or we can consider wellbutrin as generic form for partial effect if need cannot get contrave. Unable to get saxenda due to cost, limited results on sample initially. She is interested in weight management clinic, offered [facility] in [place] but she needs [facility] network she will check where and let me know for referral if interested.   Meds ordered this encounter  Medications   meclizine (ANTIVERT) 25 MG tablet    Sig: Take 1 tablet (25 mg total) by mouth 3 (three) times daily as needed for dizziness.    Dispense:  30 tablet    Refill:  0   fluticasone (FLONASE) 50 MCG/ACT nasal spray    Sig: Place 2 sprays into both nostrils daily. Use for 4-6 weeks then stop and use seasonally or as needed.    Dispense:  16 g    Refill:  3      Follow up plan: Return if symptoms worsen or fail to improve.   Saralyn Pilar, DO [facility] 07/27/2021, 3:35 PM

## 2021-07-28 IMAGING — MG DIGITAL SCREENING BILAT W/ TOMO W/ CAD
8 series · 8 of 24 positions shown · non-contrast
Comparison: Previous exam(s).

CLINICAL DATA: Screening.

EXAM:
DIGITAL SCREENING BILATERAL MAMMOGRAM WITH TOMO AND CAD

[L CC synth-2D]
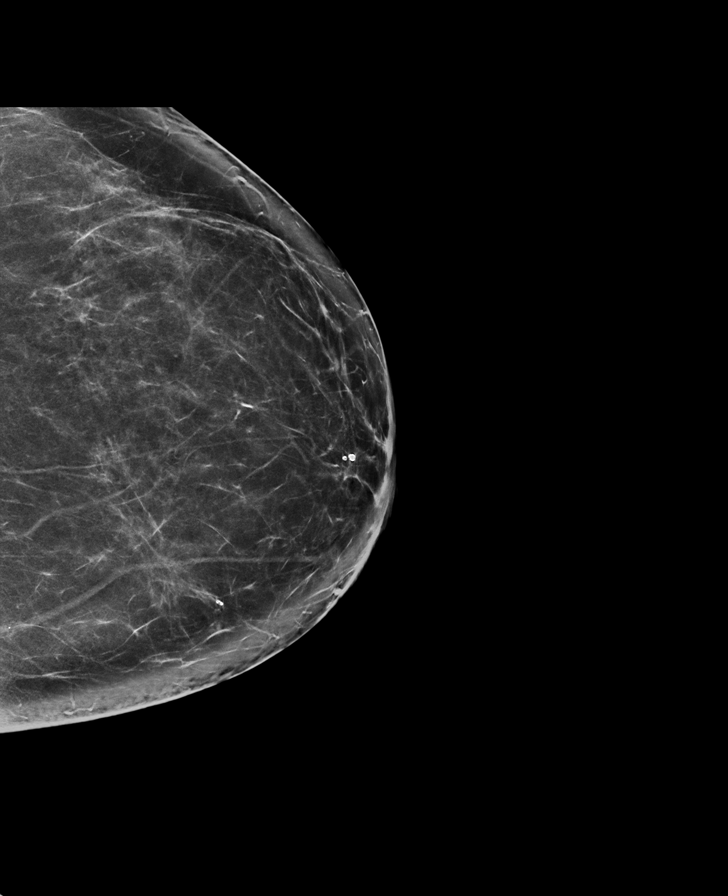

[L MLO synth-2D]
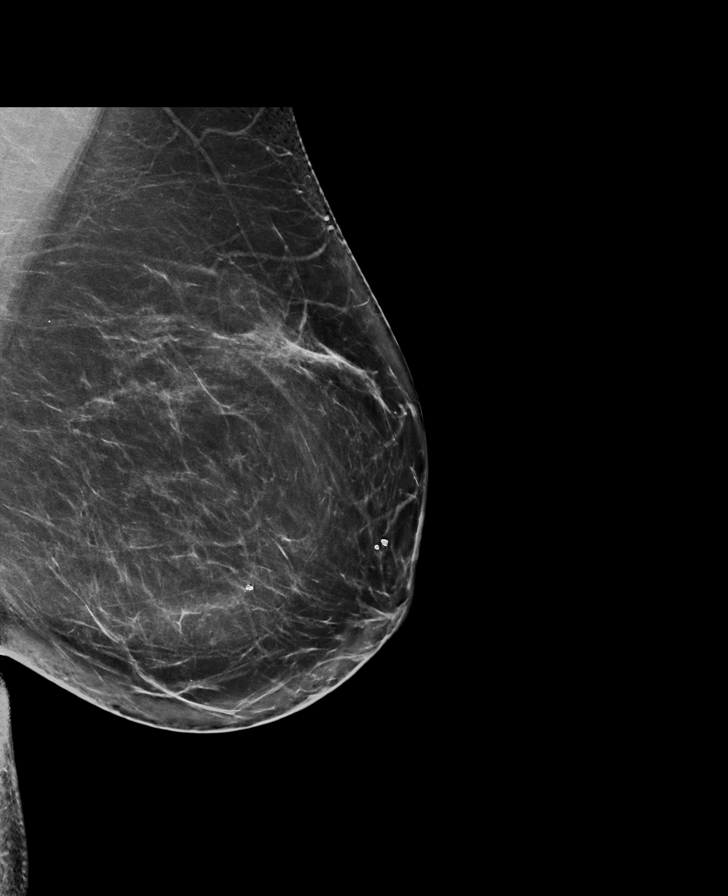

[R CC synth-2D]
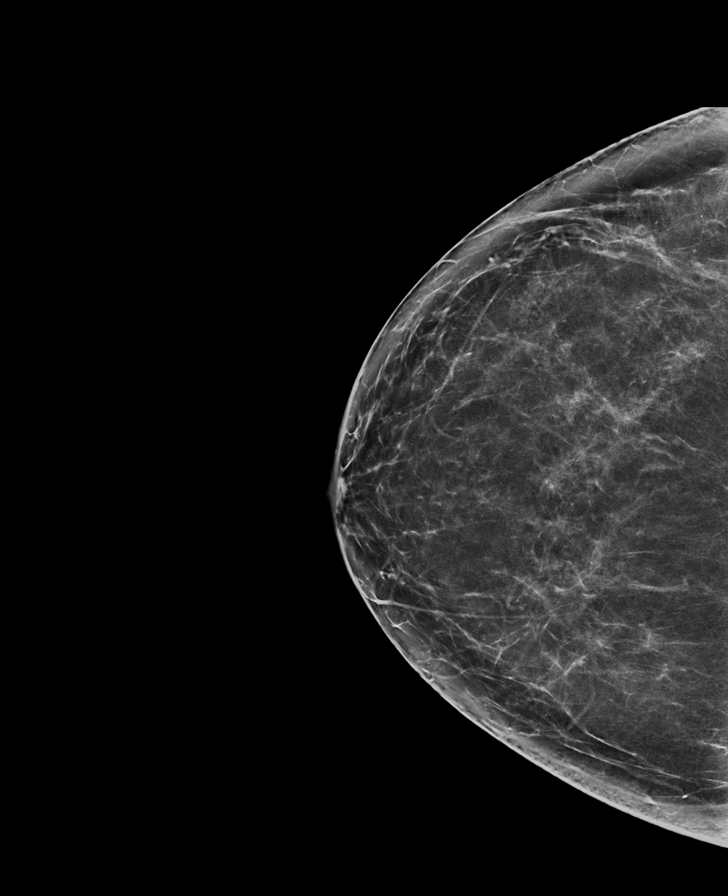

[R MLO synth-2D]
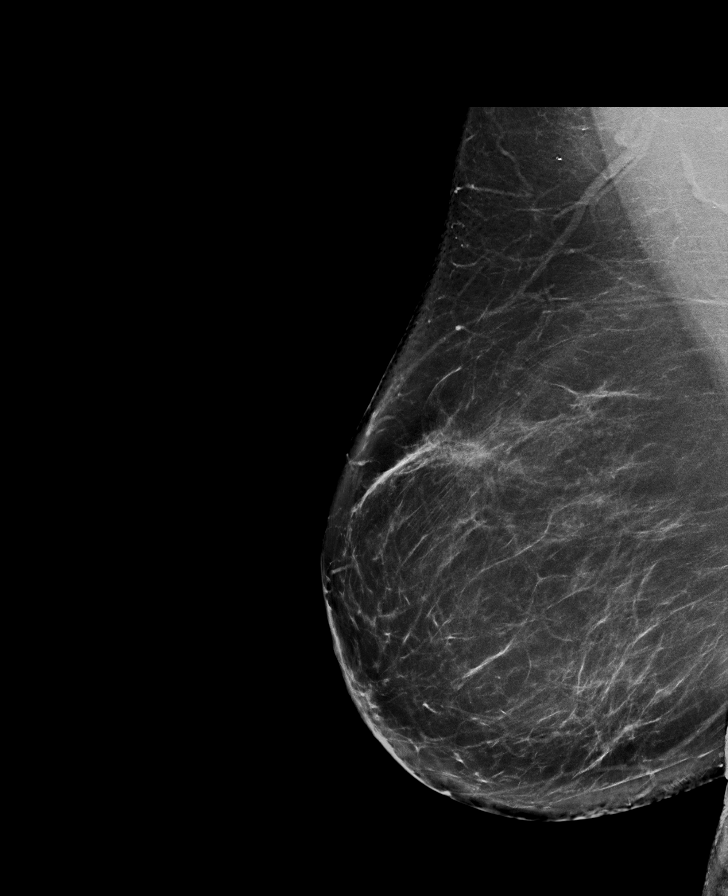

[R MLO tomo · tomo slice 49/98.0]
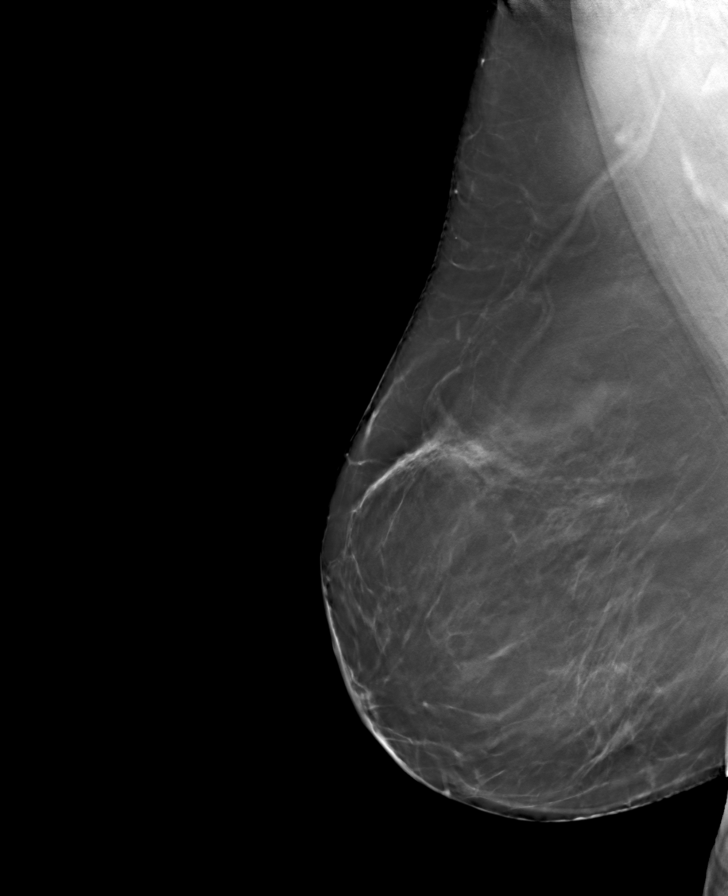

[R CC tomo · tomo slice 40/79.0]
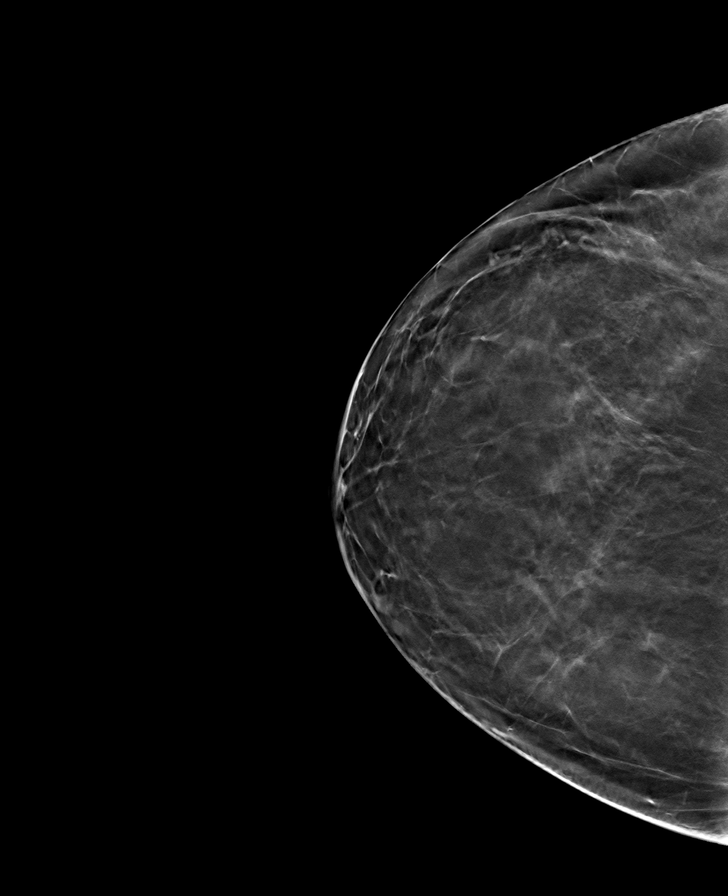

[L MLO tomo · tomo slice 46/91.0]
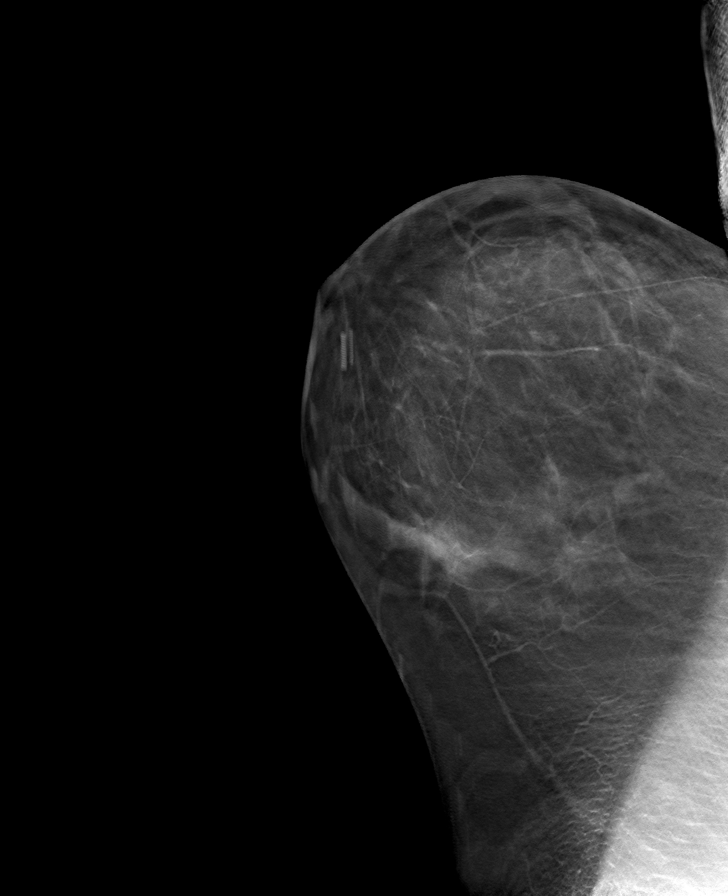

[L CC tomo · tomo slice 45/88.0]
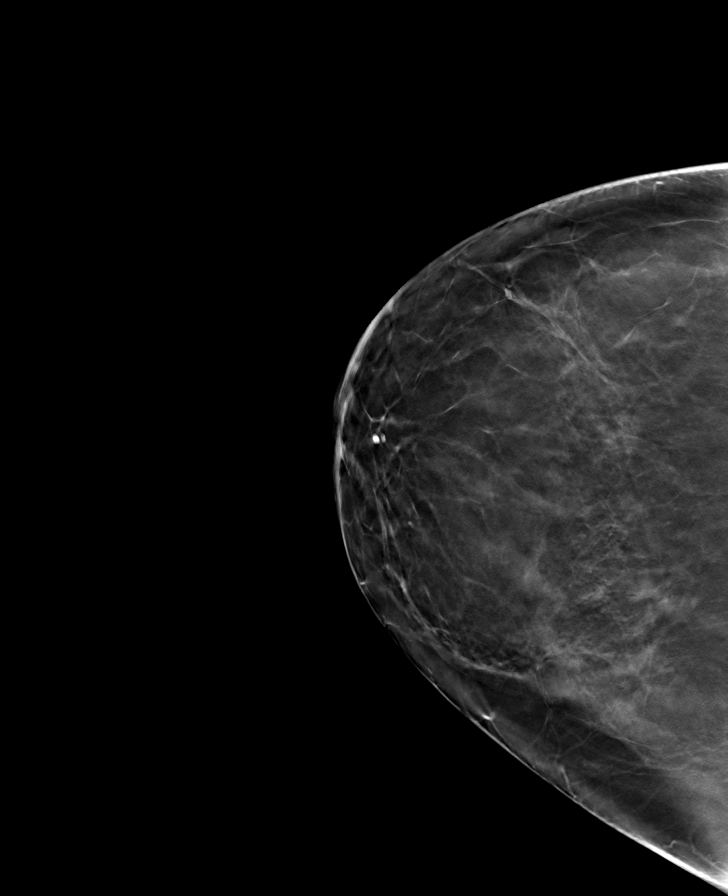

[8 of 24 positions shown; findings below may reference images not displayed]

ACR Breast Density Category b: There are scattered areas of
fibroglandular density.
FINDINGS: There are no findings suspicious for malignancy. Images were
processed with CAD.
IMPRESSION: No mammographic evidence of malignancy. A result letter of this
screening mammogram will be mailed directly to the patient.

RECOMMENDATION:
Screening mammogram in one year. (Code:CN-U-775)

BI-RADS CATEGORY  1: Negative.

## 2021-07-29 ENCOUNTER — Ambulatory Visit: Payer: Self-pay | Admitting: Podiatry

## 2021-08-01 NOTE — Progress Notes (Signed)
° °  HPI: 61 y.o. female presenting today presenting today as a reestablish new patient for evaluation of an injury that occurred about 1 month ago.  Patient states that a wooden gift fell on the top of her right foot.  She does have a history of surgery to this area.  She is concerned now that she has an ingrown to the right third toe.  It is very tender to touch especially on the nail plate.  The toe is swollen.  She has been soaking her foot in Epsom salt.  She presents for further treatment and evaluation  Past Medical History:  Diagnosis Date   GERD (gastroesophageal reflux disease)    IBS (irritable bowel syndrome)    Obesity     Past Surgical History:  Procedure Laterality Date   ABDOMINAL HYSTERECTOMY     BACK SURGERY  1992   Lumbar spine, herniated disc    Allergies  Allergen Reactions   Shellfish Allergy Nausea And Vomiting   Sulfa Antibiotics Hives    STOMACH CRAMPS     Physical Exam: General: The patient is alert and oriented x3 in no acute distress.  Dermatology: Skin is warm, dry and supple bilateral lower extremities. Negative for open lesions or macerations.  There is no open wound especially to the right third toe.  The toenail does appear somewhat dystrophic and discolored and thickened and hypersensitive in this area of trauma  Vascular: Palpable pedal pulses bilaterally. Capillary refill within normal limits.  Negative for any significant edema or erythema  Neurological: Light touch and protective threshold grossly intact  Musculoskeletal Exam: No pedal deformities noted.  Erythema and edema noted specifically to the right third toe  Radiographic Exam RT foot:  Normal osseous mineralization.  Orthopedic screws noted to the second and third digits as well as the distal portion of the second metatarsal left.  Implant with grommets noted first MTPJ left.  All orthopedic hardware appears intact and stable with good alignment There is a small avulsion fracture to the  very distal portion of the distal phalanx of the right third toe adjacent to the head of the orthopedic screw.  Assessment: 1.  Fracture distal phalanx right third toe 2.  History of right forefoot surgery   Plan of Care:  1. Patient evaluated. X-Rays reviewed.  2.  Light debridement of the toenail was performed to the right third toe 3.  Postoperative shoe dispensed.  Weightbearing as tolerated 4.  Prescription for doxycycline 100 mg 2 times daily #20 for prophylaxis since the orthopedic screw is in this area of trauma 5.  Return to clinic in 3 weeks      Felecia Shelling, DPM [facility]  Dr. Felecia Shelling, DPM    [address] [place], [place] [address]                Office [phone]  Fax [phone]

## 2021-08-04 ENCOUNTER — Other Ambulatory Visit: Payer: Self-pay | Admitting: Family Medicine

## 2021-08-04 DIAGNOSIS — N951 Menopausal and female climacteric states: Secondary | ICD-10-CM

## 2021-08-04 NOTE — Telephone Encounter (Signed)
Requested Prescriptions  Pending Prescriptions Disp Refills   PARoxetine (PAXIL) 20 MG tablet [Pharmacy Med Name: PAROXETINE HCL 20 MG TAB] 90 tablet 0    Sig: TAKE 1 TABLET BY MOUTH ONCE DAILY     Psychiatry:  Antidepressants - SSRI Passed - 08/04/2021  1:15 PM      Passed - Valid encounter within last 6 months    Recent Outpatient Visits          1 week ago BPPV (benign paroxysmal positional vertigo), right   [place], DO   2 months ago Acute cystitis without hematuria   [place], DO   1 year ago Pneumonia due to infectious organism, unspecified laterality, unspecified part of lung   [place], FNP   1 year ago Acute bacterial conjunctivitis of left eye   [facility] Olin Hauser, DO   1 year ago Prediabetes   [place], Devonne Doughty, DO

## 2021-08-07 ENCOUNTER — Ambulatory Visit (INDEPENDENT_AMBULATORY_CARE_PROVIDER_SITE_OTHER): Payer: BC Managed Care – PPO

## 2021-08-07 ENCOUNTER — Ambulatory Visit: Payer: BC Managed Care – PPO | Admitting: Podiatry

## 2021-08-07 ENCOUNTER — Other Ambulatory Visit: Payer: Self-pay

## 2021-08-07 ENCOUNTER — Encounter: Payer: Self-pay | Admitting: Podiatry

## 2021-08-07 DIAGNOSIS — S92501A Displaced unspecified fracture of right lesser toe(s), initial encounter for closed fracture: Secondary | ICD-10-CM

## 2021-08-07 NOTE — Progress Notes (Signed)
° °  HPI: 61 y.o. female presenting today for follow-up evaluation of an injury to the patient sustained to the left third toe.  She says that overall she is doing significantly better.  She took the oral antibiotics as prescribed.  She noticed the redness and swelling has decreased significantly.  She presents for further treatment and evaluation  Past Medical History:  Diagnosis Date   GERD (gastroesophageal reflux disease)    IBS (irritable bowel syndrome)    Obesity     Past Surgical History:  Procedure Laterality Date   ABDOMINAL HYSTERECTOMY     BACK SURGERY  1992   Lumbar spine, herniated disc    Allergies  Allergen Reactions   Shellfish Allergy Nausea And Vomiting   Sulfa Antibiotics Hives    STOMACH CRAMPS     Physical Exam: General: The patient is alert and oriented x3 in no acute distress.  Dermatology: Skin is warm, dry and supple bilateral lower extremities. Negative for open lesions or macerations.  Vascular: Palpable pedal pulses bilaterally. Capillary refill within normal limits.  There is some very mild edema noted to the toe right third, however any erythema around the area has resolved  Neurological: Light touch and protective threshold grossly intact  Musculoskeletal Exam: No pedal deformities noted.  Improved tenderness noted to the right third toe since last visit  Radiographic Exam:  Unchanged since last x-rays taken.  Orthopedic screws are intact.  Good alignment of the toes.  There does appear to be a distal fragment of the distal phalanx to the right third toe which was present last x-rays as well  Assessment: 1.  Injury right third toe with fracture of the distal phalanx of the toe   Plan of Care:  1. Patient evaluated. X-Rays reviewed.  2.  For now we will simply observe.  It appears that the patient is doing very well.  She may transition back into regular shoes and sneakers 3.  There is concerned about the toenail being so close to the head of  the orthopedic screw in the toe.  She is concerned about trimming her toenail. 4.  Return to clinic in 2 months for nail debridement to the right third toe and after that we will sign off.       Felecia Shelling, DPM [facility]  Dr. Felecia Shelling, DPM    [address] [place], [place] [address]                Office [phone]  Fax [phone]

## 2021-08-23 ENCOUNTER — Ambulatory Visit (INDEPENDENT_AMBULATORY_CARE_PROVIDER_SITE_OTHER): Payer: BC Managed Care – PPO

## 2021-08-23 ENCOUNTER — Ambulatory Visit
Admission: RE | Admit: 2021-08-23 | Discharge: 2021-08-23 | Disposition: A | Discharge status: Home / Self Care | Payer: BC Managed Care – PPO | Source: Ambulatory Visit | Attending: Internal Medicine | Admitting: Internal Medicine

## 2021-08-23 ENCOUNTER — Other Ambulatory Visit: Payer: Self-pay

## 2021-08-23 VITALS — BP 128/84 | HR 81 | Temp 98.2°F | Resp 18 | Ht 69.0 in | Wt 266.1 lb

## 2021-08-23 DIAGNOSIS — R062 Wheezing: Secondary | ICD-10-CM | POA: Diagnosis present

## 2021-08-23 DIAGNOSIS — Z20822 Contact with and (suspected) exposure to covid-19: Secondary | ICD-10-CM | POA: Insufficient documentation

## 2021-08-23 DIAGNOSIS — J40 Bronchitis, not specified as acute or chronic: Secondary | ICD-10-CM

## 2021-08-23 DIAGNOSIS — R059 Cough, unspecified: Secondary | ICD-10-CM

## 2021-08-23 DIAGNOSIS — R509 Fever, unspecified: Secondary | ICD-10-CM

## 2021-08-23 LAB — RESP PANEL BY RT-PCR (FLU A&B, COVID) ARPGX2
Influenza A by PCR: NEGATIVE
Influenza B by PCR: NEGATIVE
SARS Coronavirus 2 by RT PCR: NEGATIVE

## 2021-08-23 MED ORDER — HYDROCOD POLI-CHLORPHE POLI ER 10-8 MG/5ML PO SUER: 5.0000 mL | Freq: Every evening | ORAL | 0 refills | Status: DC | PRN

## 2021-08-23 MED ORDER — AZITHROMYCIN 250 MG PO TABS: ORAL_TABLET | ORAL | 0 refills | Status: DC

## 2021-08-23 MED ORDER — ALBUTEROL SULFATE HFA 108 (90 BASE) MCG/ACT IN AERS: 2.0000 | INHALATION_SPRAY | RESPIRATORY_TRACT | 0 refills | Status: DC | PRN

## 2021-08-23 NOTE — ED Provider Notes (Signed)
[facility]    CSN: [id number] Arrival date & time: 08/23/21  0841      History   Chief Complaint Chief Complaint  Patient presents with   Appointment   Cough    HPI Ashley Hale is a 61 y.o. female who presents with URI symptoms x 5 days. Developed subjective fever 3 days ago, with sweats and aches. Her cough had been non productive and after taking Mucinex yesterday is a little productive today. Started wheezing yesterday. Has been braking out in sweats more than her usual with menopause. Had a negative at home Covid test. She is prone to get pneumonia when she gets this way.     Past Medical History:  Diagnosis Date   GERD (gastroesophageal reflux disease)    IBS (irritable bowel syndrome)    Obesity     Patient Active Problem List   Diagnosis Date Noted   Pneumonia due to infectious organism 07/04/2020   Osteoarthritis of spine with radiculopathy, cervical region 09/23/2017   Spondylosis of lumbar region without myelopathy or radiculopathy 09/23/2017   Chronic left-sided low back pain with left-sided sciatica 09/23/2017   Pulmonary nodules 08/26/2017   OSA on CPAP 02/21/2017   Hyperlipidemia 02/21/2017   Vitamin D deficiency 12/10/2015   Prediabetes 09/15/2015   GERD (gastroesophageal reflux disease) 12/20/2014   Hot flash, menopausal 12/20/2014   Morbid obesity (HCC) 12/20/2014   H/O renal calculi 05/08/2011    Past Surgical History:  Procedure Laterality Date   ABDOMINAL HYSTERECTOMY     BACK SURGERY  1992   Lumbar spine, herniated disc    OB History   No obstetric history on file.      Home Medications    Prior to Admission medications   Medication Sig Start Date End Date Taking? Authorizing Provider  albuterol (VENTOLIN HFA) 108 (90 Base) MCG/ACT inhaler Inhale 2 puffs into the lungs every 4 (four) hours as needed for wheezing or shortness of breath. Prn cough and wheezing 08/23/21  Yes Rodriguez-Southworth, Nettie Elm, PA-C  azithromycin  (ZITHROMAX Z-PAK) 250 MG tablet 2 today, then one qd x 4 days 08/23/21  Yes Rodriguez-Southworth, Nettie Elm, PA-C  BLACK COHOSH EXTRACT PO Take daily by mouth.   Yes [provider]  chlorpheniramine-HYDROcodone (TUSSIONEX PENNKINETIC ER) 10-8 MG/5ML Take 5 mLs by mouth at bedtime as needed for cough. 08/23/21  Yes Rodriguez-Southworth, Nettie Elm, PA-C  Cholecalciferol (VITAMIN D3) 50 MCG (2000 UT) TABS Take 2,000 Units by mouth daily. 02/11/20  Yes Karamalegos, Netta Neat, DO  cyclobenzaprine (FLEXERIL) 10 MG tablet Take 0.5-1 tablets (5-10 mg total) by mouth 3 (three) times daily as needed for muscle spasms. Recommend start at night only 11/28/17  Yes Karamalegos, Netta Neat, DO  fluticasone (FLONASE) 50 MCG/ACT nasal spray Place 2 sprays into both nostrils daily. Use for 4-6 weeks then stop and use seasonally or as needed. 07/27/21  Yes Karamalegos, Netta Neat, DO  furosemide (LASIX) 20 MG tablet Take 1 tablet (20 mg total) by mouth daily. 09/03/19  Yes Karamalegos, Netta Neat, DO  hydrOXYzine (ATARAX/VISTARIL) 25 MG tablet Take 1-2 tablets (25-50 mg total) by mouth at bedtime as needed for anxiety (insomnia). 11/28/17  Yes Karamalegos, Netta Neat, DO  loratadine (CLARITIN) 10 MG tablet Take 1 tablet (10 mg total) by mouth daily. Use for 4-6 weeks then stop, and use as needed or seasonally 06/23/16  Yes Karamalegos, Netta Neat, DO  meclizine (ANTIVERT) 25 MG tablet Take 1 tablet (25 mg total) by mouth 3 (three) times daily  as needed for dizziness. 07/27/21  Yes Karamalegos, Netta Neat, DO  metFORMIN (GLUCOPHAGE-XR) 500 MG 24 hr tablet TAKE 1 TABLET BY MOUTH ONCE DAILY WITH BREAKFAST 07/28/21  Yes Karamalegos, Netta Neat, DO  omeprazole (PRILOSEC) 20 MG capsule TAKE 1 CAPSULE BY MOUTH TWICE DAILY BEFORE A MEAL 09/02/20  Yes Karamalegos, Alexander J, DO  PARoxetine (PAXIL) 20 MG tablet TAKE 1 TABLET BY MOUTH ONCE DAILY 08/04/21  Yes Karamalegos, Netta Neat, DO  rOPINIRole (REQUIP) 0.25 MG tablet TAKE 1 OR  2 TABLETS BY MOUTH AT BEDTIME AS NEEDED RESTLESS LEGS 06/10/21  Yes Karamalegos, Netta Neat, DO    Family History Family History  Problem Relation Age of Onset   Heart disease Mother    Hypertension Mother    Hyperlipidemia Mother    Heart disease Father    Glaucoma Father    Breast cancer Neg Hx     Social History Social History   Tobacco Use   Smoking status: Never   Smokeless tobacco: Never  Vaping Use   Vaping Use: Never used  Substance Use Topics   Alcohol use: No    Alcohol/week: 0.0 standard drinks   Drug use: No     Allergies   Shellfish allergy and Sulfa antibiotics   Review of Systems Review of Systems  Constitutional:  Positive for appetite change, chills, diaphoresis, fatigue and fever.  HENT:  Positive for congestion, postnasal drip and rhinorrhea. Negative for ear discharge, ear pain, sore throat and trouble swallowing.   Eyes:  Negative for discharge.  Respiratory:  Positive for cough, shortness of breath and wheezing.   Musculoskeletal:  Positive for myalgias.  Skin:  Negative for rash.  Neurological:  Negative for headaches.  Hematological:  Negative for adenopathy.    Physical Exam Triage Vital Signs ED Triage Vitals  Enc Vitals Group     BP 08/23/21 0855 128/84     Pulse Rate 08/23/21 0855 81     Resp 08/23/21 0855 18     Temp 08/23/21 0855 98.2 F (36.8 C)     Temp Source 08/23/21 0855 Oral     SpO2 08/23/21 0855 97 %     Weight 08/23/21 0853 266 lb 1.5 oz (120.7 kg)     Height 08/23/21 0853 5\' 9"  (1.753 m)     Head Circumference --      Peak Flow --      Pain Score 08/23/21 0853 4     Pain Loc --      Pain Edu? --      Excl. in GC? --    No data found.  Updated Vital Signs BP 128/84 (BP Location: Left Arm)    Pulse 81    Temp 98.2 F (36.8 C) (Oral)    Resp 18    Ht 5\' 9"  (1.753 m)    Wt 266 lb 1.5 oz (120.7 kg)    SpO2 97%    BMI 39.30 kg/m   Visual Acuity Right Eye Distance:   Left Eye Distance:   Bilateral Distance:     Right Eye Near:   Left Eye Near:    Bilateral Near:      Physical Exam Constitutional:      General: she is not in acute distress.    Appearance: He is not toxic-appearing.  HENT:     Head: Normocephalic.     Right Ear: Tympanic membrane, ear canal and external ear normal.     Left Ear: Ear canal and external ear normal.  Nose: Nose normal.     Mouth/Throat: clear    Mouth: Mucous membranes are moist.     Pharynx: Oropharynx is clear.  Eyes:     General: No scleral icterus.    Conjunctiva/sclera: Conjunctivae normal.  Cardiovascular:     Rate and Rhythm: Normal rate and regular rhythm.     Heart sounds: No murmur heard.   Pulmonary:     Effort: Pulmonary effort is normal. No respiratory distress.     Breath sounds: Wheezing present.     Musculoskeletal:        General: Normal range of motion.     Cervical back: Neck supple.  Lymphadenopathy:     Cervical: No cervical adenopathy.  Skin:    General: Skin is warm and dry.     Findings: No rash.  Neurological:     Mental Status: she is alert and oriented to person, place, and time.     Gait: Gait normal.  Psychiatric:        Mood and Affect: Mood normal.        Behavior: Behavior normal.        Thought Content: Thought content normal.        Judgment: Judgment normal.    UC Treatments / Results  Labs (all labs ordered are listed, but only abnormal results are displayed) Labs Reviewed  RESP PANEL BY RT-PCR (FLU A&B, COVID) [id number]    EKG   Radiology DG Chest 2 View  Result Date: 08/23/2021 CLINICAL DATA:  Coughing congestion.  Swelling and fever. EXAM: CHEST - 2 VIEW COMPARISON:  07/25/2017 FINDINGS: Lungs are hyperexpanded. The lungs are clear without focal pneumonia, edema, pneumothorax or pleural effusion. The cardiopericardial silhouette is within normal limits for size. The visualized bony structures of the thorax show no acute abnormality. IMPRESSION: No active cardiopulmonary disease. Electronically  Signed   By: Kennith Center M.D.   On: 08/23/2021 09:21    Procedures Procedures (including critical care time)  Medications Ordered in UC Medications - No data to display  Initial Impression / Assessment and Plan / UC Course  I have reviewed the triage vital signs and the nursing notes. Pertinent labs & imaging results that were available during my care of the patient were reviewed by me and considered in my medical decision making (see chart for details). Has URI and possibly early bronchitis.  I placed her on Albuterol inhaler, Zpack, and Tussionex as noted.  See instructions.    Final Clinical Impressions(s) / UC Diagnoses   Final diagnoses:  Bronchitis     Discharge Instructions      Follow up with your primary care provider next week to have your lungs rechecked      ED Prescriptions     Medication Sig Dispense Auth. Provider   azithromycin (ZITHROMAX Z-PAK) 250 MG tablet 2 today, then one qd x 4 days 6 tablet Rodriguez-Southworth, Marlin Brys, PA-C   albuterol (VENTOLIN HFA) 108 (90 Base) MCG/ACT inhaler Inhale 2 puffs into the lungs every 4 (four) hours as needed for wheezing or shortness of breath. Prn cough and wheezing 18 g Rodriguez-Southworth, Nettie Elm, PA-C   chlorpheniramine-HYDROcodone (TUSSIONEX PENNKINETIC ER) 10-8 MG/5ML Take 5 mLs by mouth at bedtime as needed for cough. 115 mL Rodriguez-Southworth, Nettie Elm, PA-C      I have reviewed the PDMP during this encounter.   Garey Ham, PA-C 08/23/21 1024

## 2021-08-23 NOTE — Discharge Instructions (Addendum)
Follow up with your primary care provider next week to have your lungs rechecked

## 2021-08-23 NOTE — ED Triage Notes (Signed)
Pt c/o cough, nasal congestion, subjective fever, shortness of breath, fatigue. Started about 5 days ago. Cough is keeping her up at night.

## 2021-10-09 ENCOUNTER — Ambulatory Visit: Payer: BC Managed Care – PPO | Admitting: Podiatry

## 2021-10-30 ENCOUNTER — Other Ambulatory Visit: Payer: Self-pay | Admitting: Family Medicine

## 2021-10-30 DIAGNOSIS — G2581 Restless legs syndrome: Secondary | ICD-10-CM

## 2021-10-30 DIAGNOSIS — N951 Menopausal and female climacteric states: Secondary | ICD-10-CM

## 2021-10-30 DIAGNOSIS — K219 Gastro-esophageal reflux disease without esophagitis: Secondary | ICD-10-CM

## 2021-10-30 NOTE — Telephone Encounter (Signed)
Requested Prescriptions  ?Pending Prescriptions Disp Refills  ?? omeprazole (PRILOSEC) 20 MG capsule [Pharmacy Med Name: OMEPRAZOLE DR 20 MG CAP] 180 capsule 1  ?  Sig: TAKE 1 CAPSULE BY MOUTH TWICE DAILY BEFORE A MEAL  ?  ? Gastroenterology: Proton Pump Inhibitors Passed - 10/30/2021 11:34 AM  ?  ?  Passed - Valid encounter within last 12 months  ?  Recent Outpatient Visits   ?      ? 3 months ago BPPV (benign paroxysmal positional vertigo), right  ? [facility] [place], Netta Neat, DO  ? 5 months ago Acute cystitis without hematuria  ? [facility] [place], Netta Neat, DO  ? 1 year ago Pneumonia due to infectious organism, unspecified laterality, unspecified part of lung  ? [facility], Jodelle Gross, FNP  ? 1 year ago Acute bacterial conjunctivitis of left eye  ? [facility] [place], Netta Neat, DO  ? 1 year ago Prediabetes  ? [facility] [place], Netta Neat, DO  ?  ?  ? ?  ?  ?  ?? rOPINIRole (REQUIP) 0.25 MG tablet [Pharmacy Med Name: ROPINIROLE HCL 0.25 MG TAB] 180 tablet 0  ?  Sig: TAKE 1 OR 2 TABLETS BY MOUTH AT BEDTIME AS NEEDED RESTLESS LEGS  ?  ? Neurology:  Parkinsonian Agents Passed - 10/30/2021 11:34 AM  ?  ?  Passed - Last BP in normal range  ?  BP Readings from Last 1 Encounters:  ?08/23/21 128/84  ?   ?  ?  Passed - Last Heart Rate in normal range  ?  Pulse Readings from Last 1 Encounters:  ?08/23/21 81  ?   ?  ?  Passed - Valid encounter within last 12 months  ?  Recent Outpatient Visits   ?      ? 3 months ago BPPV (benign paroxysmal positional vertigo), right  ? [facility] [place], Netta Neat, DO  ? 5 months ago Acute cystitis without hematuria  ? [facility] [place], Netta Neat, DO  ? 1 year ago Pneumonia due to infectious organism, unspecified laterality, unspecified part of lung  ? [facility], Jodelle Gross, FNP  ? 1 year ago Acute  bacterial conjunctivitis of left eye  ? [facility] [place], Netta Neat, DO  ? 1 year ago Prediabetes  ? [facility] Smitty Cords, DO  ?  ?  ? ?  ?  ?  ?? PARoxetine (PAXIL) 20 MG tablet [Pharmacy Med Name: PAROXETINE HCL 20 MG TAB] 90 tablet 0  ?  Sig: TAKE 1 TABLET BY MOUTH ONCE DAILY  ?  ? Psychiatry:  Antidepressants - SSRI Passed - 10/30/2021 11:34 AM  ?  ?  Passed - Valid encounter within last 6 months  ?  Recent Outpatient Visits   ?      ? 3 months ago BPPV (benign paroxysmal positional vertigo), right  ? [facility] [place], Netta Neat, DO  ? 5 months ago Acute cystitis without hematuria  ? [facility] [place], Netta Neat, DO  ? 1 year ago Pneumonia due to infectious organism, unspecified laterality, unspecified part of lung  ? [facility], Jodelle Gross, FNP  ? 1 year ago Acute bacterial conjunctivitis of left eye  ? [facility] [place], Netta Neat, DO  ? 1 year ago Prediabetes  ?  [facility] Smitty Cords, DO  ?  ?  ? ?  ?  ?  ? ?

## 2021-10-30 NOTE — Telephone Encounter (Signed)
Requested medication (s) are due for refill today: expired medication ? ?Requested medication (s) are on the active medication list: yes ? ?Last refill:  09/02/20 #180 1 refills ? ?Future visit scheduled: no ? ?Notes to clinic:  expired medication do you want to renew Rx? ? ? ?  ?Requested Prescriptions  ?Pending Prescriptions Disp Refills  ? omeprazole (PRILOSEC) 20 MG capsule [Pharmacy Med Name: OMEPRAZOLE DR 20 MG CAP] 180 capsule 1  ?  Sig: TAKE 1 CAPSULE BY MOUTH TWICE DAILY BEFORE A MEAL  ?  ? Gastroenterology: Proton Pump Inhibitors Passed - 10/30/2021 11:34 AM  ?  ?  Passed - Valid encounter within last 12 months  ?  Recent Outpatient Visits   ? ?      ? 3 months ago BPPV (benign paroxysmal positional vertigo), right  ? [facility] [place], Netta Neat, DO  ? 5 months ago Acute cystitis without hematuria  ? [facility] [place], Netta Neat, DO  ? 1 year ago Pneumonia due to infectious organism, unspecified laterality, unspecified part of lung  ? [facility], Jodelle Gross, FNP  ? 1 year ago Acute bacterial conjunctivitis of left eye  ? [facility] [place], Netta Neat, DO  ? 1 year ago Prediabetes  ? [facility] Smitty Cords, DO  ? ?  ?  ? ?  ?  ?  ?Signed Prescriptions Disp Refills  ? rOPINIRole (REQUIP) 0.25 MG tablet 180 tablet 0  ?  Sig: TAKE 1 OR 2 TABLETS BY MOUTH AT BEDTIME AS NEEDED RESTLESS LEGS  ?  ? Neurology:  Parkinsonian Agents Passed - 10/30/2021 11:34 AM  ?  ?  Passed - Last BP in normal range  ?  BP Readings from Last 1 Encounters:  ?08/23/21 128/84  ?  ?  ?  ?  Passed - Last Heart Rate in normal range  ?  Pulse Readings from Last 1 Encounters:  ?08/23/21 81  ?  ?  ?  ?  Passed - Valid encounter within last 12 months  ?  Recent Outpatient Visits   ? ?      ? 3 months ago BPPV (benign paroxysmal positional vertigo), right  ? [facility] [place], Netta Neat, DO  ? 5 months  ago Acute cystitis without hematuria  ? [facility], Netta Neat, DO  ? 1 year ago Pneumonia due to infectious organism, unspecified laterality, unspecified part of lung  ? [facility], Jodelle Gross, FNP  ? 1 year ago Acute bacterial conjunctivitis of left eye  ? [facility] [place], Netta Neat, DO  ? 1 year ago Prediabetes  ? [facility] Smitty Cords, DO  ? ?  ?  ? ?  ?  ?  ? PARoxetine (PAXIL) 20 MG tablet 90 tablet 0  ?  Sig: TAKE 1 TABLET BY MOUTH ONCE DAILY  ?  ? Psychiatry:  Antidepressants - SSRI Passed - 10/30/2021 11:34 AM  ?  ?  Passed - Valid encounter within last 6 months  ?  Recent Outpatient Visits   ? ?      ? 3 months ago BPPV (benign paroxysmal positional vertigo), right  ? [facility] [place], Netta Neat, DO  ? 5 months ago Acute cystitis without hematuria  ? [facility] [place], Netta Neat, DO  ? 1 year ago Pneumonia due to  infectious organism, unspecified laterality, unspecified part of lung  ? [facility], Jodelle Gross, FNP  ? 1 year ago Acute bacterial conjunctivitis of left eye  ? [facility] [place], Netta Neat, DO  ? 1 year ago Prediabetes  ? [facility] Smitty Cords, DO  ? ?  ?  ? ?  ?  ?  ? ?

## 2021-11-30 ENCOUNTER — Ambulatory Visit (INDEPENDENT_AMBULATORY_CARE_PROVIDER_SITE_OTHER): Payer: BC Managed Care – PPO | Admitting: Family Medicine

## 2021-11-30 ENCOUNTER — Encounter: Payer: Self-pay | Admitting: Family Medicine

## 2021-11-30 VITALS — BP 126/80 | HR 81 | Ht 69.0 in | Wt 265.0 lb

## 2021-11-30 DIAGNOSIS — Z0000 Encounter for general adult medical examination without abnormal findings: Secondary | ICD-10-CM

## 2021-11-30 DIAGNOSIS — N951 Menopausal and female climacteric states: Secondary | ICD-10-CM

## 2021-11-30 DIAGNOSIS — E782 Mixed hyperlipidemia: Secondary | ICD-10-CM

## 2021-11-30 DIAGNOSIS — T148XXA Other injury of unspecified body region, initial encounter: Secondary | ICD-10-CM

## 2021-11-30 DIAGNOSIS — Z1231 Encounter for screening mammogram for malignant neoplasm of breast: Secondary | ICD-10-CM

## 2021-11-30 DIAGNOSIS — G2581 Restless legs syndrome: Secondary | ICD-10-CM

## 2021-11-30 DIAGNOSIS — Z9989 Dependence on other enabling machines and devices: Secondary | ICD-10-CM

## 2021-11-30 DIAGNOSIS — R7303 Prediabetes: Secondary | ICD-10-CM | POA: Diagnosis not present

## 2021-11-30 DIAGNOSIS — G4733 Obstructive sleep apnea (adult) (pediatric): Secondary | ICD-10-CM | POA: Diagnosis not present

## 2021-11-30 DIAGNOSIS — F4321 Adjustment disorder with depressed mood: Secondary | ICD-10-CM

## 2021-11-30 MED ORDER — MUPIROCIN 2 % EX OINT: 1.0000 "application " | TOPICAL_OINTMENT | Freq: Two times a day (BID) | CUTANEOUS | 1 refills | Status: DC

## 2021-11-30 MED ORDER — BUPROPION HCL ER (XL) 150 MG PO TB24: 150.0000 mg | ORAL_TABLET | Freq: Every day | ORAL | 3 refills | Status: DC

## 2021-11-30 MED ORDER — ROPINIROLE HCL 0.5 MG PO TABS: ORAL_TABLET | ORAL | 3 refills | Status: DC

## 2021-11-30 MED ORDER — PAROXETINE HCL 20 MG PO TABS: 20.0000 mg | ORAL_TABLET | Freq: Every day | ORAL | 3 refills | Status: DC

## 2021-11-30 MED ORDER — METFORMIN HCL ER 500 MG PO TB24: 500.0000 mg | ORAL_TABLET | Freq: Every day | ORAL | 3 refills | Status: DC

## 2021-11-30 NOTE — Assessment & Plan Note (Signed)
Controlled, chronic OSA on CPAP ?Improving adherence but still not using regularly ?- Continue current CPAP therapy, patient seems to be benefiting from therapy ?  ?

## 2021-11-30 NOTE — Assessment & Plan Note (Signed)
Previous A1c 6.4 ?Due for repeat, lab tomorrow ?Complication obesity HTN HLD OSA ? ?Continue Metformin 500 daily ?Discuss GLP1 therapy as indicated ?

## 2021-11-30 NOTE — Progress Notes (Signed)
? ?Subjective:  ? ? Patient ID: Ashley Hale, female    DOB: 1961-05-10, 61 y.o.   MRN: [id number] ? ?Ashley Hale is a 61 y.o. female presenting on 11/30/2021 for Annual Exam ? ? ?HPI ? ?Here for Annual Physical and Due for fasting lab ? ?Pre-DM ?Last A1c previously overdue ?Will return for labs ?On metformin still ? ?OSA on CPAP ?Not always using CPAP but when uses it does help ? ?Adjustment disorder depressed mood ?Recent life stressors impacting her ?Interested to resume wellbutrin ? ?Morbid obesity BMI >39 ?Failed phentermine in past ?Previously started Saxenda 01/2020 through 02/2021. Ineffective results. ?Asking about Wegovy vs  Ozempic coverage for Pre Diabetes, vs Weight Loss ? ?RLS ?Taking Ropinirole, does need something in afternoon ? ?Health Maintenance: ? ?Due mammogram. Ordered today. ? ?Cologuard negative 2021, next due 2024. ? ? ?  11/30/2021  ?  4:05 PM 02/11/2020  ?  8:32 AM 11/06/2018  ?  8:19 AM  ?Depression screen PHQ 2/9  ?Decreased Interest 2 0 0  ?Down, Depressed, Hopeless 1 0 0  ?PHQ - 2 Score 3 0 0  ?Altered sleeping 0    ?Tired, decreased energy 2    ?Change in appetite 0    ?Feeling bad or failure about yourself  0    ?Moving slowly or fidgety/restless 0    ?Suicidal thoughts 0    ?PHQ-9 Score 5    ?Difficult doing work/chores Somewhat difficult    ? ? ?  11/06/2018  ?  8:20 AM 11/28/2017  ?  4:42 PM  ?GAD 7 : Generalized Anxiety Score  ?Nervous, Anxious, on Edge 0 3  ?Control/stop worrying 0 2  ?Worry too much - different things 0 2  ?Trouble relaxing 0 2  ?Restless 0 2  ?Easily annoyed or irritable 0 1  ?Afraid - awful might happen 0 0  ?Total GAD 7 Score 0 12  ?Anxiety Difficulty Somewhat difficult Not difficult at all  ? ? ? ? ?Past Medical History:  ?Diagnosis Date  ? GERD (gastroesophageal reflux disease)   ? IBS (irritable bowel syndrome)   ? Obesity   ? ?Past Surgical History:  ?Procedure Laterality Date  ? ABDOMINAL HYSTERECTOMY    ? BACK SURGERY  1992  ? Lumbar spine, herniated disc   ? ?Social History  ? ?Socioeconomic History  ? Marital status: Married  ?  Spouse name: Not on file  ? Number of children: Not on file  ? Years of education: Not on file  ? Highest education level: Not on file  ?Occupational History  ? Not on file  ?Tobacco Use  ? Smoking status: Never  ? Smokeless tobacco: Never  ?Vaping Use  ? Vaping Use: Never used  ?Substance and Sexual Activity  ? Alcohol use: No  ?  Alcohol/week: 0.0 standard drinks  ? Drug use: No  ? Sexual activity: Not on file  ?Other Topics Concern  ? Not on file  ?Social History Narrative  ? Not on file  ? ?Social Determinants of Health  ? ?Financial Resource Strain: Not on file  ?Food Insecurity: Not on file  ?Transportation Needs: Not on file  ?Physical Activity: Not on file  ?Stress: Not on file  ?Social Connections: Not on file  ?Intimate Partner Violence: Not on file  ? ?Family History  ?Problem Relation Age of Onset  ? Heart disease Mother   ? Hypertension Mother   ? Hyperlipidemia Mother   ? Heart disease Father   ? Glaucoma Father   ?  Breast cancer Neg Hx   ? ?Current Outpatient Medications on File Prior to Visit  ?Medication Sig  ? albuterol (VENTOLIN HFA) 108 (90 Base) MCG/ACT inhaler Inhale 2 puffs into the lungs every 4 (four) hours as needed for wheezing or shortness of breath. Prn cough and wheezing  ? BLACK COHOSH EXTRACT PO Take daily by mouth.  ? Cholecalciferol (VITAMIN D3) 50 MCG (2000 UT) TABS Take 2,000 Units by mouth daily.  ? cyclobenzaprine (FLEXERIL) 10 MG tablet Take 0.5-1 tablets (5-10 mg total) by mouth 3 (three) times daily as needed for muscle spasms. Recommend start at night only  ? fluticasone (FLONASE) 50 MCG/ACT nasal spray Place 2 sprays into both nostrils daily. Use for 4-6 weeks then stop and use seasonally or as needed.  ? furosemide (LASIX) 20 MG tablet Take 1 tablet (20 mg total) by mouth daily.  ? hydrOXYzine (ATARAX/VISTARIL) 25 MG tablet Take 1-2 tablets (25-50 mg total) by mouth at bedtime as needed for anxiety  (insomnia).  ? loratadine (CLARITIN) 10 MG tablet Take 1 tablet (10 mg total) by mouth daily. Use for 4-6 weeks then stop, and use as needed or seasonally  ? meclizine (ANTIVERT) 25 MG tablet Take 1 tablet (25 mg total) by mouth 3 (three) times daily as needed for dizziness.  ? omeprazole (PRILOSEC) 20 MG capsule TAKE 1 CAPSULE BY MOUTH TWICE DAILY BEFORE A MEAL  ? ?No current facility-administered medications on file prior to visit.  ? ? ?Review of Systems  ?Constitutional:  Negative for activity change, appetite change, chills, diaphoresis, fatigue and fever.  ?HENT:  Negative for congestion and hearing loss.   ?Eyes:  Negative for visual disturbance.  ?Respiratory:  Negative for cough, chest tightness, shortness of breath and wheezing.   ?Cardiovascular:  Negative for chest pain, palpitations and leg swelling.  ?Gastrointestinal:  Negative for abdominal pain, constipation, diarrhea, nausea and vomiting.  ?Genitourinary:  Negative for dysuria, frequency and hematuria.  ?Musculoskeletal:  Negative for arthralgias and neck pain.  ?Skin:  Negative for rash.  ?Neurological:  Negative for dizziness, weakness, light-headedness, numbness and headaches.  ?Hematological:  Negative for adenopathy.  ?Psychiatric/Behavioral:  Negative for behavioral problems, dysphoric mood and sleep disturbance.   ?Per HPI unless specifically indicated above ? ? ?   ?Objective:  ?  ?BP 126/80   Pulse 81   Ht 5\' 9"  (1.753 m)   Wt 265 lb (120.2 kg)   SpO2 99%   BMI 39.13 kg/m?   ?Wt Readings from Last 3 Encounters:  ?11/30/21 265 lb (120.2 kg)  ?08/23/21 266 lb 1.5 oz (120.7 kg)  ?07/27/21 266 lb 3.2 oz (120.7 kg)  ?  ?Physical Exam ?Vitals and nursing note reviewed.  ?Constitutional:   ?   General: She is not in acute distress. ?   Appearance: She is well-developed. She is not diaphoretic.  ?   Comments: Well-appearing, comfortable, cooperative  ?HENT:  ?   Head: Normocephalic and atraumatic.  ?Eyes:  ?   General:     ?   Right eye: No  discharge.     ?   Left eye: No discharge.  ?   Conjunctiva/sclera: Conjunctivae normal.  ?   Pupils: Pupils are equal, round, and reactive to light.  ?Neck:  ?   Thyroid: No thyromegaly.  ?   Vascular: No carotid bruit.  ?Cardiovascular:  ?   Rate and Rhythm: Normal rate and regular rhythm.  ?   Pulses: Normal pulses.  ?   Heart sounds:  Normal heart sounds. No murmur heard. ?Pulmonary:  ?   Effort: Pulmonary effort is normal. No respiratory distress.  ?   Breath sounds: Normal breath sounds. No wheezing or rales.  ?Abdominal:  ?   General: Bowel sounds are normal. There is no distension.  ?   Palpations: Abdomen is soft. There is no mass.  ?   Tenderness: There is no abdominal tenderness.  ?Musculoskeletal:     ?   General: No tenderness. Normal range of motion.  ?   Cervical back: Normal range of motion and neck supple.  ?   Right lower leg: No edema.  ?   Left lower leg: No edema.  ?   Comments: Upper / Lower Extremities: ?- Normal muscle tone, strength bilateral upper extremities 5/5, lower extremities 5/5  ?Lymphadenopathy:  ?   Cervical: No cervical adenopathy.  ?Skin: ?   General: Skin is warm and dry.  ?   Findings: Lesion (abrasion not healing left forearm, R lower leg) present. No erythema or rash.  ?Neurological:  ?   Mental Status: She is alert and oriented to person, place, and time.  ?   Comments: Distal sensation intact to light touch all extremities  ?Psychiatric:     ?   Mood and Affect: Mood normal.     ?   Behavior: Behavior normal.     ?   Thought Content: Thought content normal.  ?   Comments: Well groomed, good eye contact, normal speech and thoughts  ? ? ? ? ?Results for orders placed or performed during the hospital encounter of 08/23/21  ?Resp Panel by RT-PCR (Flu A&B, Covid) Nasopharyngeal Swab  ? Specimen: Nasopharyngeal Swab; Nasopharyngeal(NP) swabs in vial transport medium  ?Result Value Ref Range  ? SARS Coronavirus 2 by RT PCR NEGATIVE NEGATIVE  ? Influenza A by PCR NEGATIVE NEGATIVE   ? Influenza B by PCR NEGATIVE NEGATIVE  ? ?   ?Assessment & Plan:  ? ?Problem List Items Addressed This Visit   ? ? Prediabetes  ? Relevant Medications  ? metFORMIN (GLUCOPHAGE-XR) 500 MG 24 hr tablet  ? Oth

## 2021-11-30 NOTE — Patient Instructions (Addendum)
Thank you for coming to the office today. ? ?Start Wellbutrin XL 150mg  daily  ? ?Topical mupirocin for wounds not healing ? ?Pending lab results A1c will help determine which med to order. ? ? ?Call insurance find cost and coverage of the following - check the following: ?- Drug Tier, Preferred List, On Formulary ?- All will require a "Prior Authorization" from [place] first, before you can find out the cost ?- Find out if there is "Step Therapy" (other medicines required before you can try these) ? ?Once you pick the one you want to try, let me know - we can get a sample ready IF we have it in stock. Then try it - and before running out of medicine, contact me back to order your Rx so we have time to get it processed. ? ? ?Ask about the diagnosis code required for the Ozempic - Pre-Diabetes R73.03 ?  ?1. Ozempic (Semaglutide injection) - start 0.25mg  weekly for 4 weeks then increase to 0.5mg  weekly, sample is 6 doses, re-use the same pen until empty, new needle each dose. ?  ?2. Trulicity (Dulaglutide) - once weekly 0.75 (likely we would start) and 1.5 max dose, sample is 2 doses, 1 dose per pen, each pen is a one time use, no visible needle, it is an auto-injector. ? ?---------- ? ?For Weight Loss / Obesity only ? ?Most likely need to use one of these - since you have tried the Saxenda we would order [facility] for this code Morbid Obesity E66.01 ? ?Wegovy (same as Ozempic) weekly injection - start 0.25mg  weekly, 1 dose per pen, single use, auto-injector ? ?2. Saxenda - DAILY injection - start 0.6mg  injection DAILY, sample is 3 weeks, new needle each dose. ? ? ?3 benefits ?- 1 significantly reduced A1c sugar, and may be able to reduce or stop metformin in future ?- 2 reduced appetite and weight loss with good results ?- 3 cardiovascular risk reduction, less likely to have heart attack/stroke ? ? ?--------------------------------------------- ? ? ?Please schedule a Follow-up Appointment to: Return in about 1 day (around  12/01/2021) for fasting lab tomorrow 5/16 - then 3 month follow-up Weight Management. ? ?If you have any other questions or concerns, please feel free to call the office or send a message through MyChart. You may also schedule an earlier appointment if necessary. ? ?Additionally, you may be receiving a survey about your experience at our office within a few days to 1 week by e-mail or mail. We value your feedback. ? ?6/16, DO ?[facility], [facility] ?

## 2021-11-30 NOTE — Assessment & Plan Note (Signed)
Clinically consistent with Morbid Obesity with BMI >39 (< 40) due to comorbid conditions with OSA, HLD, GERD ?- Prior failed Phentermine med, Saxenda ? ?Plan: ?Check A1c lab, to determine if PreDM vs DM ?If PreDM would offer Wegovy rx and work on authorization, she says insurance covers [place] but that is likely only for DM diagnosis ?Continue Metformin 500 daily ?- Counseling on encourage lifestyle changes to improve regular exercise and continue diet ? ?Add Wellbutrin for mood and appetite ?

## 2021-12-02 LAB — TSH: TSH: 1.55 mIU/L (ref 0.40–4.50)

## 2021-12-02 LAB — HEMOGLOBIN A1C
Hgb A1c MFr Bld: 6.6 % of total Hgb — ABNORMAL HIGH (ref <5.7)
Mean Plasma Glucose: 143 mg/dL
eAG (mmol/L): 7.9 mmol/L

## 2021-12-02 LAB — COMPLETE METABOLIC PANEL WITH GFR
AG Ratio: 1.4 (calc) (ref 1.0–2.5)
ALT: 16 U/L (ref 6–29)
AST: 15 U/L (ref 10–35)
Albumin: 3.9 g/dL (ref 3.6–5.1)
Alkaline phosphatase (APISO): 100 U/L (ref 37–153)
BUN: 13 mg/dL (ref 7–25)
CO2: 27 mmol/L (ref 20–32)
Calcium: 9 mg/dL (ref 8.6–10.4)
Chloride: 106 mmol/L (ref 98–110)
Creat: 1.03 mg/dL (ref 0.50–1.05)
Globulin: 2.7 g/dL (calc) (ref 1.9–3.7)
Glucose, Bld: 117 mg/dL — ABNORMAL HIGH (ref 65–99)
Potassium: 4.2 mmol/L (ref 3.5–5.3)
Sodium: 141 mmol/L (ref 135–146)
Total Bilirubin: 0.3 mg/dL (ref 0.2–1.2)
Total Protein: 6.6 g/dL (ref 6.1–8.1)
eGFR: 61 mL/min/{1.73_m2} (ref >=60)

## 2021-12-02 LAB — CBC WITH DIFFERENTIAL/PLATELET
Absolute Monocytes: 455 cells/uL (ref 200–950)
Basophils Absolute: 40 cells/uL (ref 0–200)
Basophils Relative: 0.8 %
Eosinophils Absolute: 140 cells/uL (ref 15–500)
Eosinophils Relative: 2.8 %
HCT: 38.6 % (ref 35.0–45.0)
Hemoglobin: 12.9 g/dL (ref 11.7–15.5)
Lymphs Abs: 1465 cells/uL (ref 850–3900)
MCH: 29.3 pg (ref 27.0–33.0)
MCHC: 33.4 g/dL (ref 32.0–36.0)
MCV: 87.5 fL (ref 80.0–100.0)
MPV: 9.5 fL (ref 7.5–12.5)
Monocytes Relative: 9.1 %
Neutro Abs: 2900 cells/uL (ref 1500–7800)
Neutrophils Relative %: 58 %
Platelets: 261 10*3/uL (ref 140–400)
RBC: 4.41 10*6/uL (ref 3.80–5.10)
RDW: 13 % (ref 11.0–15.0)
Total Lymphocyte: 29.3 %
WBC: 5 10*3/uL (ref 3.8–10.8)

## 2021-12-02 LAB — LIPID PANEL
Cholesterol: 204 mg/dL — ABNORMAL HIGH (ref <200)
HDL: 49 mg/dL — ABNORMAL LOW (ref >=50)
LDL Cholesterol (Calc): 130 mg/dL (calc) — ABNORMAL HIGH
Non-HDL Cholesterol (Calc): 155 mg/dL (calc) — ABNORMAL HIGH (ref <130)
Total CHOL/HDL Ratio: 4.2 (calc) (ref <5.0)
Triglycerides: 141 mg/dL (ref <150)

## 2021-12-04 ENCOUNTER — Encounter: Payer: Self-pay | Admitting: Family Medicine

## 2021-12-04 DIAGNOSIS — E1169 Type 2 diabetes mellitus with other specified complication: Secondary | ICD-10-CM

## 2021-12-04 MED ORDER — OZEMPIC (0.25 OR 0.5 MG/DOSE) 2 MG/3ML ~~LOC~~ SOPN: 0.5000 mg | PEN_INJECTOR | SUBCUTANEOUS | 1 refills | Status: DC

## 2022-02-01 ENCOUNTER — Encounter: Payer: Self-pay | Admitting: Family Medicine

## 2022-02-01 DIAGNOSIS — U071 COVID-19: Secondary | ICD-10-CM

## 2022-02-01 MED ORDER — NIRMATRELVIR/RITONAVIR (PAXLOVID)TABLET
3.0000 | ORAL_TABLET | Freq: Two times a day (BID) | ORAL | 0 refills | Status: AC
Start: 2022-02-01 — End: 2022-02-06

## 2022-02-08 ENCOUNTER — Encounter: Payer: Self-pay | Admitting: Family Medicine

## 2022-02-08 ENCOUNTER — Ambulatory Visit (INDEPENDENT_AMBULATORY_CARE_PROVIDER_SITE_OTHER): Payer: BC Managed Care – PPO | Admitting: Family Medicine

## 2022-02-08 VITALS — Ht 69.0 in | Wt 265.0 lb

## 2022-02-08 DIAGNOSIS — J0110 Acute frontal sinusitis, unspecified: Secondary | ICD-10-CM

## 2022-02-08 DIAGNOSIS — U099 Post covid-19 condition, unspecified: Secondary | ICD-10-CM

## 2022-02-08 DIAGNOSIS — H1033 Unspecified acute conjunctivitis, bilateral: Secondary | ICD-10-CM | POA: Diagnosis not present

## 2022-02-08 MED ORDER — POLYMYXIN B-TRIMETHOPRIM 10000-0.1 UNIT/ML-% OP SOLN: 1.0000 [drp] | Freq: Four times a day (QID) | OPHTHALMIC | 0 refills | Status: DC

## 2022-02-08 MED ORDER — AMOXICILLIN-POT CLAVULANATE 875-125 MG PO TABS: 1.0000 | ORAL_TABLET | Freq: Two times a day (BID) | ORAL | 0 refills | Status: DC

## 2022-02-08 NOTE — Patient Instructions (Addendum)
   Please schedule a Follow-up Appointment to: Return if symptoms worsen or fail to improve.  If you have any other questions or concerns, please feel free to call the office or send a message through MyChart. You may also schedule an earlier appointment if necessary.  Additionally, you may be receiving a survey about your experience at our office within a few days to 1 week by e-mail or mail. We value your feedback.  Aowyn Rozeboom, DO [facility], [facility] 

## 2022-02-08 NOTE — Progress Notes (Signed)
Virtual Visit via Telephone The purpose of this virtual visit is to provide medical care while limiting exposure to the novel coronavirus (COVID19) for both patient and office staff.  Consent was obtained for phone visit:  Yes.   Answered questions that patient had about telehealth interaction:  Yes.   I discussed the limitations, risks, security and privacy concerns of performing an evaluation and management service by telephone. I also discussed with the patient that there may be a patient responsible charge related to this service. The patient expressed understanding and agreed to proceed.  Patient Location: Home Provider Location: [facility] (Office)  Participants in virtual visit: - Patient: Ashley Hale - CMA: Orinda Kenner, [place] - Provider: Dr Parks Ranger  ---------------------------------------------------------------------- Chief Complaint  Patient presents with   Nasal Congestion    S: Reviewed CMA documentation. 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 Has improved but has persistent sinus drainage and laryngitis hoarse voice now and conjunctivitis symptoms. - Tried OTC mucinex  Denies any fevers, chills, sweats, body ache, shortness of breath, headache, abdominal pain, diarrhea  Past Medical History:  Diagnosis Date   GERD (gastroesophageal reflux disease)    IBS (irritable bowel syndrome)    Obesity    Social History   Tobacco Use   Smoking status: Never   Smokeless tobacco: Never  Vaping Use   Vaping Use: Never used  Substance Use Topics   Alcohol use: No    Alcohol/week: 0.0 standard drinks of alcohol   Drug use: No    Current Outpatient Medications:    albuterol (VENTOLIN HFA) 108 (90  Base) MCG/ACT inhaler, Inhale 2 puffs into the lungs every 4 (four) hours as needed for wheezing or shortness of breath. Prn cough and wheezing, Disp: 18 g, Rfl: 0   amoxicillin-clavulanate (AUGMENTIN) 875-125 MG tablet, Take 1 tablet by mouth 2 (two) times daily., Disp: 20 tablet, Rfl: 0   BLACK COHOSH EXTRACT PO, Take daily by mouth., Disp: , Rfl:    buPROPion (WELLBUTRIN XL) 150 MG 24 hr tablet, Take 1 tablet (150 mg total) by mouth daily., Disp: 90 tablet, Rfl: 3   Cholecalciferol (VITAMIN D3) 50 MCG (2000 UT) TABS, Take 2,000 Units by mouth daily., Disp: , Rfl:    cyclobenzaprine (FLEXERIL) 10 MG tablet, Take 0.5-1 tablets (5-10 mg total) by mouth 3 (three) times daily as needed for muscle spasms. Recommend start at night only, Disp: 30 tablet, Rfl: 2   fluticasone (FLONASE) 50 MCG/ACT nasal spray, Place 2 sprays into both nostrils daily. Use for 4-6 weeks then stop and use seasonally or as needed., Disp: 16 g, Rfl: 3   furosemide (LASIX) 20 MG tablet, Take 1 tablet (20 mg total) by mouth daily., Disp: 30 tablet, Rfl: 0   hydrOXYzine (ATARAX/VISTARIL) 25 MG tablet, Take 1-2 tablets (25-50 mg total) by mouth at bedtime as needed for anxiety (insomnia)., Disp: 30 tablet, Rfl: 0   loratadine (CLARITIN) 10 MG tablet, Take 1 tablet (10 mg total) by mouth daily. Use for 4-6 weeks then stop, and use as needed or seasonally, Disp: 30 tablet, Rfl: 11   meclizine (ANTIVERT) 25 MG tablet, Take 1 tablet (25 mg total) by mouth 3 (three) times daily as needed for dizziness., Disp: 30 tablet, Rfl: 0  metFORMIN (GLUCOPHAGE-XR) 500 MG 24 hr tablet, Take 1 tablet (500 mg total) by mouth daily with breakfast., Disp: 90 tablet, Rfl: 3   mupirocin ointment (BACTROBAN) 2 %, Apply 1 application. topically 2 (two) times daily., Disp: 22 g, Rfl: 1   omeprazole (PRILOSEC) 20 MG capsule, TAKE 1 CAPSULE BY MOUTH TWICE DAILY BEFORE A MEAL, Disp: 180 capsule, Rfl: 1   OZEMPIC, 0.25 OR 0.5 MG/DOSE, 2 MG/3ML SOPN, Inject 0.5 mg  into the skin once a week. Start with 0.1m weekly x 4 weeks then increase to 0.574mweekly injection., Disp: 9 mL, Rfl: 1   PARoxetine (PAXIL) 20 MG tablet, Take 1 tablet (20 mg total) by mouth daily., Disp: 90 tablet, Rfl: 3   rOPINIRole (REQUIP) 0.5 MG tablet, Take 1 in the afternoon and take 1 at bedtime for restless leg, if need may skip afternoon dose and double PM dose., Disp: 180 tablet, Rfl: 3   trimethoprim-polymyxin b (POLYTRIM) ophthalmic solution, Place 1 drop into both eyes every 6 (six) hours. For 1-2 weeks., Disp: 10 mL, Rfl: 0     11/30/2021    4:05 PM 02/11/2020    8:32 AM 11/06/2018    8:19 AM  Depression screen PHQ 2/9  Decreased Interest 2 0 0  Down, Depressed, Hopeless 1 0 0  PHQ - 2 Score 3 0 0  Altered sleeping 0    Tired, decreased energy 2    Change in appetite 0    Feeling bad or failure about yourself  0    Moving slowly or fidgety/restless 0    Suicidal thoughts 0    PHQ-9 Score 5    Difficult doing work/chores Somewhat difficult         11/06/2018    8:20 AM 11/28/2017    4:42 PM  GAD 7 : Generalized Anxiety Score  Nervous, Anxious, on Edge 0 3  Control/stop worrying 0 2  Worry too much - different things 0 2  Trouble relaxing 0 2  Restless 0 2  Easily annoyed or irritable 0 1  Afraid - awful might happen 0 0  Total GAD 7 Score 0 12  Anxiety Difficulty Somewhat difficult Not difficult at all    -------------------------------------------------------------------------- O: No physical exam performed due to remote telephone encounter.  Lab results reviewed.  Recent Results (from the past 2160 hour(s))  TSH     Status: None   Collection Time: 12/01/21  9:21 AM  Result Value Ref Range   TSH 1.55 0.40 - 4.50 mIU/L  Hemoglobin A1c     Status: Abnormal   Collection Time: 12/01/21  9:21 AM  Result Value Ref Range   Hgb A1c MFr Bld 6.6 (H) <5.7 % of total Hgb    Comment: For someone without known diabetes, a hemoglobin A1c value of 6.5% or greater  indicates that they may have  diabetes and this should be confirmed with a follow-up  test. . For someone with known diabetes, a value <7% indicates  that their diabetes is well controlled and a value  greater than or equal to 7% indicates suboptimal  control. A1c targets should be individualized based on  duration of diabetes, age, comorbid conditions, and  other considerations. . Currently, no consensus exists regarding use of hemoglobin A1c for diagnosis of diabetes for children. .    Mean Plasma Glucose 143 mg/dL   eAG (mmol/L) 7.9 mmol/L  Lipid panel     Status: Abnormal   Collection Time: 12/01/21  9:21 AM  Result Value Ref Range   Cholesterol 204 (H) <200 mg/dL   HDL 49 (L) > OR = 50 mg/dL   Triglycerides 141 <150 mg/dL   LDL Cholesterol (Calc) 130 (H) mg/dL (calc)    Comment: Reference range: <100 . Desirable range <100 mg/dL for primary prevention;   <70 mg/dL for patients with CHD or diabetic patients  with > or = 2 CHD risk factors. Marland Kitchen LDL-C is now calculated using the Martin-Hopkins  calculation, which is a validated novel method providing  better accuracy than the Friedewald equation in the  estimation of LDL-C.  Cresenciano Genre et al. Annamaria Helling. 9798;921(19): 2061-2068  (http://education.[employer].com/faq/FAQ164)    Total CHOL/HDL Ratio 4.2 <5.0 (calc)   Non-HDL Cholesterol (Calc) 155 (H) <130 mg/dL (calc)    Comment: For patients with diabetes plus 1 major ASCVD risk  factor, treating to a non-HDL-C goal of <100 mg/dL  (LDL-C of <70 mg/dL) is considered a therapeutic  option.   CBC with Differential/Platelet     Status: None   Collection Time: 12/01/21  9:21 AM  Result Value Ref Range   WBC 5.0 3.8 - 10.8 Thousand/uL   RBC 4.41 3.80 - 5.10 Million/uL   Hemoglobin 12.9 11.7 - 15.5 g/dL   HCT 38.6 35.0 - 45.0 %   MCV 87.5 80.0 - 100.0 fL   MCH 29.3 27.0 - 33.0 pg   MCHC 33.4 32.0 - 36.0 g/dL   RDW 13.0 11.0 - 15.0 %   Platelets 261 140 - 400 Thousand/uL    MPV 9.5 7.5 - 12.5 fL   Neutro Abs 2,900 1,500 - 7,800 cells/uL   Lymphs Abs 1,465 850 - 3,900 cells/uL   Absolute Monocytes 455 200 - 950 cells/uL   Eosinophils Absolute 140 15 - 500 cells/uL   Basophils Absolute 40 0 - 200 cells/uL   Neutrophils Relative % 58 %   Total Lymphocyte 29.3 %   Monocytes Relative 9.1 %   Eosinophils Relative 2.8 %   Basophils Relative 0.8 %  COMPLETE METABOLIC PANEL WITH GFR     Status: Abnormal   Collection Time: 12/01/21  9:21 AM  Result Value Ref Range   Glucose, Bld 117 (H) 65 - 99 mg/dL    Comment: .            Fasting reference interval . For someone without known diabetes, a glucose value between 100 and 125 mg/dL is consistent with prediabetes and should be confirmed with a follow-up test. .    BUN 13 7 - 25 mg/dL   Creat 1.03 0.50 - 1.05 mg/dL   eGFR 61 > OR = 60 mL/min/1.75m    Comment: The eGFR is based on the CKD-EPI 2021 equation. To calculate  the new eGFR from a previous Creatinine or Cystatin C result, go to https://www.kidney.org/professionals/ kdoqi/gfr%5Fcalculator    BUN/Creatinine Ratio NOT APPLICABLE 6 - 22 (calc)   Sodium 141 135 - 146 mmol/L   Potassium 4.2 3.5 - 5.3 mmol/L   Chloride 106 98 - 110 mmol/L   CO2 27 20 - 32 mmol/L   Calcium 9.0 8.6 - 10.4 mg/dL   Total Protein 6.6 6.1 - 8.1 g/dL   Albumin 3.9 3.6 - 5.1 g/dL   Globulin 2.7 1.9 - 3.7 g/dL (calc)   AG Ratio 1.4 1.0 - 2.5 (calc)   Total Bilirubin 0.3 0.2 - 1.2 mg/dL   Alkaline phosphatase (APISO) 100 37 - 153 U/L   AST 15 10 - 35 U/L   ALT 16  6 - 29 U/L    -------------------------------------------------------------------------- A&P:  Problem List Items Addressed This Visit   None Visit Diagnoses     Post-COVID-19 syndrome    -  Primary   Acute non-recurrent frontal sinusitis       Relevant Medications   amoxicillin-clavulanate (AUGMENTIN) 875-125 MG tablet   Acute conjunctivitis of both eyes, unspecified acute conjunctivitis type        Relevant Medications   trimethoprim-polymyxin b (POLYTRIM) ophthalmic solution      Post COVID 19  Completed Paxlovid anti viral course Symptoms >1 week now with predominantly sinusitis and conjunctivitis Will cover with Augmentin course Add Polytrim antibiotic eye drops for conjunctivitis Symptom relief for cough / congestion as needed   Meds ordered this encounter  Medications   trimethoprim-polymyxin b (POLYTRIM) ophthalmic solution    Sig: Place 1 drop into both eyes every 6 (six) hours. For 1-2 weeks.    Dispense:  10 mL    Refill:  0   amoxicillin-clavulanate (AUGMENTIN) 875-125 MG tablet    Sig: Take 1 tablet by mouth 2 (two) times daily.    Dispense:  20 tablet    Refill:  0    Follow-up: - Return in 1 week PRN  Patient verbalizes understanding with the above medical recommendations including the limitation of remote medical advice.  Specific follow-up and call-back criteria were given for patient to follow-up or seek medical care more urgently if needed.   - Time spent in direct consultation with patient on phone: 8 minutes   Nobie Putnam, [facility] 02/08/2022, 11:37 AM

## 2022-02-18 ENCOUNTER — Encounter: Payer: Self-pay | Admitting: Family Medicine

## 2022-02-18 DIAGNOSIS — B379 Candidiasis, unspecified: Secondary | ICD-10-CM

## 2022-02-18 MED ORDER — FLUCONAZOLE 150 MG PO TABS: ORAL_TABLET | ORAL | 0 refills | Status: DC

## 2022-03-08 ENCOUNTER — Encounter: Payer: Self-pay | Admitting: Family Medicine

## 2022-03-08 ENCOUNTER — Ambulatory Visit: Payer: BC Managed Care – PPO | Admitting: Family Medicine

## 2022-03-08 VITALS — BP 129/76 | HR 86 | Ht 69.0 in | Wt 225.4 lb

## 2022-03-08 DIAGNOSIS — E669 Obesity, unspecified: Secondary | ICD-10-CM

## 2022-03-08 DIAGNOSIS — E1169 Type 2 diabetes mellitus with other specified complication: Secondary | ICD-10-CM

## 2022-03-08 MED ORDER — OZEMPIC (1 MG/DOSE) 4 MG/3ML ~~LOC~~ SOPN: 1.0000 mg | PEN_INJECTOR | SUBCUTANEOUS | 1 refills | Status: DC

## 2022-03-08 NOTE — Assessment & Plan Note (Signed)
Dramatic improved weight loss and lifestyle on Ozempic GLP1 Down 40 lbs in 3 months to 225 lbs now A1c had been controlled, CBG improved Defer A1c today mutual agreement Agree to maximize wt loss benefit on GLP1, will order Ozempic 1mg  weekly inj, new rx sent. Follow up 3 months for repeat A1c

## 2022-03-08 NOTE — Progress Notes (Signed)
Subjective:    Patient ID: Ashley Hale, female    DOB: 09/08/1960, 61 y.o.   MRN: [id number]  Ashley Hale is a 61 y.o. female presenting on 03/08/2022 for Weight Check and Diabetes   HPI  Type 2 Diabetes Obesity BMI >33  Last visit 11/30/21 for yearly, we discussed medication management for obesity / weight loss She was started on Ozempic at end of May 2023, weight starting at that time 265 lbs, now she has done major lifestyle diet exercise modifications and portion control, meals are similar but now reduced portion by approximately half per meal. Overall tolerating medication well but does have constipation. Appetite is reduced.  Currently weight down to 225 lbs, or overall 40 lb wt loss in 3 months.  Admits side effect with Constipation.      03/08/2022   10:42 AM 11/30/2021    4:05 PM 02/11/2020    8:32 AM  Depression screen PHQ 2/9  Decreased Interest 0 2 0  Down, Depressed, Hopeless 0 1 0  PHQ - 2 Score 0 3 0  Altered sleeping 0 0   Tired, decreased energy 0 2   Change in appetite 0 0   Feeling bad or failure about yourself  0 0   Trouble concentrating 0    Moving slowly or fidgety/restless 0 0   Suicidal thoughts 0 0   PHQ-9 Score 0 5   Difficult doing work/chores Not difficult at all Somewhat difficult     Social History   Tobacco Use   Smoking status: Never   Smokeless tobacco: Never  Vaping Use   Vaping Use: Never used  Substance Use Topics   Alcohol use: No    Alcohol/week: 0.0 standard drinks of alcohol   Drug use: No    Review of Systems Per HPI unless specifically indicated above     Objective:    BP 129/76   Pulse 86   Ht 5' 9"  (1.753 m)   Wt 225 lb 6.4 oz (102.2 kg)   SpO2 98%   BMI 33.29 kg/m   Wt Readings from Last 3 Encounters:  03/08/22 225 lb 6.4 oz (102.2 kg)  02/08/22 265 lb (120.2 kg)  11/30/21 265 lb (120.2 kg)    Physical Exam Vitals and nursing note reviewed.  Constitutional:      General: She is not in acute  distress.    Appearance: She is well-developed. She is obese. She is not diaphoretic.     Comments: Well-appearing, comfortable, cooperative  HENT:     Head: Normocephalic and atraumatic.  Eyes:     General:        Right eye: No discharge.        Left eye: No discharge.     Conjunctiva/sclera: Conjunctivae normal.  Neck:     Thyroid: No thyromegaly.  Cardiovascular:     Rate and Rhythm: Normal rate and regular rhythm.     Heart sounds: Normal heart sounds. No murmur heard. Pulmonary:     Effort: Pulmonary effort is normal. No respiratory distress.     Breath sounds: Normal breath sounds. No wheezing or rales.  Musculoskeletal:        General: Normal range of motion.     Cervical back: Normal range of motion and neck supple.  Lymphadenopathy:     Cervical: No cervical adenopathy.  Skin:    General: Skin is warm and dry.     Findings: No erythema or rash.  Neurological:     Mental  Status: She is alert and oriented to person, place, and time.  Psychiatric:        Behavior: Behavior normal.     Comments: Well groomed, good eye contact, normal speech and thoughts     Recent Labs    12/01/21 0921  HGBA1C 6.6*     Results for orders placed or performed in visit on 11/30/21  TSH  Result Value Ref Range   TSH 1.55 0.40 - 4.50 mIU/L  Hemoglobin A1c  Result Value Ref Range   Hgb A1c MFr Bld 6.6 (H) <5.7 % of total Hgb   Mean Plasma Glucose 143 mg/dL   eAG (mmol/L) 7.9 mmol/L  Lipid panel  Result Value Ref Range   Cholesterol 204 (H) <200 mg/dL   HDL 49 (L) > OR = 50 mg/dL   Triglycerides 141 <150 mg/dL   LDL Cholesterol (Calc) 130 (H) mg/dL (calc)   Total CHOL/HDL Ratio 4.2 <5.0 (calc)   Non-HDL Cholesterol (Calc) 155 (H) <130 mg/dL (calc)  CBC with Differential/Platelet  Result Value Ref Range   WBC 5.0 3.8 - 10.8 Thousand/uL   RBC 4.41 3.80 - 5.10 Million/uL   Hemoglobin 12.9 11.7 - 15.5 g/dL   HCT 38.6 35.0 - 45.0 %   MCV 87.5 80.0 - 100.0 fL   MCH 29.3 27.0 -  33.0 pg   MCHC 33.4 32.0 - 36.0 g/dL   RDW 13.0 11.0 - 15.0 %   Platelets 261 140 - 400 Thousand/uL   MPV 9.5 7.5 - 12.5 fL   Neutro Abs 2,900 1,500 - 7,800 cells/uL   Lymphs Abs 1,465 850 - 3,900 cells/uL   Absolute Monocytes 455 200 - 950 cells/uL   Eosinophils Absolute 140 15 - 500 cells/uL   Basophils Absolute 40 0 - 200 cells/uL   Neutrophils Relative % 58 %   Total Lymphocyte 29.3 %   Monocytes Relative 9.1 %   Eosinophils Relative 2.8 %   Basophils Relative 0.8 %  COMPLETE METABOLIC PANEL WITH GFR  Result Value Ref Range   Glucose, Bld 117 (H) 65 - 99 mg/dL   BUN 13 7 - 25 mg/dL   Creat 1.03 0.50 - 1.05 mg/dL   eGFR 61 > OR = 60 mL/min/1.25m   BUN/Creatinine Ratio NOT APPLICABLE 6 - 22 (calc)   Sodium 141 135 - 146 mmol/L   Potassium 4.2 3.5 - 5.3 mmol/L   Chloride 106 98 - 110 mmol/L   CO2 27 20 - 32 mmol/L   Calcium 9.0 8.6 - 10.4 mg/dL   Total Protein 6.6 6.1 - 8.1 g/dL   Albumin 3.9 3.6 - 5.1 g/dL   Globulin 2.7 1.9 - 3.7 g/dL (calc)   AG Ratio 1.4 1.0 - 2.5 (calc)   Total Bilirubin 0.3 0.2 - 1.2 mg/dL   Alkaline phosphatase (APISO) 100 37 - 153 U/L   AST 15 10 - 35 U/L   ALT 16 6 - 29 U/L      Assessment & Plan:   Problem List Items Addressed This Visit     Obesity (BMI 30.0-34.9)   Type 2 diabetes mellitus with other specified complication (HCC) - Primary    Dramatic improved weight loss and lifestyle on Ozempic GLP1 Down 40 lbs in 3 months to 225 lbs now A1c had been controlled, CBG improved Defer A1c today mutual agreement Agree to maximize wt loss benefit on GLP1, will order Ozempic 154mweekly inj, new rx sent. Follow up 3 months for repeat A1c  Relevant Medications   OZEMPIC, 1 MG/DOSE, 4 MG/3ML SOPN    Meds ordered this encounter  Medications   OZEMPIC, 1 MG/DOSE, 4 MG/3ML SOPN    Sig: Inject 1 mg into the skin once a week.    Dispense:  9 mL    Refill:  1    Dose increased from 0.41m up to 16mweekly. Please discontinue the previous  order for 0.25/0.88m80men. We will work on PA     Follow up plan: Return in about 3 months (around 06/08/2022) for 3 month DM A1c, Weight .   AleNobie PutnamO [facility] 03/08/2022, 10:30 AM

## 2022-03-08 NOTE — Patient Instructions (Addendum)
Thank you for coming to the office today.  Dose increase of Ozempic from 0.5mg  up to 1mg  weekly. New order sent to [facility], we will work on the authorization and then they should be able to fill it.  We will check the fingerstick sugar next time!   Please schedule a Follow-up Appointment to: Return in about 3 months (around 06/08/2022) for 3 month DM A1c, Weight .  If you have any other questions or concerns, please feel free to call the office or send a message through MyChart. You may also schedule an earlier appointment if necessary.  Additionally, you may be receiving a survey about your experience at our office within a few days to 1 week by e-mail or mail. We value your feedback.  06/10/2022, DO [facility], [facility]

## 2022-03-18 ENCOUNTER — Other Ambulatory Visit: Payer: Self-pay | Admitting: Family Medicine

## 2022-03-18 DIAGNOSIS — K219 Gastro-esophageal reflux disease without esophagitis: Secondary | ICD-10-CM

## 2022-03-19 NOTE — Telephone Encounter (Signed)
Requip dose inconsistent with current med list. Requested Prescriptions  Pending Prescriptions Disp Refills  . omeprazole (PRILOSEC) 20 MG capsule [Pharmacy Med Name: OMEPRAZOLE DR 20 MG CAP] 180 capsule 1    Sig: TAKE 1 CAPSULE BY MOUTH TWICE DAILY BEFORE A MEAL     Gastroenterology: Proton Pump Inhibitors Passed - 03/18/2022  9:45 AM      Passed - Valid encounter within last 12 months    Recent Outpatient Visits          1 week ago Type 2 diabetes mellitus with other specified complication, without long-term current use of insulin (HCC)   [facility] Smitty Cords, DO   1 month ago Post-COVID-19 syndrome   [facility] Althea Charon, Netta Neat, DO   3 months ago Annual physical exam   [facility] [place], Netta Neat, DO   7 months ago BPPV (benign paroxysmal positional vertigo), right   [facility] [place], Netta Neat, DO   10 months ago Acute cystitis without hematuria   [facility] Althea Charon, Netta Neat, DO      Future Appointments            In 3 months Althea Charon, Netta Neat, DO [facility], [facility]           . rOPINIRole (REQUIP) 0.25 MG tablet [Pharmacy Med Name: ROPINIROLE HCL 0.25 MG TAB] 180 tablet     Sig: TAKE 1 OR 2 TABLETS BY MOUTH AT BEDTIME AS NEEDED RESTLESS LEGS     Neurology:  Parkinsonian Agents Passed - 03/18/2022  9:45 AM      Passed - Last BP in normal range    BP Readings from Last 1 Encounters:  03/08/22 129/76         Passed - Last Heart Rate in normal range    Pulse Readings from Last 1 Encounters:  03/08/22 86         Passed - Valid encounter within last 12 months    Recent Outpatient Visits          1 week ago Type 2 diabetes mellitus with other specified complication, without long-term current use of insulin (HCC)   [facility] Smitty Cords, DO   1 month ago Post-COVID-19 syndrome   [facility] [place], Netta Neat, DO   3 months ago Annual physical exam   [facility] [place], Netta Neat, DO   7 months ago BPPV (benign paroxysmal positional vertigo), right   [facility] [place], Netta Neat, DO   10 months ago Acute cystitis without hematuria   [facility] Althea Charon, Netta Neat, DO      Future Appointments            In 3 months Althea Charon, Netta Neat, DO [facility], [facility]

## 2022-05-03 LAB — HM DIABETES EYE EXAM

## 2022-06-07 ENCOUNTER — Other Ambulatory Visit: Payer: Self-pay | Admitting: Family Medicine

## 2022-06-07 DIAGNOSIS — E1169 Type 2 diabetes mellitus with other specified complication: Secondary | ICD-10-CM

## 2022-06-07 NOTE — Telephone Encounter (Signed)
Requested Prescriptions  Pending Prescriptions Disp Refills   OZEMPIC, 1 MG/DOSE, 4 MG/3ML SOPN [Pharmacy Med Name: OZEMPIC (1 MG/DOSE) 4 MG/3ML SUBQ S] 9 mL 1    Sig: INJECT 1MG  SUBCUTANEOUSLY ONCE A WEEK     Endocrinology:  Diabetes - GLP-1 Receptor Agonists - semaglutide Failed - 06/07/2022 11:42 AM      Failed - HBA1C in normal range and within 180 days    Hgb A1c MFr Bld  Date Value Ref Range Status  12/01/2021 6.6 (H) <5.7 % of total Hgb Final    Comment:    For someone without known diabetes, a hemoglobin A1c value of 6.5% or greater indicates that they may have  diabetes and this should be confirmed with a follow-up  test. . For someone with known diabetes, a value <7% indicates  that their diabetes is well controlled and a value  greater than or equal to 7% indicates suboptimal  control. A1c targets should be individualized based on  duration of diabetes, age, comorbid conditions, and  other considerations. . Currently, no consensus exists regarding use of hemoglobin A1c for diagnosis of diabetes for children. .          Passed - Cr in normal range and within 360 days    Creat  Date Value Ref Range Status  12/01/2021 1.03 0.50 - 1.05 mg/dL Final         Passed - Valid encounter within last 6 months    Recent Outpatient Visits           3 months ago Type 2 diabetes mellitus with other specified complication, without long-term current use of insulin [facility])   [facility], DO   3 months ago Post-COVID-19 syndrome   [facility] [place], [place], DO   6 months ago Annual physical exam   [facility], DO   10 months ago BPPV (benign paroxysmal positional vertigo), right   [facility], DO   1 year ago Acute cystitis without hematuria   [facility], Althea Charon, DO       Future Appointments              In 2 weeks Netta Neat, Althea Charon, DO [facility], [facility]

## 2022-06-21 ENCOUNTER — Encounter: Payer: Self-pay | Admitting: Family Medicine

## 2022-06-21 ENCOUNTER — Ambulatory Visit (INDEPENDENT_AMBULATORY_CARE_PROVIDER_SITE_OTHER): Payer: BC Managed Care – PPO | Admitting: Family Medicine

## 2022-06-21 ENCOUNTER — Other Ambulatory Visit: Payer: Self-pay | Admitting: Family Medicine

## 2022-06-21 VITALS — BP 123/68 | HR 80 | Ht 69.0 in | Wt 200.4 lb

## 2022-06-21 DIAGNOSIS — E663 Overweight: Secondary | ICD-10-CM

## 2022-06-21 DIAGNOSIS — E1169 Type 2 diabetes mellitus with other specified complication: Secondary | ICD-10-CM | POA: Diagnosis not present

## 2022-06-21 DIAGNOSIS — E785 Hyperlipidemia, unspecified: Secondary | ICD-10-CM

## 2022-06-21 DIAGNOSIS — Z0000 Encounter for general adult medical examination without abnormal findings: Secondary | ICD-10-CM

## 2022-06-21 LAB — POCT GLYCOSYLATED HEMOGLOBIN (HGB A1C): Hemoglobin A1C: 5.6 % (ref 4.0–5.6)

## 2022-06-21 NOTE — Progress Notes (Signed)
Subjective:    Patient ID: Ashley Hale, female    DOB: [birth year], 61 y.o.   MRN: [id number]  Ashley Hale is a 61 y.o. female presenting on 06/21/2022 for Diabetes and Weight Check   HPI  Type 2 Diabetes Overweight BMI >29  Weight loss 65+ lbs in past 6 months, on lifestyle diet overhaul and Ozempic 1mg  Completely away from regular soda. Occasional has diet soda. - Continues on medication - Today due for A1c Continues on Ozempic 1mg  weekly inj, tolerating well Also on Metformin XR 500mg  daily Side effect of constipation has improved dramatically now.   Health Maintenance: Flu Shot and RSV vaccine updated end of Sept 2023     06/21/2022    8:12 AM 03/08/2022   10:42 AM 11/30/2021    4:05 PM  Depression screen PHQ 2/9  Decreased Interest 1 0 2  Down, Depressed, Hopeless 0 0 1  PHQ - 2 Score 1 0 3  Altered sleeping 1 0 0  Tired, decreased energy 1 0 2  Change in appetite 1 0 0  Feeling bad or failure about yourself  1 0 0  Trouble concentrating 1 0   Moving slowly or fidgety/restless 1 0 0  Suicidal thoughts 0 0 0  PHQ-9 Score 7 0 5  Difficult doing work/chores Not difficult at all Not difficult at all Somewhat difficult    Social History   Tobacco Use   Smoking status: Never   Smokeless tobacco: Never  Vaping Use   Vaping Use: Never used  Substance Use Topics   Alcohol use: No    Alcohol/week: 0.0 standard drinks of alcohol   Drug use: No    Review of Systems Per HPI unless specifically indicated above     Objective:    BP 123/68   Pulse 80   Ht 5\' 9"  (1.753 m)   Wt 200 lb 6.4 oz (90.9 kg)   SpO2 100%   BMI 29.59 kg/m   Wt Readings from Last 3 Encounters:  06/21/22 200 lb 6.4 oz (90.9 kg)  03/08/22 225 lb 6.4 oz (102.2 kg)  02/08/22 265 lb (120.2 kg)    Physical Exam Vitals and nursing note reviewed.  Constitutional:      General: She is not in acute distress.    Appearance: Normal appearance. She is well-developed. She is not diaphoretic.      Comments: Significant weight loss Well-appearing, comfortable, cooperative  HENT:     Head: Normocephalic and atraumatic.  Eyes:     General:        Right eye: No discharge.        Left eye: No discharge.     Conjunctiva/sclera: Conjunctivae normal.  Cardiovascular:     Rate and Rhythm: Normal rate.  Pulmonary:     Effort: Pulmonary effort is normal.  Skin:    General: Skin is warm and dry.     Findings: No erythema or rash.  Neurological:     Mental Status: She is alert and oriented to person, place, and time.  Psychiatric:        Mood and Affect: Mood normal.        Behavior: Behavior normal.        Thought Content: Thought content normal.     Comments: Well groomed, good eye contact, normal speech and thoughts     Diabetic Foot Exam - Simple   Simple Foot Form Diabetic Foot exam was performed with the following findings: Yes 06/21/2022  8:19 AM  Visual Inspection No deformities, no ulcerations, no other skin breakdown bilaterally: Yes Sensation Testing Intact to touch and monofilament testing bilaterally: Yes Pulse Check Posterior Tibialis and Dorsalis pulse intact bilaterally: Yes Comments      Results for orders placed or performed in visit on 06/21/22  POCT HgB A1C  Result Value Ref Range   Hemoglobin A1C 5.6 4.0 - 5.6 %      Assessment & Plan:   Problem List Items Addressed This Visit     Overweight (BMI 25.0-29.9)   Type 2 diabetes mellitus with other specified complication (HCC) - Primary    Dramatic improved weight loss and lifestyle on Ozempic GLP1 Down 60+ lbs in 6 months A1c down to 5.6, well controlled Complication with HLD, OSA  Continue Ozempic 1mg  weekly inj, Metformin XR 500 daily, consider reducing metformin in future if no longer needed. Urine microalbumin today      Relevant Orders   POCT HgB A1C (Completed)   Urine Microalbumin w/creat. ratio    No orders of the defined types were placed in this encounter.     Follow up  plan: Return in about 6 months (around 12/21/2022) for 6 month fasting lab only then 1 week later Annual Physical.  Future labs ordered for 12/20/22  02/19/23, DO [facility] 06/21/2022, 8:13 AM

## 2022-06-21 NOTE — Assessment & Plan Note (Addendum)
Dramatic improved weight loss and lifestyle on Ozempic GLP1 Down 60+ lbs in 6 months A1c down to 5.6, well controlled Complication with HLD, OSA  Continue Ozempic 1mg  weekly inj, Metformin XR 500 daily, consider reducing metformin in future if no longer needed. Urine microalbumin today

## 2022-06-21 NOTE — Patient Instructions (Addendum)
Thank you for coming to the office today.  Keep on current dosage Ozempic 1mg  weekly, in future we can adjust if needed.  Recent Labs    12/01/21 0921 06/21/22 0814  HGBA1C 6.6* 5.6   All other meds are good through 11/2022  DUE for FASTING BLOOD WORK (no food or drink after midnight before the lab appointment, only water or coffee without cream/sugar on the morning of)  SCHEDULE "Lab Only" visit in the morning at the clinic for lab draw in 6 MONTHS   - Make sure Lab Only appointment is at about 1 week before your next appointment, so that results will be available  For Lab Results, once available within 2-3 days of blood draw, you can can log in to MyChart online to view your results and a brief explanation. Also, we can discuss results at next follow-up visit.    Please schedule a Follow-up Appointment to: Return in about 6 months (around 12/21/2022) for 6 month fasting lab only then 1 week later Annual Physical.  If you have any other questions or concerns, please feel free to call the office or send a message through MyChart. You may also schedule an earlier appointment if necessary.  Additionally, you may be receiving a survey about your experience at our office within a few days to 1 week by e-mail or mail. We value your feedback.  02/20/2023, DO [facility], [facility]

## 2022-06-22 LAB — MICROALBUMIN / CREATININE URINE RATIO
Creatinine, Urine: 79 mg/dL (ref 20–275)
Microalb Creat Ratio: 4 mcg/mg creat (ref <30)
Microalb, Ur: 0.3 mg/dL

## 2022-07-20 ENCOUNTER — Encounter: Payer: Self-pay | Admitting: Internal Medicine

## 2022-07-20 ENCOUNTER — Ambulatory Visit: Payer: BC Managed Care – PPO | Admitting: Internal Medicine

## 2022-07-20 VITALS — BP 128/78 | HR 89 | Temp 96.9°F | Wt 193.0 lb

## 2022-07-20 DIAGNOSIS — R1111 Vomiting without nausea: Secondary | ICD-10-CM

## 2022-07-20 DIAGNOSIS — R519 Headache, unspecified: Secondary | ICD-10-CM | POA: Diagnosis not present

## 2022-07-20 DIAGNOSIS — R5383 Other fatigue: Secondary | ICD-10-CM

## 2022-07-20 DIAGNOSIS — R630 Anorexia: Secondary | ICD-10-CM | POA: Diagnosis not present

## 2022-07-20 LAB — POC COVID19 BINAXNOW: SARS Coronavirus 2 Ag: NEGATIVE

## 2022-07-20 LAB — POCT INFLUENZA A/B
Influenza A, POC: NEGATIVE
Influenza B, POC: NEGATIVE

## 2022-07-20 MED ORDER — ONDANSETRON 4 MG PO TBDP: 4.0000 mg | ORAL_TABLET | Freq: Three times a day (TID) | ORAL | 0 refills | Status: DC | PRN

## 2022-07-20 NOTE — Progress Notes (Signed)
Subjective:    Patient ID: Ashley Hale, female    DOB: [birth year], 62 y.o.   MRN: [id number]  HPI  Patient presents to clinic today with complaint of fatigue, headache, decreaed appetite and vomiting.  This started 2 days ago.  The headache is mild.  She reports she vomited x 1 yesterday but this is not uncommon with her being on Ozempic.  She denies runny nose, nasal congestion, ear pain, sore throat, cough, shortness of breath, diarrhea.  She denies fever, chills or body aches.  She has not taken anything OTC for symptoms.  She has not had sick contacts that she is aware of.  Review of Systems     Past Medical History:  Diagnosis Date   GERD (gastroesophageal reflux disease)    IBS (irritable bowel syndrome)    Obesity     Current Outpatient Medications  Medication Sig Dispense Refill   albuterol (VENTOLIN HFA) 108 (90 Base) MCG/ACT inhaler Inhale 2 puffs into the lungs every 4 (four) hours as needed for wheezing or shortness of breath. Prn cough and wheezing 18 g 0   BLACK COHOSH EXTRACT PO Take daily by mouth.     buPROPion (WELLBUTRIN XL) 150 MG 24 hr tablet Take 1 tablet (150 mg total) by mouth daily. 90 tablet 3   Cholecalciferol (VITAMIN D3) 50 MCG (2000 UT) TABS Take 2,000 Units by mouth daily.     cyclobenzaprine (FLEXERIL) 10 MG tablet Take 0.5-1 tablets (5-10 mg total) by mouth 3 (three) times daily as needed for muscle spasms. Recommend start at night only 30 tablet 2   fluticasone (FLONASE) 50 MCG/ACT nasal spray Place 2 sprays into both nostrils daily. Use for 4-6 weeks then stop and use seasonally or as needed. 16 g 3   furosemide (LASIX) 20 MG tablet Take 1 tablet (20 mg total) by mouth daily. 30 tablet 0   hydrOXYzine (ATARAX/VISTARIL) 25 MG tablet Take 1-2 tablets (25-50 mg total) by mouth at bedtime as needed for anxiety (insomnia). 30 tablet 0   loratadine (CLARITIN) 10 MG tablet Take 1 tablet (10 mg total) by mouth daily. Use for 4-6 weeks then stop, and use as  needed or seasonally 30 tablet 11   meclizine (ANTIVERT) 25 MG tablet Take 1 tablet (25 mg total) by mouth 3 (three) times daily as needed for dizziness. 30 tablet 0   metFORMIN (GLUCOPHAGE-XR) 500 MG 24 hr tablet Take 1 tablet (500 mg total) by mouth daily with breakfast. 90 tablet 3   mupirocin ointment (BACTROBAN) 2 % Apply 1 application. topically 2 (two) times daily. 22 g 1   omeprazole (PRILOSEC) 20 MG capsule TAKE 1 CAPSULE BY MOUTH TWICE DAILY BEFORE A MEAL 180 capsule 1   OZEMPIC, 1 MG/DOSE, 4 MG/3ML SOPN Inject 1 mg into the skin once a week. 9 mL 1   PARoxetine (PAXIL) 20 MG tablet Take 1 tablet (20 mg total) by mouth daily. 90 tablet 3   rOPINIRole (REQUIP) 0.5 MG tablet Take 1 in the afternoon and take 1 at bedtime for restless leg, if need may skip afternoon dose and double PM dose. 180 tablet 3   trimethoprim-polymyxin b (POLYTRIM) ophthalmic solution Place 1 drop into both eyes every 6 (six) hours. For 1-2 weeks. 10 mL 0   No current facility-administered medications for this visit.    Allergies  Allergen Reactions   Shellfish Allergy Nausea And Vomiting   Sulfa Antibiotics Hives    STOMACH CRAMPS    Family History  Problem Relation Age of Onset   Heart disease Mother    Hypertension Mother    Hyperlipidemia Mother    Heart disease Father    Glaucoma Father    Breast cancer Neg Hx     Social History   Socioeconomic History   Marital status: Married    Spouse name: Not on file   Number of children: Not on file   Years of education: Not on file   Highest education level: Not on file  Occupational History   Not on file  Tobacco Use   Smoking status: Never   Smokeless tobacco: Never  Vaping Use   Vaping Use: Never used  Substance and Sexual Activity   Alcohol use: No    Alcohol/week: 0.0 standard drinks of alcohol   Drug use: No   Sexual activity: Not on file  Other Topics Concern   Not on file  Social History Narrative   Not on file   Social  Determinants of Health   Financial Resource Strain: Not on file  Food Insecurity: Not on file  Transportation Needs: Not on file  Physical Activity: Not on file  Stress: Not on file  Social Connections: Not on file  Intimate Partner Violence: Not on file     Constitutional: Patient reports fatigue and headache.  Denies fever, malaise, or abrupt weight changes.  HEENT: Denies eye pain, eye redness, ear pain, ringing in the ears, wax buildup, runny nose, nasal congestion, bloody nose, or sore throat. Respiratory: Denies difficulty breathing, shortness of breath, cough or sputum production.   Cardiovascular: Denies chest pain, chest tightness, palpitations or swelling in the hands or feet.  Gastrointestinal: Patient reports decreased appetite, vomiting.  Denies abdominal pain, bloating, constipation, diarrhea or blood in the stool.  Musculoskeletal: Denies decrease in range of motion, difficulty with gait, muscle pain or joint pain and swelling.   No other specific complaints in a complete review of systems (except as listed in HPI above).  Objective:   Physical Exam BP 128/78 (BP Location: Left Arm, Patient Position: Sitting, Cuff Size: Normal)   Pulse 89   Temp (!) 96.9 F (36.1 C) (Temporal)   Wt 193 lb (87.5 kg)   SpO2 99%   BMI 28.50 kg/m   Wt Readings from Last 3 Encounters:  06/21/22 200 lb 6.4 oz (90.9 kg)  03/08/22 225 lb 6.4 oz (102.2 kg)  02/08/22 265 lb (120.2 kg)    General: Appears her stated age, overweight, in NAD. Skin: Warm, dry and intact. No rashes noted. HEENT: Head: normal shape and size, no sinus tenderness noted; Eyes: sclera white, no icterus, conjunctiva pink, PERRLA and EOMs intact; Throat/Mouth: Teeth present, mucosa pink and moist, no exudate, lesions or ulcerations noted.  Neck: No adenopathy noted. Cardiovascular: Normal rate and rhythm. S1,S2 noted.  No murmur, rubs or gallops noted.  Pulmonary/Chest: Normal effort and positive vesicular breath  sounds. No respiratory distress. No wheezes, rales or ronchi noted.  Abdomen: Normal bowel sounds.  Musculoskeletal: No difficulty with gait.  Neurological: Alert and oriented.    BMET    Component Value Date/Time   NA 141 12/01/2021 0921   K 4.2 12/01/2021 0921   CL 106 12/01/2021 0921   CO2 27 12/01/2021 0921   GLUCOSE 117 (H) 12/01/2021 0921   BUN 13 12/01/2021 0921   CREATININE 1.03 12/01/2021 0921   CALCIUM 9.0 12/01/2021 0921   GFRNONAA 59 (L) 11/03/2018 0837   GFRAA 68 11/03/2018 0837    Lipid Panel  Component Value Date/Time   CHOL 204 (H) 12/01/2021 0921   TRIG 141 12/01/2021 0921   HDL 49 (L) 12/01/2021 0921   CHOLHDL 4.2 12/01/2021 0921   VLDL 31 09/09/2015 0745   LDLCALC 130 (H) 12/01/2021 0921    CBC    Component Value Date/Time   WBC 5.0 12/01/2021 0921   RBC 4.41 12/01/2021 0921   HGB 12.9 12/01/2021 0921   HCT 38.6 12/01/2021 0921   PLT 261 12/01/2021 0921   MCV 87.5 12/01/2021 0921   MCH 29.3 12/01/2021 0921   MCHC 33.4 12/01/2021 0921   RDW 13.0 12/01/2021 0921   LYMPHSABS 1,465 12/01/2021 0921   MONOABS 1.0 (H) 07/25/2017 1713   EOSABS 140 12/01/2021 0921   BASOSABS 40 12/01/2021 0921    Hgb A1C Lab Results  Component Value Date   HGBA1C 5.6 06/21/2022            Assessment & Plan:   Fatigue, Headache, Decreased Appetite and Vomiting:  Rapid flu negative Rapid COVID negative Encourage rest and fluids Rx for Zofran 4 mg every 8 hours as needed for nausea vomiting  Follow-up with your PCP as previously scheduled Webb Silversmith, NP

## 2022-07-20 NOTE — Patient Instructions (Signed)
Vomiting, Adult Vomiting is when stomach contents forcefully come out of the mouth. Many people notice nausea before vomiting. Vomiting can make you feel weak and cause you to become dehydrated. Dehydration can make you feel tired and thirsty, cause you to have a dry mouth, and decrease how often you urinate. Older adults and people who have other diseases or a weak body defense system (immune system) are at higher risk for dehydration. It is important to treat vomiting as told by your health care provider. Follow these instructions at home:  Watch your symptoms for any changes. Tell your health care provider about them. Eating and drinking     Follow these recommendations as told by your health care provider: Take an oral rehydration solution (ORS). This is a drink that is sold at pharmacies and retail stores. Eat bland, easy-to-digest foods in small amounts as you are able. These foods include bananas, applesauce, rice, lean meats, toast, and crackers. Drink clear fluids slowly and in small amounts as you are able. Clear fluids include water, ice chips, low-calorie sports drinks, and fruit juice that has water added (diluted fruit juice). Avoid drinking fluids that contain a lot of sugar or caffeine, such as energy drinks, sports drinks, and soda. Avoid alcohol. Avoid spicy or fatty foods.  General instructions Wash your hands often using soap and water for at least 20 seconds. If soap and water are not available, use hand sanitizer. Make sure that everyone in your household washes their hands frequently. Take over-the-counter and prescription medicines only as told by your health care provider. Rest at home while you recover. Watch your condition for any changes. Keep all follow-up visits. This is important. Contact a health care provider if: Your vomiting gets worse. You have new symptoms. You have a fever. You cannot drink fluids without vomiting. You feel light-headed or  dizzy. You have a headache. You have muscle cramps. You have a rash. You have pain while urinating. Get help right away if: You have pain in your chest, neck, arm, or jaw. Your heart is beating very quickly. You have trouble breathing or you are breathing very quickly. You feel extremely weak or you faint. Your skin feels cold and clammy. You feel confused. You have persistent vomiting. You have vomit that is bright red or looks like black coffee grounds. You have stools (feces) that are bloody or black, or stools that look like tar. You have a severe headache, a stiff neck, or both. You have severe pain, cramping, or bloating in your abdomen. You have signs of dehydration, such as: Dark urine, very little urine, or no urine. Cracked lips. Dry mouth. Sunken eyes. Sleepiness. Weakness. These symptoms may be an emergency. Get help right away. Call 911. Do not wait to see if the symptoms will go away. Do not drive yourself to the hospital. Summary Vomiting is when stomach contents forcefully come out of the mouth. Vomiting can cause you to become dehydrated. It is important to treat vomiting as told by your health care provider. Follow your health care provider's instructions about eating and drinking. Wash your hands often using soap and water for at least 20 seconds. If soap and water are not available, use hand sanitizer. Watch your condition for any changes and for signs of dehydration. Keep all follow-up visits. This is important. This information is not intended to replace advice given to you by your health care provider. Make sure you discuss any questions you have with your health care provider.   Document Revised: 01/09/2021 Document Reviewed: 01/09/2021 Elsevier Patient Education  2023 Elsevier Inc.  

## 2022-08-12 ENCOUNTER — Encounter: Payer: Self-pay | Admitting: Family Medicine

## 2022-08-12 DIAGNOSIS — B379 Candidiasis, unspecified: Secondary | ICD-10-CM

## 2022-08-12 DIAGNOSIS — J0110 Acute frontal sinusitis, unspecified: Secondary | ICD-10-CM

## 2022-08-12 MED ORDER — FLUCONAZOLE 150 MG PO TABS: ORAL_TABLET | ORAL | 0 refills | Status: DC

## 2022-08-12 MED ORDER — AMOXICILLIN-POT CLAVULANATE 875-125 MG PO TABS: 1.0000 | ORAL_TABLET | Freq: Two times a day (BID) | ORAL | 0 refills | Status: DC

## 2022-08-25 ENCOUNTER — Ambulatory Visit: Payer: BC Managed Care – PPO | Admitting: Podiatry

## 2022-08-25 ENCOUNTER — Encounter: Payer: Self-pay | Admitting: Podiatry

## 2022-08-25 ENCOUNTER — Ambulatory Visit (INDEPENDENT_AMBULATORY_CARE_PROVIDER_SITE_OTHER): Payer: BC Managed Care – PPO

## 2022-08-25 ENCOUNTER — Other Ambulatory Visit: Payer: Self-pay | Admitting: Podiatry

## 2022-08-25 DIAGNOSIS — M79672 Pain in left foot: Secondary | ICD-10-CM | POA: Diagnosis not present

## 2022-08-25 DIAGNOSIS — T847XXS Infection and inflammatory reaction due to other internal orthopedic prosthetic devices, implants and grafts, sequela: Secondary | ICD-10-CM

## 2022-08-25 DIAGNOSIS — M2042 Other hammer toe(s) (acquired), left foot: Secondary | ICD-10-CM

## 2022-08-25 MED ORDER — DOXYCYCLINE HYCLATE 100 MG PO TABS: 100.0000 mg | ORAL_TABLET | Freq: Two times a day (BID) | ORAL | 0 refills | Status: DC

## 2022-08-25 NOTE — Progress Notes (Signed)
She presents today chief complaint of third toe right foot she says I had an injury October 2022 where I dropped a board on the toe that you corrected.  She states that Dr. Amalia Hailey saw her at that time gave her an antibiotic which she tolerated well states that the toe has been good for a while then it become red and sore once again.  The nail is exquisitely painful.  States that she dropped the board directly on the end of the toe.  States that Dr. Amalia Hailey had told her that she broke a portion of the bone off.  Objective: Vital signs are stable alert oriented x 3.  Pulses are palpable.  Third toe right foot does demonstrate erythema and edema with tenderness on palpation of the nail plate.  Radiographs taken today do demonstrate a small fracture of the tuft of the distal phalanx is broken and the screw is visible sitting high dorsally on the lateral radiograph.  Assessment: Mild cellulitic process with painful internal fixation.  Plan: Consented her today for removal of screw third digit right foot.  She like to have this done as soon as possible.  We consented her and did discuss possible postop complications which may include but not limited to postop pain bleeding swell infection recurrence need for further surgery.  I did explain to her how hard sometimes the screws are to get out she understands and is amenable to it.  We are going to start her on doxycycline for the current cellulitic infection and I like to have this taken care of prior to surgical intervention.

## 2022-08-26 ENCOUNTER — Telehealth: Payer: Self-pay | Admitting: Urology

## 2022-08-26 NOTE — Telephone Encounter (Signed)
DOS - 09/03/22  REMOVAL FIXATION DEEP 3RD RIGHT --- 20680  [employer] EFFECTIVE DATE 07/19/21  SPOKE WITH RAVEN P. WITH [employer] AND SHE STATED THAT FOR CPT CODE 66599 NO PRIOR AUTH IS REQUIRED.  REF # RAVEN P. 08/26/22 AT 11:36 AM EST

## 2022-08-30 ENCOUNTER — Encounter: Payer: Self-pay | Admitting: Family Medicine

## 2022-08-30 DIAGNOSIS — E1169 Type 2 diabetes mellitus with other specified complication: Secondary | ICD-10-CM

## 2022-08-31 MED ORDER — OZEMPIC (2 MG/DOSE) 8 MG/3ML ~~LOC~~ SOPN: 2.0000 mg | PEN_INJECTOR | SUBCUTANEOUS | 1 refills | Status: DC

## 2022-09-01 ENCOUNTER — Other Ambulatory Visit: Payer: Self-pay | Admitting: Podiatry

## 2022-09-01 MED ORDER — CEPHALEXIN 500 MG PO CAPS: 500.0000 mg | ORAL_CAPSULE | Freq: Three times a day (TID) | ORAL | 0 refills | Status: DC

## 2022-09-01 MED ORDER — ONDANSETRON HCL 4 MG PO TABS: 4.0000 mg | ORAL_TABLET | Freq: Three times a day (TID) | ORAL | 0 refills | Status: DC | PRN

## 2022-09-01 MED ORDER — HYDROCODONE-ACETAMINOPHEN 10-325 MG PO TABS: 1.0000 | ORAL_TABLET | Freq: Four times a day (QID) | ORAL | 0 refills | Status: AC | PRN

## 2022-09-03 ENCOUNTER — Encounter: Payer: Self-pay | Admitting: *Deleted

## 2022-09-03 DIAGNOSIS — Z4889 Encounter for other specified surgical aftercare: Secondary | ICD-10-CM

## 2022-09-06 ENCOUNTER — Other Ambulatory Visit: Payer: Self-pay | Admitting: Family Medicine

## 2022-09-06 DIAGNOSIS — F4321 Adjustment disorder with depressed mood: Secondary | ICD-10-CM

## 2022-09-08 ENCOUNTER — Ambulatory Visit (INDEPENDENT_AMBULATORY_CARE_PROVIDER_SITE_OTHER): Payer: BC Managed Care – PPO

## 2022-09-08 ENCOUNTER — Encounter: Payer: Self-pay | Admitting: Podiatry

## 2022-09-08 ENCOUNTER — Ambulatory Visit (INDEPENDENT_AMBULATORY_CARE_PROVIDER_SITE_OTHER): Payer: BC Managed Care – PPO | Admitting: Podiatry

## 2022-09-08 DIAGNOSIS — T847XXS Infection and inflammatory reaction due to other internal orthopedic prosthetic devices, implants and grafts, sequela: Secondary | ICD-10-CM

## 2022-09-08 DIAGNOSIS — Z9889 Other specified postprocedural states: Secondary | ICD-10-CM

## 2022-09-08 DIAGNOSIS — M79671 Pain in right foot: Secondary | ICD-10-CM

## 2022-09-08 MED ORDER — AMOXICILLIN-POT CLAVULANATE 875-125 MG PO TABS: 1.0000 | ORAL_TABLET | Freq: Two times a day (BID) | ORAL | 0 refills | Status: DC

## 2022-09-08 NOTE — Progress Notes (Signed)
She presents today for her first postop visit date of surgery was 2/16 2024 removal of screw third toe right foot.  States that the knot really sore has not been too bad at all.  Denies fever chills nausea vomit muscle aches pains.  She denies shortness of breath.  Denies calf pain.  Objective: Dry sterile dressing intact once removed demonstrates moderate erythema around the toe sutures are intact except for one suture that was pulled out when the dressing was taken off.  Otherwise the toe is red no purulence no malodor minimal tenderness on attempted exsanguination of any purulence.  Assessment: Well-healing surgical toe x 1 week.  Plan: I will switch her over to Augmentin rather than Keflex.  I redressed the foot today dressed a compressive dressing we will follow-up with her in 1 week to make sure she is doing well.  Probably will leave sutures in for another week or so after that.

## 2022-09-08 NOTE — Telephone Encounter (Signed)
Unable to refill per protocol, Rx request is too soon. Last refill 5/815/23 for 90 days and 3 refills.  Requested Prescriptions  Pending Prescriptions Disp Refills   buPROPion (WELLBUTRIN XL) 150 MG 24 hr tablet [Pharmacy Med Name: BUPROPION HCL ER (XL) 150 MG TAB] 90 tablet 3    Sig: TAKE 1 TABLET BY MOUTH ONCE DAILY     Psychiatry: Antidepressants - bupropion Passed - 09/06/2022  4:03 PM      Passed - Cr in normal range and within 360 days    Creat  Date Value Ref Range Status  12/01/2021 1.03 0.50 - 1.05 mg/dL Final   Creatinine, Urine  Date Value Ref Range Status  06/21/2022 79 20 - 275 mg/dL Final         Passed - AST in normal range and within 360 days    AST  Date Value Ref Range Status  12/01/2021 15 10 - 35 U/L Final         Passed - ALT in normal range and within 360 days    ALT  Date Value Ref Range Status  12/01/2021 16 6 - 29 U/L Final         Passed - Last BP in normal range    BP Readings from Last 1 Encounters:  07/20/22 128/78         Passed - Valid encounter within last 6 months    Recent Outpatient Visits           1 month ago Decreased appetite   [facility] [place], [place], NP   2 months ago Type 2 diabetes mellitus with other specified complication, without long-term current use of insulin [facility])   [place], DO   6 months ago Type 2 diabetes mellitus with other specified complication, without long-term current use of insulin [facility])   [place], DO   7 months ago Post-COVID-19 syndrome   [facility] [place] Hauser, DO   9 months ago Annual physical exam   [facility] [place], Devonne Doughty, DO       Future Appointments             In 3 months [place], Devonne Doughty, [facility], [facility]

## 2022-09-15 ENCOUNTER — Encounter: Payer: Self-pay | Admitting: Podiatry

## 2022-09-15 ENCOUNTER — Ambulatory Visit (INDEPENDENT_AMBULATORY_CARE_PROVIDER_SITE_OTHER): Payer: BC Managed Care – PPO | Admitting: Podiatry

## 2022-09-15 DIAGNOSIS — T847XXS Infection and inflammatory reaction due to other internal orthopedic prosthetic devices, implants and grafts, sequela: Secondary | ICD-10-CM

## 2022-09-15 DIAGNOSIS — Z9889 Other specified postprocedural states: Secondary | ICD-10-CM | POA: Diagnosis not present

## 2022-09-15 MED ORDER — AMOXICILLIN-POT CLAVULANATE 875-125 MG PO TABS: 1.0000 | ORAL_TABLET | Freq: Two times a day (BID) | ORAL | 0 refills | Status: DC

## 2022-09-15 NOTE — Progress Notes (Signed)
She presents today date of surgery 09/03/2018 for removal screw third toe right foot states that the toe has been draining some clear fluid but when we unwrapped it today it was bleeding from the top.  Objective: Vital signs are stable alert and oriented x 3.  The toe appears to be much decrease in its erythema.  It is bleeding dorsally but minimally so is just a slight ooze from where that the most proximal stitch came out last week.  It appears to be slowly getting better.  Assessment: Healing toe  Screw was removed third right foot.  Plan: Redressed the toe today dressed a compressive dressing in place Steri-Strips.  Also recommended a sed rate and a CBC.  The requisition was provided to her and she will have that done today.  I refilled her Augmentin as well.

## 2022-09-16 ENCOUNTER — Telehealth: Payer: Self-pay | Admitting: *Deleted

## 2022-09-16 ENCOUNTER — Encounter: Payer: Self-pay | Admitting: Podiatry

## 2022-09-16 DIAGNOSIS — T148XXA Other injury of unspecified body region, initial encounter: Secondary | ICD-10-CM

## 2022-09-16 LAB — CBC WITH DIFFERENTIAL/PLATELET
Basophils Absolute: 0 10*3/uL (ref 0.0–0.2)
Basos: 1 %
EOS (ABSOLUTE): 0.2 10*3/uL (ref 0.0–0.4)
Eos: 4 %
Hematocrit: 35.7 % (ref 34.0–46.6)
Hemoglobin: 11.7 g/dL (ref 11.1–15.9)
Immature Grans (Abs): 0 10*3/uL (ref 0.0–0.1)
Immature Granulocytes: 0 %
Lymphocytes Absolute: 1.9 10*3/uL (ref 0.7–3.1)
Lymphs: 36 %
MCH: 28.4 pg (ref 26.6–33.0)
MCHC: 32.8 g/dL (ref 31.5–35.7)
MCV: 87 fL (ref 79–97)
Monocytes Absolute: 0.4 10*3/uL (ref 0.1–0.9)
Monocytes: 8 %
Neutrophils Absolute: 2.7 10*3/uL (ref 1.4–7.0)
Neutrophils: 51 %
Platelets: 281 10*3/uL (ref 150–450)
RBC: 4.12 x10E6/uL (ref 3.77–5.28)
RDW: 12.6 % (ref 11.7–15.4)
WBC: 5.3 10*3/uL (ref 3.4–10.8)

## 2022-09-16 LAB — SEDIMENTATION RATE: Sed Rate: 14 mm/hr (ref 0–40)

## 2022-09-16 MED ORDER — MUPIROCIN 2 % EX OINT: 1.0000 | TOPICAL_OINTMENT | Freq: Two times a day (BID) | CUTANEOUS | 1 refills | Status: DC

## 2022-09-16 NOTE — Telephone Encounter (Signed)
Called patient to update that blood work looks great ,no answer, left this as a voice message.

## 2022-09-16 NOTE — Telephone Encounter (Signed)
-----   Message from Rip Harbour, [facility] sent at 09/16/2022 10:23 AM EST -----  ----- Message ----- From: Cedric Fishman Sent: 09/16/2022   9:13 AM EST To: Avanell Shackleton Prevette, PMAC  You can also let her know that her blood work looks great.

## 2022-09-22 ENCOUNTER — Encounter: Payer: Self-pay | Admitting: Podiatry

## 2022-09-22 ENCOUNTER — Ambulatory Visit (INDEPENDENT_AMBULATORY_CARE_PROVIDER_SITE_OTHER): Payer: BC Managed Care – PPO | Admitting: Podiatry

## 2022-09-22 VITALS — BP 119/75 | HR 85

## 2022-09-22 DIAGNOSIS — T148XXA Other injury of unspecified body region, initial encounter: Secondary | ICD-10-CM

## 2022-09-22 DIAGNOSIS — Z9889 Other specified postprocedural states: Secondary | ICD-10-CM

## 2022-09-22 DIAGNOSIS — T847XXS Infection and inflammatory reaction due to other internal orthopedic prosthetic devices, implants and grafts, sequela: Secondary | ICD-10-CM

## 2022-09-22 NOTE — Progress Notes (Signed)
She presents today for follow-up of her surgical removal of the screw to her entire right toe.  Denies fever chills nausea vomit muscle aches and pains.  Objective: Vital signs stable alert oriented x 3.  Pulses are palpable.  Dressed her dressing intact was removed demonstrates sutures are intact margins well coapted.  Much decrease in erythema and drainage from previous evaluation.  Sutures are intact margins are well coapted I did remove sutures today there is 1 small area of wound from her previous abscess site.  This is slowly healing and does not demonstrate any purulence no tracking to the bone.  Assessment: Slowly healing surgical toe third right.  Plan: Redressed today dressed a compressive dressing removed sutures demonstrated to her dressing and I will follow-up with her in 1 week.  She will continue her antibiotics.

## 2022-09-27 ENCOUNTER — Encounter: Payer: Self-pay | Admitting: Family Medicine

## 2022-09-29 ENCOUNTER — Encounter: Payer: Self-pay | Admitting: Podiatry

## 2022-09-29 ENCOUNTER — Encounter: Payer: BC Managed Care – PPO | Admitting: Podiatry

## 2022-09-29 ENCOUNTER — Ambulatory Visit (INDEPENDENT_AMBULATORY_CARE_PROVIDER_SITE_OTHER): Payer: BC Managed Care – PPO | Admitting: Podiatry

## 2022-09-29 DIAGNOSIS — T847XXS Infection and inflammatory reaction due to other internal orthopedic prosthetic devices, implants and grafts, sequela: Secondary | ICD-10-CM

## 2022-09-29 DIAGNOSIS — Z9889 Other specified postprocedural states: Secondary | ICD-10-CM

## 2022-09-29 DIAGNOSIS — T148XXA Other injury of unspecified body region, initial encounter: Secondary | ICD-10-CM

## 2022-09-29 MED ORDER — AMOXICILLIN-POT CLAVULANATE 875-125 MG PO TABS: 1.0000 | ORAL_TABLET | Freq: Two times a day (BID) | ORAL | 0 refills | Status: DC

## 2022-09-29 NOTE — Progress Notes (Signed)
She presents today for follow-up of her ulcerative lesion status post second toe screw removal.  The toe is red and swollen and painful.  She continues to soak it occasionally and dress it daily.  Objective: Pulses are palpable.  Presents in tennis shoes today.  She does have some granulation tissue to the proximalmost aspect of the incision site.  The toe is moderately erythematous no purulence no malodor.  Assessment: Infected toe cannot rule out osteomyelitis second digit of the left foot.  Plan: Discussed etiology pathology and surgical therapies at this point we will start Iodosorb dressing I did place silver nitrate on the granular tissue.  I like to follow-up with her in 1 week

## 2022-10-06 ENCOUNTER — Encounter: Payer: Self-pay | Admitting: Podiatry

## 2022-10-06 ENCOUNTER — Ambulatory Visit (INDEPENDENT_AMBULATORY_CARE_PROVIDER_SITE_OTHER): Payer: BC Managed Care – PPO | Admitting: Podiatry

## 2022-10-06 VITALS — BP 117/71 | HR 84

## 2022-10-06 DIAGNOSIS — T847XXS Infection and inflammatory reaction due to other internal orthopedic prosthetic devices, implants and grafts, sequela: Secondary | ICD-10-CM

## 2022-10-06 DIAGNOSIS — Z9889 Other specified postprocedural states: Secondary | ICD-10-CM

## 2022-10-06 DIAGNOSIS — T148XXA Other injury of unspecified body region, initial encounter: Secondary | ICD-10-CM

## 2022-10-06 MED ORDER — AMOXICILLIN-POT CLAVULANATE 875-125 MG PO TABS: 1.0000 | ORAL_TABLET | Freq: Two times a day (BID) | ORAL | 0 refills | Status: DC

## 2022-10-06 NOTE — Progress Notes (Signed)
She presents today for her postop visit she is a status post screw removal third toe right foot secondary to trauma states that is feeling better than it was she states I do not think it is red as it was.  She states that they still bleeds and that it is still draining some.  She continues to use the Iodosorb daily.  Objective: Vital signs are stable she is alert and oriented x 3.  Pulses are palpable.  Right third toe does appear to be less edematous less erythematous though still has granulation tissue to the dorsal aspect through incision.  Does not seem to be probing to bone.  Assessment chronic wound third toe right foot.  Plan: Discussed etiology pathology and surgical therapies at this point I applied silver nitrate to the granulation tissue continue the use of the Iodosorb gel which I redressed with today.  Tolerated this procedure well.  She will follow-up with Dr. Sherryle Lis next week.  If this needs to be surgically repaired Dr. Sherryle Lis will do so.  We also refilled her antibiotics again today.

## 2022-10-13 ENCOUNTER — Ambulatory Visit (INDEPENDENT_AMBULATORY_CARE_PROVIDER_SITE_OTHER): Payer: BC Managed Care – PPO | Admitting: Podiatry

## 2022-10-13 ENCOUNTER — Ambulatory Visit (INDEPENDENT_AMBULATORY_CARE_PROVIDER_SITE_OTHER): Payer: BC Managed Care – PPO

## 2022-10-13 ENCOUNTER — Encounter: Payer: BC Managed Care – PPO | Admitting: Podiatry

## 2022-10-13 DIAGNOSIS — M79671 Pain in right foot: Secondary | ICD-10-CM

## 2022-10-13 DIAGNOSIS — T847XXS Infection and inflammatory reaction due to other internal orthopedic prosthetic devices, implants and grafts, sequela: Secondary | ICD-10-CM | POA: Diagnosis not present

## 2022-10-13 DIAGNOSIS — M2042 Other hammer toe(s) (acquired), left foot: Secondary | ICD-10-CM

## 2022-10-14 ENCOUNTER — Other Ambulatory Visit: Payer: Self-pay

## 2022-10-14 ENCOUNTER — Encounter (HOSPITAL_COMMUNITY): Payer: Self-pay | Admitting: Internal Medicine

## 2022-10-14 ENCOUNTER — Encounter (HOSPITAL_COMMUNITY): Payer: Self-pay

## 2022-10-14 ENCOUNTER — Observation Stay (HOSPITAL_COMMUNITY)
Admission: RE | Admit: 2022-10-14 | Discharge: 2022-10-15 | Disposition: A | Discharge status: Home / Self Care | Payer: BC Managed Care – PPO | Source: Ambulatory Visit | Attending: Family Medicine | Admitting: Family Medicine

## 2022-10-14 DIAGNOSIS — G8929 Other chronic pain: Secondary | ICD-10-CM | POA: Diagnosis present

## 2022-10-14 DIAGNOSIS — L089 Local infection of the skin and subcutaneous tissue, unspecified: Secondary | ICD-10-CM | POA: Diagnosis present

## 2022-10-14 DIAGNOSIS — T847XXA Infection and inflammatory reaction due to other internal orthopedic prosthetic devices, implants and grafts, initial encounter: Secondary | ICD-10-CM

## 2022-10-14 DIAGNOSIS — E663 Overweight: Secondary | ICD-10-CM | POA: Diagnosis present

## 2022-10-14 DIAGNOSIS — L97519 Non-pressure chronic ulcer of other part of right foot with unspecified severity: Secondary | ICD-10-CM | POA: Insufficient documentation

## 2022-10-14 DIAGNOSIS — T3695XA Adverse effect of unspecified systemic antibiotic, initial encounter: Secondary | ICD-10-CM

## 2022-10-14 DIAGNOSIS — E11621 Type 2 diabetes mellitus with foot ulcer: Secondary | ICD-10-CM | POA: Diagnosis not present

## 2022-10-14 DIAGNOSIS — K219 Gastro-esophageal reflux disease without esophagitis: Secondary | ICD-10-CM | POA: Diagnosis present

## 2022-10-14 DIAGNOSIS — G4733 Obstructive sleep apnea (adult) (pediatric): Secondary | ICD-10-CM

## 2022-10-14 DIAGNOSIS — E1169 Type 2 diabetes mellitus with other specified complication: Secondary | ICD-10-CM | POA: Diagnosis present

## 2022-10-14 LAB — CBC
HCT: 39.6 % (ref 36.0–46.0)
Hemoglobin: 13.1 g/dL (ref 12.0–15.0)
MCH: 29.2 pg (ref 26.0–34.0)
MCHC: 33.1 g/dL (ref 30.0–36.0)
MCV: 88.4 fL (ref 80.0–100.0)
Platelets: 245 10*3/uL (ref 150–400)
RBC: 4.48 MIL/uL (ref 3.87–5.11)
RDW: 12.6 % (ref 11.5–15.5)
WBC: 5.6 10*3/uL (ref 4.0–10.5)
nRBC: 0 % (ref 0.0–0.2)

## 2022-10-14 LAB — GLUCOSE, CAPILLARY
Glucose-Capillary: 100 mg/dL — ABNORMAL HIGH (ref 70–99)
Glucose-Capillary: 89 mg/dL (ref 70–99)

## 2022-10-14 LAB — HIV ANTIBODY (ROUTINE TESTING W REFLEX): HIV Screen 4th Generation wRfx: NONREACTIVE

## 2022-10-14 LAB — CREATININE, SERUM
Creatinine, Ser: 0.95 mg/dL (ref 0.44–1.00)
GFR, Estimated: >60 mL/min (ref >60)

## 2022-10-14 MED ORDER — LORATADINE 10 MG PO TABS: 10.0000 mg | ORAL_TABLET | Freq: Every day | ORAL | Status: DC

## 2022-10-14 MED ORDER — ONDANSETRON HCL 4 MG/2ML IJ SOLN: 4.0000 mg | Freq: Four times a day (QID) | INTRAMUSCULAR | Status: DC | PRN

## 2022-10-14 MED ORDER — HYDROXYZINE HCL 25 MG PO TABS: 25.0000 mg | ORAL_TABLET | Freq: Every evening | ORAL | Status: DC | PRN

## 2022-10-14 MED ORDER — FUROSEMIDE 40 MG PO TABS: 20.0000 mg | ORAL_TABLET | Freq: Every day | ORAL | Status: DC

## 2022-10-14 MED ORDER — INSULIN ASPART 100 UNIT/ML IJ SOLN: 0.0000 [IU] | Freq: Three times a day (TID) | INTRAMUSCULAR | Status: DC

## 2022-10-14 MED ORDER — INSULIN ASPART 100 UNIT/ML IJ SOLN: 0.0000 [IU] | Freq: Every day | INTRAMUSCULAR | Status: DC

## 2022-10-14 MED ORDER — BUPROPION HCL ER (XL) 150 MG PO TB24
150.0000 mg | ORAL_TABLET | Freq: Every day | ORAL | Status: DC
  Administered 2022-10-14: 150 mg via ORAL
  Filled 2022-10-14 (×2): qty 1

## 2022-10-14 MED ORDER — PAROXETINE HCL 20 MG PO TABS
20.0000 mg | ORAL_TABLET | Freq: Every day | ORAL | Status: DC
  Administered 2022-10-14: 20 mg via ORAL
  Filled 2022-10-14: qty 1

## 2022-10-14 MED ORDER — PANTOPRAZOLE SODIUM 40 MG PO TBEC
40.0000 mg | DELAYED_RELEASE_TABLET | Freq: Every day | ORAL | Status: DC
  Administered 2022-10-14: 40 mg via ORAL
  Filled 2022-10-14: qty 1

## 2022-10-14 MED ORDER — MECLIZINE HCL 25 MG PO TABS: 25.0000 mg | ORAL_TABLET | Freq: Three times a day (TID) | ORAL | Status: DC | PRN

## 2022-10-14 MED ORDER — FLUTICASONE PROPIONATE 50 MCG/ACT NA SUSP: 2.0000 | Freq: Every day | NASAL | Status: DC | PRN

## 2022-10-14 MED ORDER — DOCUSATE SODIUM 100 MG PO CAPS
100.0000 mg | ORAL_CAPSULE | Freq: Two times a day (BID) | ORAL | Status: DC
  Administered 2022-10-14: 100 mg via ORAL
  Filled 2022-10-14: qty 1

## 2022-10-14 MED ORDER — ACETAMINOPHEN 325 MG PO TABS: 650.0000 mg | ORAL_TABLET | Freq: Four times a day (QID) | ORAL | Status: DC | PRN

## 2022-10-14 MED ORDER — CYCLOBENZAPRINE HCL 5 MG PO TABS: 5.0000 mg | ORAL_TABLET | Freq: Three times a day (TID) | ORAL | Status: DC | PRN

## 2022-10-14 MED ORDER — TRAZODONE HCL 50 MG PO TABS: 25.0000 mg | ORAL_TABLET | Freq: Every evening | ORAL | Status: DC | PRN

## 2022-10-14 MED ORDER — POLYETHYLENE GLYCOL 3350 17 G PO PACK: 17.0000 g | PACK | Freq: Every day | ORAL | Status: DC | PRN

## 2022-10-14 MED ORDER — ENOXAPARIN SODIUM 40 MG/0.4ML IJ SOSY
40.0000 mg | PREFILLED_SYRINGE | INTRAMUSCULAR | Status: DC
  Administered 2022-10-14: 40 mg via SUBCUTANEOUS
  Filled 2022-10-14: qty 0.4

## 2022-10-14 MED ORDER — HYDRALAZINE HCL 20 MG/ML IJ SOLN: 10.0000 mg | Freq: Four times a day (QID) | INTRAMUSCULAR | Status: DC | PRN

## 2022-10-14 MED ORDER — ACETAMINOPHEN 650 MG RE SUPP: 650.0000 mg | Freq: Four times a day (QID) | RECTAL | Status: DC | PRN

## 2022-10-14 MED ORDER — ONDANSETRON HCL 4 MG PO TABS: 4.0000 mg | ORAL_TABLET | Freq: Four times a day (QID) | ORAL | Status: DC | PRN

## 2022-10-14 NOTE — Progress Notes (Signed)
  Subjective:  Patient ID: Ashley Hale, female    DOB: February 10, 1961,  MRN: [id number]  Chief Complaint  Patient presents with   Routine Post Op    62 y.o. female presents with the above complaint. History confirmed with patient.  Patient returns for follow-up after partial removal of hardware of the right third toe, unfortunately her course has been exacerbated by postoperative infection and delayed wound healing, she feels like it has not gotten better she has been on antibiotics since surgery.  Objective:  Physical Exam: warm, good capillary refill, normal DP and PT pulses, normal sensory exam, and delayed central wound healing right third toe with hypergranular tissue, toe is edematous, erythematous to the MTPJ, tender to touch.  Serous drainage from the wound..   Radiographs: Multiple views x-ray of the right foot:  New radiographs taken today show extensive osteolysis of the middle and distal phalanx, new osteolysis around the proximal threads of the screw in the proximal phalanx Assessment:   1. Internal fixation device (pin, rod, or screw) infection-inflammation, sequela   2. Hammer toe of left foot      Plan:  Patient was evaluated and treated and all questions answered.  We reviewed her radiographs together.  We discussed the continued and worsening infection around the third toe.  We discussed with her that with the presence of the infection, new bony destruction there is high likelihood of osteomyelitis.  We discussed treatment of osteomyelitis including bony resection culture antibiotics and amputation.  With the amount of bony destruction I am seeing in the presence of the retained hardware I think antibiotics and culture are likely are not to be successful.  Amputation should offer a more definitive treatment of the infection.  I do not think I will be able to reasonably remove the remaining hardware without causing significant iatrogenic damage and so I think amputation at the  MTPJ would be best for her.  I also discussed her case with Dr. Milinda Pointer who agrees.  We discussed the risks and possible complications of further wound healing including continued delayed healing and infection as well as digital deformity and transfer lesion.  We attempted to arrange outpatient intervention at the surgical center, but without definitive culture data with this infection they felt to be best from an infectious control standpoint that this be done at the hospital.  I will plan for direct admission to the hospital, will plan for amputation on Friday of the right third toe and she should be able to be discharged home after that weightbearing as tolerated in a postoperative shoe.  Informed consent signed and reviewed.  No follow-ups on file.

## 2022-10-14 NOTE — H&P (Signed)
History and Physical  Ashley Hale [id number] DOB: 1961-06-11 DOA: 10/14/2022  PCP: Olin Hauser, DO   Chief Complaint: Direct Admit for Surgery   HPI: Ashley Hale is a 62 y.o. female with medical history significant for well-controlled diabetes, GERD, obesity being admitted to the hospital for elective hardware removal of the right third toe.  History is provided by the patient, who states that she had hardware placed in that toe several years ago, and was doing well until she unfortunately dropped something on her foot late last year, causing fracture.  She went to the operating room to try and reconstruct/remove the screw and unfortunately broke.  She has been having significant pain since that time, has been on several courses of antibiotics.  Despite this, they feel that is residual infection, she was seen by podiatry in the office yesterday, they feel she needs to go back to the ER to try and completely remove the screw, or have amputation.  Due to difficulty with scheduling at the outpatient center, at the request of her podiatrist she has been directly admitted.  She has some pain in the toe, but denies any fevers, chills, nausea vomiting or any changes to her overall status.  Review of Systems: Please see HPI for pertinent positives and negatives. A complete 10 system review of systems are otherwise negative.  Past Medical History:  Diagnosis Date   GERD (gastroesophageal reflux disease)    IBS (irritable bowel syndrome)    Obesity    Past Surgical History:  Procedure Laterality Date   ABDOMINAL HYSTERECTOMY     BACK SURGERY  1992   Lumbar spine, herniated disc    Social History:  reports that she has never smoked. She has never used smokeless tobacco. She reports that she does not drink alcohol and does not use drugs.   Allergies  Allergen Reactions   Shellfish Allergy Nausea And Vomiting   Sulfa Antibiotics Hives    STOMACH CRAMPS    Family History   Problem Relation Age of Onset   Heart disease Mother    Hypertension Mother    Hyperlipidemia Mother    Heart disease Father    Glaucoma Father    Breast cancer Neg Hx      Prior to Admission medications   Medication Sig Start Date End Date Taking? Authorizing Provider  albuterol (VENTOLIN HFA) 108 (90 Base) MCG/ACT inhaler Inhale 2 puffs into the lungs every 4 (four) hours as needed for wheezing or shortness of breath. Prn cough and wheezing 08/23/21   Rodriguez-Southworth, Sunday Spillers, PA-C  amoxicillin-clavulanate (AUGMENTIN) 875-125 MG tablet Take 1 tablet by mouth 2 (two) times daily. 10/06/22   Hyatt, Max T, DPM  BLACK COHOSH EXTRACT PO Take daily by mouth.    [provider]  buPROPion (WELLBUTRIN XL) 150 MG 24 hr tablet Take 1 tablet (150 mg total) by mouth daily. 11/30/21   Karamalegos, Devonne Doughty, DO  Cholecalciferol (VITAMIN D3) 50 MCG (2000 UT) TABS Take 2,000 Units by mouth daily. 02/11/20   Karamalegos, Devonne Doughty, DO  cyclobenzaprine (FLEXERIL) 10 MG tablet Take 0.5-1 tablets (5-10 mg total) by mouth 3 (three) times daily as needed for muscle spasms. Recommend start at night only 11/28/17   Karamalegos, Devonne Doughty, DO  fluconazole (DIFLUCAN) 150 MG tablet Take one tablet by mouth on Day 1. Repeat dose 2nd tablet on Day 3. 08/12/22   Parks Ranger, Devonne Doughty, DO  fluticasone (FLONASE) 50 MCG/ACT nasal spray Place 2 sprays into both nostrils daily.  Use for 4-6 weeks then stop and use seasonally or as needed. 07/27/21   Karamalegos, Devonne Doughty, DO  furosemide (LASIX) 20 MG tablet Take 1 tablet (20 mg total) by mouth daily. 09/03/19   Karamalegos, Devonne Doughty, DO  hydrOXYzine (ATARAX/VISTARIL) 25 MG tablet Take 1-2 tablets (25-50 mg total) by mouth at bedtime as needed for anxiety (insomnia). 11/28/17   Karamalegos, Devonne Doughty, DO  loratadine (CLARITIN) 10 MG tablet Take 1 tablet (10 mg total) by mouth daily. Use for 4-6 weeks then stop, and use as needed or seasonally 06/23/16    Parks Ranger, Devonne Doughty, DO  meclizine (ANTIVERT) 25 MG tablet Take 1 tablet (25 mg total) by mouth 3 (three) times daily as needed for dizziness. 07/27/21   Karamalegos, Devonne Doughty, DO  mupirocin ointment (BACTROBAN) 2 % Apply 1 Application topically 2 (two) times daily. 09/16/22   Hyatt, Max T, DPM  omeprazole (PRILOSEC) 20 MG capsule TAKE 1 CAPSULE BY MOUTH TWICE DAILY BEFORE A MEAL 03/19/22   Karamalegos, Alexander J, DO  ondansetron (ZOFRAN) 4 MG tablet Take 1 tablet (4 mg total) by mouth every 8 (eight) hours as needed. 09/01/22   Hyatt, Max T, DPM  OZEMPIC, 2 MG/DOSE, 8 MG/3ML SOPN Inject 2 mg into the skin once a week. 08/31/22   Karamalegos, Devonne Doughty, DO  PARoxetine (PAXIL) 20 MG tablet Take 1 tablet (20 mg total) by mouth daily. 11/30/21   Karamalegos, Devonne Doughty, DO  rOPINIRole (REQUIP) 0.5 MG tablet Take 1 in the afternoon and take 1 at bedtime for restless leg, if need may skip afternoon dose and double PM dose. 11/30/21   Olin Hauser, DO    Physical Exam: BP 121/80 (BP Location: Right Arm)   Pulse 86   Temp 98.3 F (36.8 C)   Resp 16   SpO2 100%   General:  Alert, oriented, calm, in no acute distress  Eyes: EOMI, clear conjuctivae, white sclerea Neck: supple, no masses, trachea mildline  Cardiovascular: RRR, no murmurs or rubs, no peripheral edema  Respiratory: clear to auscultation bilaterally, no wheezes, no crackles  Abdomen: soft, nontender, nondistended, normal bowel tones heard  Skin: dry, no rashes  Musculoskeletal: no joint effusions, normal range of motion  Psychiatric: appropriate affect, normal speech  Neurologic: extraocular muscles intact, clear speech, moving all extremities with intact sensorium          Labs on Admission:  Basic Metabolic Panel: No results for input(s): "NA", "K", "CL", "CO2", "GLUCOSE", "BUN", "CREATININE", "CALCIUM", "MG", "PHOS" in the last 168 hours. Liver Function Tests: No results for input(s): "AST", "ALT", "ALKPHOS",  "BILITOT", "PROT", "ALBUMIN" in the last 168 hours. No results for input(s): "LIPASE", "AMYLASE" in the last 168 hours. No results for input(s): "AMMONIA" in the last 168 hours. CBC: No results for input(s): "WBC", "NEUTROABS", "HGB", "HCT", "MCV", "PLT" in the last 168 hours. Cardiac Enzymes: No results for input(s): "CKTOTAL", "CKMB", "CKMBINDEX", "TROPONINI" in the last 168 hours.  BNP (last 3 results) No results for input(s): "BNP" in the last 8760 hours.  ProBNP (last 3 results) No results for input(s): "PROBNP" in the last 8760 hours.  CBG: No results for input(s): "GLUCAP" in the last 168 hours.  Radiological Exams on Admission: DG Foot Complete Right  Result Date: 10/13/2022 Please see detailed radiograph report in office note.   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 -Make n.p.o. at midnight for surgery  Active Problems:   GERD (gastroesophageal reflux disease)   Overweight (BMI 25.0-29.9)   Type 2 diabetes mellitus with other specified complication (HCC)   OSA on CPAP   Hyperlipidemia associated with type 2 diabetes mellitus ([place])   Chronic left-sided low back pain with left-sided sciatica   Diabetic foot infection ([place])  DVT prophylaxis: Lovenox     Code Status: Full Code  Consults called: None, discussed with podiatry earlier today.  Admission status: Observation   Time spent: 49 minutes  Darus Hershman Neva Seat MD [facility] Pager [phone]  If 7PM-7AM, please contact night-coverage [email or web address] Password [email or web address]  10/14/2022, 1:14 PM

## 2022-10-15 ENCOUNTER — Encounter (HOSPITAL_COMMUNITY)
Admission: RE | Disposition: A | Discharge status: Home / Self Care | Payer: Self-pay | Source: Ambulatory Visit | Attending: Internal Medicine

## 2022-10-15 ENCOUNTER — Other Ambulatory Visit: Payer: Self-pay

## 2022-10-15 ENCOUNTER — Observation Stay (HOSPITAL_COMMUNITY): Payer: BC Managed Care – PPO | Admitting: Anesthesiology

## 2022-10-15 ENCOUNTER — Encounter (HOSPITAL_COMMUNITY): Payer: Self-pay | Admitting: Internal Medicine

## 2022-10-15 DIAGNOSIS — T847XXA Infection and inflammatory reaction due to other internal orthopedic prosthetic devices, implants and grafts, initial encounter: Secondary | ICD-10-CM | POA: Diagnosis not present

## 2022-10-15 DIAGNOSIS — L089 Local infection of the skin and subcutaneous tissue, unspecified: Secondary | ICD-10-CM | POA: Diagnosis not present

## 2022-10-15 HISTORY — PX: AMPUTATION TOE: SHX6595

## 2022-10-15 SURGERY — AMPUTATION, TOE
Anesthesia: Monitor Anesthesia Care | Site: Toe | Laterality: Right

## 2022-10-15 MED ORDER — BUPIVACAINE-EPINEPHRINE (PF) 0.5% -1:200000 IJ SOLN
INTRAMUSCULAR | Status: DC | PRN
  Administered 2022-10-15: 25 mL via PERINEURAL

## 2022-10-15 MED ORDER — FENTANYL CITRATE PF 50 MCG/ML IJ SOSY: 25.0000 ug | PREFILLED_SYRINGE | INTRAMUSCULAR | Status: DC | PRN

## 2022-10-15 MED ORDER — LIDOCAINE-EPINEPHRINE (PF) 1.5 %-1:200000 IJ SOLN
INTRAMUSCULAR | Status: DC | PRN
  Administered 2022-10-15: 5 mL via PERINEURAL

## 2022-10-15 MED ORDER — FENTANYL CITRATE PF 50 MCG/ML IJ SOSY
100.0000 ug | PREFILLED_SYRINGE | INTRAMUSCULAR | Status: DC
  Administered 2022-10-15: 50 ug via INTRAVENOUS
  Filled 2022-10-15: qty 2

## 2022-10-15 MED ORDER — CEFAZOLIN SODIUM-DEXTROSE 2-3 GM-%(50ML) IV SOLR
INTRAVENOUS | Status: DC | PRN
  Administered 2022-10-15: 2 g via INTRAVENOUS

## 2022-10-15 MED ORDER — 0.9 % SODIUM CHLORIDE (POUR BTL) OPTIME
TOPICAL | Status: DC | PRN
  Administered 2022-10-15: 1000 mL

## 2022-10-15 MED ORDER — ACETAMINOPHEN 10 MG/ML IV SOLN: 1000.0000 mg | Freq: Once | INTRAVENOUS | Status: DC | PRN

## 2022-10-15 MED ORDER — BUPIVACAINE HCL (PF) 0.5 % IJ SOLN
INTRAMUSCULAR | Status: DC | PRN
  Administered 2022-10-15: 10 mL

## 2022-10-15 MED ORDER — CEFAZOLIN SODIUM-DEXTROSE 2-4 GM/100ML-% IV SOLN
INTRAVENOUS | Status: AC
  Filled 2022-10-15: qty 100

## 2022-10-15 MED ORDER — FLUCONAZOLE 150 MG PO TABS: ORAL_TABLET | ORAL | 0 refills | Status: DC

## 2022-10-15 MED ORDER — MIDAZOLAM HCL 2 MG/2ML IJ SOLN
2.0000 mg | INTRAMUSCULAR | Status: DC
  Administered 2022-10-15: 2 mg via INTRAVENOUS
  Filled 2022-10-15: qty 2

## 2022-10-15 MED ORDER — ACETAMINOPHEN 500 MG PO TABS: 1000.0000 mg | ORAL_TABLET | Freq: Once | ORAL | Status: DC | PRN

## 2022-10-15 MED ORDER — LACTATED RINGERS IV SOLN: INTRAVENOUS | Status: DC

## 2022-10-15 MED ORDER — OXYCODONE-ACETAMINOPHEN 5-325 MG PO TABS: 1.0000 | ORAL_TABLET | Freq: Four times a day (QID) | ORAL | 0 refills | Status: DC | PRN

## 2022-10-15 MED ORDER — PROPOFOL 500 MG/50ML IV EMUL
INTRAVENOUS | Status: DC | PRN
  Administered 2022-10-15: 75 ug/kg/min via INTRAVENOUS

## 2022-10-15 MED ORDER — DOXYCYCLINE HYCLATE 100 MG PO TABS: 100.0000 mg | ORAL_TABLET | Freq: Two times a day (BID) | ORAL | 0 refills | Status: DC

## 2022-10-15 MED ORDER — BUPIVACAINE HCL (PF) 0.5 % IJ SOLN
INTRAMUSCULAR | Status: AC
  Filled 2022-10-15: qty 30

## 2022-10-15 MED ORDER — ACETAMINOPHEN 160 MG/5ML PO SOLN: 1000.0000 mg | Freq: Once | ORAL | Status: DC | PRN

## 2022-10-15 SURGICAL SUPPLY — 36 items
BAG COUNTER SPONGE SURGICOUNT (BAG) IMPLANT
BLADE SURG 15 STRL LF DISP TIS (BLADE) IMPLANT
BLADE SURG 15 STRL SS (BLADE)
BNDG ELASTIC 4X5.8 VLCR STR LF (GAUZE/BANDAGES/DRESSINGS) IMPLANT
BNDG ELASTIC 6INX 5YD STR LF (GAUZE/BANDAGES/DRESSINGS) ×1 IMPLANT
BNDG GAUZE DERMACEA FLUFF 4 (GAUZE/BANDAGES/DRESSINGS) IMPLANT
BNDG STRETCH 4X75 STRL LF (GAUZE/BANDAGES/DRESSINGS) IMPLANT
CLEANER TIP ELECTROSURG 2X2 (MISCELLANEOUS) IMPLANT
CNTNR URN SCR LID CUP LEK RST (MISCELLANEOUS) IMPLANT
CONT SPEC 4OZ STRL OR WHT (MISCELLANEOUS)
COVER SURGICAL LIGHT HANDLE (MISCELLANEOUS) ×1 IMPLANT
CUFF TOURN SGL QUICK 24 (TOURNIQUET CUFF)
CUFF TRNQT CYL 24X4X16.5-23 (TOURNIQUET CUFF) IMPLANT
GAUZE SPONGE 4X4 12PLY STRL (GAUZE/BANDAGES/DRESSINGS) ×1 IMPLANT
GAUZE XEROFORM 1X8 LF (GAUZE/BANDAGES/DRESSINGS) ×1 IMPLANT
GLOVE BIO SURGEON STRL SZ7.5 (GLOVE) ×1 IMPLANT
GLOVE BIOGEL M 7.0 STRL (GLOVE) ×1 IMPLANT
GLOVE BIOGEL PI IND STRL 7.5 (GLOVE) ×1 IMPLANT
GLOVE BIOGEL PI IND STRL 8 (GLOVE) ×1 IMPLANT
GLOVE ECLIPSE 8.0 STRL XLNG CF (GLOVE) ×1 IMPLANT
GOWN STRL REUS W/ TWL XL LVL3 (GOWN DISPOSABLE) ×1 IMPLANT
GOWN STRL REUS W/TWL XL LVL3 (GOWN DISPOSABLE) ×1
KIT BASIN OR (CUSTOM PROCEDURE TRAY) ×1 IMPLANT
NDL HYPO 25X1 1.5 SAFETY (NEEDLE) ×1 IMPLANT
NEEDLE HYPO 25X1 1.5 SAFETY (NEEDLE) ×1 IMPLANT
PACK ORTHO EXTREMITY (CUSTOM PROCEDURE TRAY) ×1 IMPLANT
PADDING UNDERCAST 2X4 STRL (CAST SUPPLIES) ×1 IMPLANT
PENCIL SMOKE EVACUATOR (MISCELLANEOUS) ×1 IMPLANT
SPIKE FLUID TRANSFER (MISCELLANEOUS) IMPLANT
STAPLER VISISTAT 35W (STAPLE) ×1 IMPLANT
SUT ETHILON 4 0 PS 2 18 (SUTURE) ×1 IMPLANT
SUT VIC AB 4-0 PS2 27 (SUTURE) ×1 IMPLANT
SYR 20ML LL LF (SYRINGE) IMPLANT
TOWEL OR 17X26 10 PK STRL BLUE (TOWEL DISPOSABLE) ×1 IMPLANT
TOWEL OR NON WOVEN STRL DISP B (DISPOSABLE) ×1 IMPLANT
UNDERPAD 30X36 HEAVY ABSORB (UNDERPADS AND DIAPERS) ×2 IMPLANT

## 2022-10-15 NOTE — Discharge Summary (Signed)
Physician Discharge Summary  Ashley Hale:[id number] DOB: 1960/09/18 DOA: 10/14/2022  PCP: Smitty Cords, DO  Admit date: 10/14/2022 Discharge date: 10/15/2022  Time spent: 27 minutes  Recommendations for Outpatient Follow-up:  Follow-up with Dr. Lilian Kapur of podiatry  Discharge Diagnoses:  MAIN problem for hospitalization   General amputation and osteomyelitis  Please see below for itemized issues addressed in HOpsital- refer to other progress notes for clarity if needed  Discharge Condition: Improved  Diet recommendation: Diabetic  Filed Weights   10/15/22 0727 10/15/22 0738  Weight: 86.2 kg 86.2 kg    History of present illness:  62 year old high functioning white female known DM TY 2 reflux obesity admitted for elective hardware removal of right third toe-she had a fracture and went to the operating room previously-they try to reconstruct and remove the screw and this broke-was seen by podiatry in the office on 3/27 and directed to the emergency room given the fact that she had failed outpatient antibiotics She underwent the procedure as dictated by Dr. Lilian Kapur on 3/29 (amputation of right third toe) and was discharged home promptly subsequently with minimal pain and ability to ambulate and will follow-up with her PCP and Dr. Lilian Kapur   Discharge Exam: Vitals:   10/15/22 1030 10/15/22 1045  BP: 109/68 108/67  Pulse: 75 74  Resp: (!) 8 (!) 9  Temp:  97.9 F (36.6 C)  SpO2: 99% 99%    Subj on day of d/c   Awake pleasant coherent  General Exam on discharge  EOMI NCAT no focal deficit Chest clear no rales rhonchi wheeze ROM intact Wound not examined  Discharge Instructions   Discharge Instructions     Diet - low sodium heart healthy   Complete by: As directed    Discharge instructions   Complete by: As directed    Please follow with Dr. Lilian Kapur Report high fevers chills etc to Dr. Lilian Kapur   Increase activity slowly   Complete by: As  directed       Allergies as of 10/15/2022       Reactions   Other Other (See Comments)   GAUZE used for dressing = Bleeding and severe "skin stinging"   Shellfish Allergy Nausea And Vomiting   Sulfa Antibiotics Hives, Other (See Comments)   STOMACH CRAMPS, also        Medication List     STOP taking these medications    albuterol 108 (90 Base) MCG/ACT inhaler Commonly known as: VENTOLIN HFA   amoxicillin-clavulanate 875-125 MG tablet Commonly known as: AUGMENTIN   furosemide 20 MG tablet Commonly known as: LASIX   mupirocin ointment 2 % Commonly known as: BACTROBAN       TAKE these medications    BLACK COHOSH EXTRACT PO Take 1 capsule by mouth daily.   buPROPion 150 MG 24 hr tablet Commonly known as: Wellbutrin XL Take 1 tablet (150 mg total) by mouth daily.   cyclobenzaprine 10 MG tablet Commonly known as: FLEXERIL Take 0.5-1 tablets (5-10 mg total) by mouth 3 (three) times daily as needed for muscle spasms. Recommend start at night only What changed: additional instructions   doxycycline 100 MG tablet Commonly known as: VIBRA-TABS Take 1 tablet (100 mg total) by mouth 2 (two) times daily for 10 days.   fluconazole 150 MG tablet Commonly known as: DIFLUCAN Take PRN for yeast infection. Take one tablet by mouth on Day 1. Repeat dose 2nd tablet on Day 3. What changed: additional instructions   fluticasone 50 MCG/ACT nasal  spray Commonly known as: FLONASE Place 2 sprays into both nostrils daily. Use for 4-6 weeks then stop and use seasonally or as needed. What changed:  when to take this reasons to take this additional instructions   Hair/Skin/Nails Caps Take 1 capsule by mouth daily.   hydrOXYzine 25 MG tablet Commonly known as: ATARAX Take 1-2 tablets (25-50 mg total) by mouth at bedtime as needed for anxiety (insomnia).   loratadine 10 MG tablet Commonly known as: CLARITIN Take 1 tablet (10 mg total) by mouth daily. Use for 4-6 weeks then  stop, and use as needed or seasonally   meclizine 25 MG tablet Commonly known as: ANTIVERT Take 1 tablet (25 mg total) by mouth 3 (three) times daily as needed for dizziness.   omeprazole 20 MG capsule Commonly known as: PRILOSEC TAKE 1 CAPSULE BY MOUTH TWICE DAILY BEFORE A MEAL What changed: See the new instructions.   ondansetron 4 MG tablet Commonly known as: Zofran Take 1 tablet (4 mg total) by mouth every 8 (eight) hours as needed. What changed: reasons to take this   oxyCODONE-acetaminophen 5-325 MG tablet Commonly known as: Percocet Take 1 tablet by mouth every 6 (six) hours as needed for severe pain.   Ozempic (2 MG/DOSE) 8 MG/3ML Sopn Generic drug: Semaglutide (2 MG/DOSE) Inject 2 mg into the skin once a week. What changed: when to take this   PARoxetine 20 MG tablet Commonly known as: PAXIL Take 1 tablet (20 mg total) by mouth daily.   rOPINIRole 0.5 MG tablet Commonly known as: REQUIP Take 1 in the afternoon and take 1 at bedtime for restless leg, if need may skip afternoon dose and double PM dose. What changed:  how much to take how to take this when to take this additional instructions   Vitamin D3 50 MCG (2000 UT) Tabs Generic drug: Cholecalciferol Take 2,000 Units by mouth daily.       Allergies  Allergen Reactions   Other Other (See Comments)    GAUZE used for dressing = Bleeding and severe "skin stinging"   Shellfish Allergy Nausea And Vomiting   Sulfa Antibiotics Hives and Other (See Comments)    STOMACH CRAMPS, also      The results of significant diagnostics from this hospitalization (including imaging, microbiology, ancillary and laboratory) are listed below for reference.    Significant Diagnostic Studies: DG Foot Complete Right  Result Date: 10/13/2022 Please see detailed radiograph report in office note.   Microbiology: No results found for this or any previous visit (from the past 240 hour(s)).   Labs: Basic Metabolic  Panel: Recent Labs  Lab 10/14/22 1324  CREATININE 0.95   Liver Function Tests: No results for input(s): "AST", "ALT", "ALKPHOS", "BILITOT", "PROT", "ALBUMIN" in the last 168 hours. No results for input(s): "LIPASE", "AMYLASE" in the last 168 hours. No results for input(s): "AMMONIA" in the last 168 hours. CBC: Recent Labs  Lab 10/14/22 1324  WBC 5.6  HGB 13.1  HCT 39.6  MCV 88.4  PLT 245   Cardiac Enzymes: No results for input(s): "CKTOTAL", "CKMB", "CKMBINDEX", "TROPONINI" in the last 168 hours. BNP: BNP (last 3 results) No results for input(s): "BNP" in the last 8760 hours.  ProBNP (last 3 results) No results for input(s): "PROBNP" in the last 8760 hours.  CBG: Recent Labs  Lab 10/14/22 1705 10/14/22 2056  GLUCAP 100* 89       Signed:  Rhetta Mura MD   [facility] 10/15/2022, 11:12 AM

## 2022-10-15 NOTE — Progress Notes (Signed)
History and Physical Interval Note:  10/15/2022 9:11 AM  Ashley Hale  has presented today for surgery, with the diagnosis of third toe infection right.  The various methods of treatment have been discussed with the patient and family. After consideration of risks, benefits and other options for treatment, the patient has consented to   Procedure(s): AMPUTATION TOE, RIGHT THIRD TOE (Right) as a surgical intervention.  The patient's history has been reviewed, patient examined, no change in status, stable for surgery.  I have reviewed the patient's chart and labs.  Questions were answered to the patient's satisfaction.     [name]

## 2022-10-15 NOTE — Transfer of Care (Signed)
Immediate Anesthesia Transfer of Care Note  Patient: Ashley Hale  Procedure(s) Performed: AMPUTATION TOE, RIGHT THIRD TOE (Right: Toe)  Patient Location: PACU  Anesthesia Type:MAC  Level of Consciousness: awake, alert , and oriented  Airway & Oxygen Therapy: Patient Spontanous Breathing and Patient connected to face mask oxygen  Post-op Assessment: Report given to RN and Post -op Vital signs reviewed and stable  Post vital signs: Reviewed and stable  Last Vitals:  Vitals Value Taken Time  BP 117/66 10/15/22 1015  Temp    Pulse 78 10/15/22 1017  Resp 14 10/15/22 1017  SpO2 100 % 10/15/22 1017  Vitals shown include unvalidated device data.  Last Pain:  Vitals:   10/15/22 0825  TempSrc: Oral  PainSc: 0-No pain         Complications: No notable events documented.

## 2022-10-15 NOTE — Discharge Instructions (Signed)
Post-Surgery Instructions  1. If you are recuperating from surgery anywhere other than home, please be sure to leave [place] a number where you can be reached. 2. Go directly home and rest. 3. The keep operated foot (or feet) elevated six inches above the hip when sitting or lying down. 4. Support the elevated foot and leg with pillows under the calf. DO NOT PLACE PILLOWS UNDER THE KNEE. 5. DO NOT REMOVE or get your bandages wet. This will increase your chances of getting an infection. 6. Wear your surgical shoe at all times when you are up. 7. A limited amount of pain and swelling may occur. The skin may take on a bruised appearance. This is no cause for alarm. 8. For slight pain and swelling, apply an ice pack directly over the bandage for 15 minutes every hour. Continue icing until seen in the office. DO NOT apply any form of heat to the area. 9. Have prescription(s) filled immediately and take as directed. 10. Drink lots of liquids, water, and juice. 11. CALL THE OFFICE IMMEDIATELY IF: a. Bleeding continues b. Pain increases and/or does not respond to medication c. Bandage or cast appears too tight d. Any liquids (water, coffee, etc.) have spilled on your bandages. e. Tripping, falling, or stubbing the surgical foot f. If your temperature rises above 101 g. If you have ANY questions at all 12. Please use the crutches, knee scooter, or walker you have prescribed, rented, or purchased. If you are non-weight bearing DO NOT put weight on the operated foot for _________ days. If you are weight-bearing, follow your physician's instructions. You are expected to be: ? weight-bearing   13. Special Instructions: _____________________________________________________________ _________________________________________________________________________________ _________________________________________________________________________________  14. Cecille Aver (my surgery scheduler) will call you on  Monday with your appointment time, I will see you on Wednesday in the [facility]   If you need to reach the nurse for any reason, please call: [place]: [phone] [place]: [phone] [place]: [phone]

## 2022-10-15 NOTE — Anesthesia Preprocedure Evaluation (Signed)
Anesthesia Evaluation  Patient identified by MRN, date of birth, ID band Patient awake    Reviewed: Allergy & Precautions, NPO status , Patient's Chart, lab work & pertinent test results  History of Anesthesia Complications Negative for: history of anesthetic complications  Airway Mallampati: III  TM Distance: >3 FB Neck ROM: Full    Dental  (+) Teeth Intact, Dental Advisory Given   Pulmonary sleep apnea    breath sounds clear to auscultation       Cardiovascular negative cardio ROS  Rhythm:Regular     Neuro/Psych  Neuromuscular disease    GI/Hepatic Neg liver ROS,GERD  Medicated and Controlled,,  Endo/Other  diabetes    Renal/GU negative Renal ROS     Musculoskeletal  (+) Arthritis ,    Abdominal   Peds  Hematology negative hematology ROS (+) Lab Results      Component                Value               Date                      WBC                      5.6                 10/14/2022                HGB                      13.1                10/14/2022                HCT                      39.6                10/14/2022                MCV                      88.4                10/14/2022                PLT                      245                 10/14/2022              Anesthesia Other Findings   Reproductive/Obstetrics                             Anesthesia Physical Anesthesia Plan  ASA: 2  Anesthesia Plan: MAC and Regional   Post-op Pain Management: Regional block*   Induction: Intravenous  PONV Risk Score and Plan: 2 and Ondansetron and Treatment may vary due to age or medical condition  Airway Management Planned: Nasal Cannula, Natural Airway and Simple Face Mask  Additional Equipment: None  Intra-op Plan:   Post-operative Plan:   Informed Consent: I have reviewed the patients History and Physical, chart, labs and discussed the procedure including the risks,  benefits and alternatives for the  proposed anesthesia with the patient or authorized representative who has indicated his/her understanding and acceptance.     Dental advisory given  Plan Discussed with: CRNA  Anesthesia Plan Comments:        Anesthesia Quick Evaluation

## 2022-10-15 NOTE — Anesthesia Procedure Notes (Signed)
Anesthesia Regional Block: Popliteal block   Pre-Anesthetic Checklist: , timeout performed,  Correct Patient, Correct Site, Correct Laterality,  Correct Procedure, Correct Position, site marked,  Risks and benefits discussed,  Surgical consent,  Pre-op evaluation,  At surgeon's request and post-op pain management  Laterality: Right and Lower  Prep: chloraprep       Needles:  Injection technique: Single-shot      Needle Length: 9cm  Needle Gauge: 22     Additional Needles: Arrow StimuQuik ECHO Echogenic Stimulating PNB Needle  Procedures:,,,, ultrasound used (permanent image in chart),,    Narrative:  Start time: 10/15/2022 8:19 AM End time: 10/15/2022 8:24 AM Injection made incrementally with aspirations every 5 mL.  Performed by: Personally  Anesthesiologist: Oleta Mouse, MD

## 2022-10-15 NOTE — Anesthesia Postprocedure Evaluation (Signed)
Anesthesia Post Note  Patient: Ashley Hale  Procedure(s) Performed: AMPUTATION TOE, RIGHT THIRD TOE (Right: Toe)     Patient location during evaluation: PACU Anesthesia Type: Regional and MAC Level of consciousness: awake and alert Pain management: pain level controlled Vital Signs Assessment: post-procedure vital signs reviewed and stable Respiratory status: spontaneous breathing, nonlabored ventilation and respiratory function stable Cardiovascular status: stable and blood pressure returned to baseline Postop Assessment: no apparent nausea or vomiting Anesthetic complications: no   No notable events documented.  Last Vitals:  Vitals:   10/15/22 1030 10/15/22 1045  BP: 109/68 108/67  Pulse: 75 74  Resp: (!) 8 (!) 9  Temp:  36.6 C  SpO2: 99% 99%    Last Pain:  Vitals:   10/15/22 1045  TempSrc:   PainSc: 0-No pain                 Ellison Leisure

## 2022-10-15 NOTE — Op Note (Signed)
Patient Name: Sakena Wehrs DOB: 03-Feb-1961  MRN: [id number]   Date of Service: 10/14/2022 - 10/15/2022  Surgeon: Dr. Lanae Crumbly, DPM Assistants: None Pre-operative Diagnosis:  toe infection Post-operative Diagnosis:  toe infection Procedures: Procedures:   * AMPUTATION TOE, RIGHT THIRD TOE Pathology/Specimens: ID Type Source Tests Collected by Time Destination  1 : right 3rd toe amputation Tissue PATH Digit amputation SURGICAL PATHOLOGY [name], DPM 10/15/2022 [id number]   A : right 3rd toe wound culture Wound Wound AEROBIC/ANAEROBIC CULTURE W GRAM STAIN (SURGICAL/DEEP WOUND) [name], DPM 10/15/2022 [id number]   B : right 3rd toe bone culture Tissue PATH Bone resection AEROBIC/ANAEROBIC CULTURE W GRAM STAIN (SURGICAL/DEEP WOUND) [name], DPM 10/15/2022 [phone]    Anesthesia: MAC Hemostasis: * No tourniquets in log * Estimated Blood Loss: 10cc Materials: * No implants in log * Medications: 10cc marcaine [id number] plain Complications: none  Indications for Procedure:  This is a 62 y.o. female with a history of hardware removal of the right third toe. Unfortunately developed a wound dehiscence and post operative infection, local wound care and antibiotics were unsuccessful ultimately. At my recommendation she elected for amputation of the digit. All risks, benefits and potential complications discussed prior to the procedure. All questions addressed. Informed consent signed and reviewed.      Procedure in Detail: Patient was identified in pre-operative holding area. Formal consent was signed and the right lower extremity was marked. Patient was brought back to the operating room. Anesthesia was induced. The extremity was prepped and draped in the usual sterile fashion. Timeout was taken to confirm patient name, laterality, and procedure prior to incision.   Attention was then directed to the right third toe where an incision was made in a racquet style. Dissection was carried down  to level of bone.  Dissection was continued to the metatarsophalangeal joint and all collateral ligaments were freed at the joint.  The bone and soft tissue attachments of the proximal phalanx and toe were removed and passed for pathology.  A tissue culture of the purulence was taken as well as a bone culture from the proximal phalanx. The remaining metatarsal head appeared healthy and viable.  The area was copiously irrigated.  The skin was reapproximated with monocryl and nylon. The foot was then dressed with xeroform and dry dressing . Patient tolerated the procedure well.   Disposition: Following a period of post-operative monitoring, patient will be transferred to home .

## 2022-10-15 NOTE — Progress Notes (Signed)
Orthopedic Tech Progress Note Patient Details:  Ashley Hale 30-Oct-1960 [id number]  Ortho Devices Type of Ortho Device: CAM walker Ortho Device/Splint Location: Right foot Ortho Device/Splint Interventions: Application   Post Interventions Patient Tolerated: Well Instructions Provided: Adjustment of device  Sankalp Ferrell E Adriann Thau 10/15/2022, 10:48 AM

## 2022-10-15 NOTE — Brief Op Note (Signed)
10/15/2022  10:13 AM  PATIENT:  Ashley Hale  62 y.o. female  PRE-OPERATIVE DIAGNOSIS:  toe amputatuin  POST-OPERATIVE DIAGNOSIS:  toe amputatuin  PROCEDURE:  Procedure(s): AMPUTATION TOE, RIGHT THIRD TOE (Right)  SURGEON:  Surgeon(s) and Role:    * Holmes Hays, Stephan Minister, DPM - Primary     ASSISTANTS: none   ANESTHESIA:   local and MAC  EBL:  10cc   BLOOD ADMINISTERED:none  DRAINS: none   LOCAL MEDICATIONS USED:  MARCAINE    and Amount: 10 ml  SPECIMEN:  third toe wound culture, bone culture, path  DISPOSITION OF SPECIMEN:  path and micro  COUNTS:  YES  TOURNIQUET:  * No tourniquets in log *  DICTATION: .Note written in EPIC  PLAN OF CARE: Discharge to home after PACU  PATIENT DISPOSITION:  PACU - hemodynamically stable.   Delay start of Pharmacological VTE agent (>24hrs) due to surgical blood loss or risk of bleeding: yes  WBAT in post op shoe (she brought w/ her) No dressing changes Abx and pain medication sent to [facility]. Can start Doxycycline tonight F/u with me next week - my office will schedule

## 2022-10-16 ENCOUNTER — Encounter (HOSPITAL_COMMUNITY): Payer: Self-pay | Admitting: Podiatry

## 2022-10-18 ENCOUNTER — Telehealth: Payer: Self-pay

## 2022-10-18 NOTE — Transitions of Care (Post Inpatient/ED Visit) (Signed)
   10/18/2022  Name: Ashley Hale MRN: [id number] DOB: 04/23/1961  Today's TOC FU Call Status: Today's TOC FU Call Status:: Successful TOC FU Call Competed TOC FU Call Complete Date: 10/18/22  Transition Care Management Follow-up Telephone Call Date of Discharge: 10/15/22 Discharge Facility: [facility]) Type of Discharge: Inpatient Admission Primary Inpatient Discharge Diagnosis:: Infection of lower extremity associated with hardware How have you been since you were released from the hospital?: Better Any questions or concerns?: No  Items Reviewed: Did you receive and understand the discharge instructions provided?: Yes Medications obtained and verified?: Yes (Medications Reviewed) Any new allergies since your discharge?: No Dietary orders reviewed?: NA Do you have support at home?: Yes People in Home: significant other  Home Care and Equipment/Supplies: [place] Ordered?: NA Any new equipment or medical supplies ordered?: NA  Functional Questionnaire: Do you need assistance with bathing/showering or dressing?: No Do you need assistance with meal preparation?: No Do you need assistance with eating?: No Do you have difficulty maintaining continence: No Do you need assistance with getting out of bed/getting out of a chair/moving?: No Do you have difficulty managing or taking your medications?: No  Follow up appointments reviewed: PCP Follow-up appointment confirmed?: NA Specialist Hospital Follow-up appointment confirmed?: Yes Date of Specialist follow-up appointment?: 10/20/22 Follow-Up Specialty Provider:: Dr. Lanae Crumbly, DPM Do you need transportation to your follow-up appointment?: No Do you understand care options if your condition(s) worsen?: Yes-patient verbalized understanding    Norton Blizzard, [place] ([place])  [place] [phone]

## 2022-10-19 LAB — SURGICAL PATHOLOGY

## 2022-10-20 ENCOUNTER — Ambulatory Visit (INDEPENDENT_AMBULATORY_CARE_PROVIDER_SITE_OTHER): Payer: BC Managed Care – PPO | Admitting: Podiatry

## 2022-10-20 DIAGNOSIS — Z89421 Acquired absence of other right toe(s): Secondary | ICD-10-CM

## 2022-10-20 LAB — AEROBIC/ANAEROBIC CULTURE W GRAM STAIN (SURGICAL/DEEP WOUND)
Culture: NO GROWTH
Gram Stain: NONE SEEN
Gram Stain: NONE SEEN

## 2022-10-20 MED ORDER — AMOXICILLIN-POT CLAVULANATE 875-125 MG PO TABS: 1.0000 | ORAL_TABLET | Freq: Two times a day (BID) | ORAL | 0 refills | Status: AC

## 2022-10-25 ENCOUNTER — Encounter: Payer: Self-pay | Admitting: Podiatry

## 2022-10-25 NOTE — Progress Notes (Signed)
  Subjective:  Patient ID: Ashley Hale, female    DOB: Dec 08, 1960,  MRN: [id number]  Chief Complaint  Patient presents with   Routine Post Op    POV #1 DOS 10/15/22 Amputation R 3rd toe     62 y.o. female returns for post-op check.   Review of Systems: Negative except as noted in the HPI. Denies N/V/F/Ch.   Objective:  There were no vitals filed for this visit. There is no height or weight on file to calculate BMI. Constitutional Well developed. Well nourished.  Vascular Foot warm and well perfused. Capillary refill normal to all digits.  Calf is soft and supple, no posterior calf or knee pain, negative Homans' sign  Neurologic Normal speech. Oriented to person, place, and time. Epicritic sensation to light touch grossly present bilaterally.  Dermatologic Skin healing well without signs of infection. Skin edges well coapted without signs of infection.  Orthopedic: Tenderness to palpation noted about the surgical site.     Assessment:   1. Status post amputation of lesser toe, right    Plan:  Patient was evaluated and treated and all questions answered.  S/p foot surgery right -Progressing as expected post-operatively.  We will continue antibiotics for 1 more week as a precaution.  She may begin bathing on Monday -WB Status: WBAT in postop shoe -Sutures: Return in 2 weeks for removal. -Medications: Augmentin 1 week sent to pharmacy -Foot redressed.  Return in about 12 days (around 11/01/2022) for suture removal.

## 2022-10-28 ENCOUNTER — Other Ambulatory Visit: Payer: Self-pay | Admitting: Family Medicine

## 2022-10-28 ENCOUNTER — Ambulatory Visit: Payer: BC Managed Care – PPO | Admitting: Internal Medicine

## 2022-10-28 ENCOUNTER — Encounter: Payer: Self-pay | Admitting: Internal Medicine

## 2022-10-28 VITALS — BP 118/82 | HR 83 | Temp 96.6°F | Wt 191.0 lb

## 2022-10-28 DIAGNOSIS — Z1231 Encounter for screening mammogram for malignant neoplasm of breast: Secondary | ICD-10-CM

## 2022-10-28 DIAGNOSIS — K130 Diseases of lips: Secondary | ICD-10-CM

## 2022-10-28 MED ORDER — VALACYCLOVIR HCL 1 G PO TABS: 1000.0000 mg | ORAL_TABLET | Freq: Two times a day (BID) | ORAL | 0 refills | Status: DC

## 2022-10-28 NOTE — Progress Notes (Signed)
Subjective:    Patient ID: Ashley Hale, female    DOB: 09/20/1960, 62 y.o.   MRN: [id number]  HPI  Patient presents to clinic today with complaint of 2 lesions on her bottom lower lip.  She noticed this 1 week ago after getting out of the hospital for having her toe amputated.  She reports the lesions are painful but they have seemed to improve.  She reports she has been battling staph infections for 2 months and has been on multiple antibiotics.  She is concerned that this could be related to the staph infection.  She denies other sores or lesions inside her mouth or on the palms of her hands or soles of her feet.  She has not tried anything OTC for this.  She has never used tobacco products.  Review of Systems     Past Medical History:  Diagnosis Date   GERD (gastroesophageal reflux disease)    IBS (irritable bowel syndrome)    Obesity     Current Outpatient Medications  Medication Sig Dispense Refill   BLACK COHOSH EXTRACT PO Take 1 capsule by mouth daily.     buPROPion (WELLBUTRIN XL) 150 MG 24 hr tablet Take 1 tablet (150 mg total) by mouth daily. 90 tablet 3   Cholecalciferol (VITAMIN D3) 50 MCG (2000 UT) TABS Take 2,000 Units by mouth daily.     cyclobenzaprine (FLEXERIL) 10 MG tablet Take 0.5-1 tablets (5-10 mg total) by mouth 3 (three) times daily as needed for muscle spasms. Recommend start at night only (Patient taking differently: Take 5-10 mg by mouth 3 (three) times daily as needed for muscle spasms.) 30 tablet 2   fluconazole (DIFLUCAN) 150 MG tablet Take PRN for yeast infection. Take one tablet by mouth on Day 1. Repeat dose 2nd tablet on Day 3. 2 tablet 0   fluticasone (FLONASE) 50 MCG/ACT nasal spray Place 2 sprays into both nostrils daily. Use for 4-6 weeks then stop and use seasonally or as needed. (Patient taking differently: Place 2 sprays into both nostrils daily as needed for rhinitis or allergies.) 16 g 3   hydrOXYzine (ATARAX/VISTARIL) 25 MG tablet Take 1-2  tablets (25-50 mg total) by mouth at bedtime as needed for anxiety (insomnia). (Patient not taking: Reported on 10/14/2022) 30 tablet 0   loratadine (CLARITIN) 10 MG tablet Take 1 tablet (10 mg total) by mouth daily. Use for 4-6 weeks then stop, and use as needed or seasonally (Patient not taking: Reported on 10/14/2022) 30 tablet 11   meclizine (ANTIVERT) 25 MG tablet Take 1 tablet (25 mg total) by mouth 3 (three) times daily as needed for dizziness. (Patient not taking: Reported on 10/14/2022) 30 tablet 0   Multiple Vitamins-Minerals (HAIR/SKIN/NAILS) CAPS Take 1 capsule by mouth daily.     omeprazole (PRILOSEC) 20 MG capsule TAKE 1 CAPSULE BY MOUTH TWICE DAILY BEFORE A MEAL (Patient taking differently: Take 20 mg by mouth at bedtime.) 180 capsule 1   ondansetron (ZOFRAN) 4 MG tablet Take 1 tablet (4 mg total) by mouth every 8 (eight) hours as needed. (Patient taking differently: Take 4 mg by mouth every 8 (eight) hours as needed for nausea or vomiting.) 20 tablet 0   oxyCODONE-acetaminophen (PERCOCET) 5-325 MG tablet Take 1 tablet by mouth every 6 (six) hours as needed for severe pain. 20 tablet 0   OZEMPIC, 2 MG/DOSE, 8 MG/3ML SOPN Inject 2 mg into the skin once a week. (Patient taking differently: Inject 2 mg into the skin every Monday.)  9 mL 1   PARoxetine (PAXIL) 20 MG tablet Take 1 tablet (20 mg total) by mouth daily. 90 tablet 3   rOPINIRole (REQUIP) 0.5 MG tablet Take 1 in the afternoon and take 1 at bedtime for restless leg, if need may skip afternoon dose and double PM dose. (Patient taking differently: Take 0.5 mg by mouth See admin instructions. Take 0.5 mg by mouth in the afternoon and at bedtime- may take an additional 0.5 mg at bedtime as needed for unresolved restless legs) 180 tablet 3   No current facility-administered medications for this visit.    Allergies  Allergen Reactions   Other Other (See Comments)    GAUZE used for dressing = Bleeding and severe "skin stinging"    Shellfish Allergy Nausea And Vomiting   Sulfa Antibiotics Hives and Other (See Comments)    STOMACH CRAMPS, also    Family History  Problem Relation Age of Onset   Heart disease Mother    Hypertension Mother    Hyperlipidemia Mother    Heart disease Father    Glaucoma Father    Breast cancer Neg Hx     Social History   Socioeconomic History   Marital status: Married    Spouse name: Not on file   Number of children: Not on file   Years of education: Not on file   Highest education level: Not on file  Occupational History   Not on file  Tobacco Use   Smoking status: Never   Smokeless tobacco: Never  Vaping Use   Vaping Use: Never used  Substance and Sexual Activity   Alcohol use: No    Alcohol/week: 0.0 standard drinks of alcohol   Drug use: No   Sexual activity: Not on file  Other Topics Concern   Not on file  Social History Narrative   Not on file   Social Determinants of Health   Financial Resource Strain: Not on file  Food Insecurity: No Food Insecurity (10/14/2022)   Hunger Vital Sign    Worried About Running Out of Food in the Last Year: Never true    Ran Out of Food in the Last Year: Never true  Transportation Needs: No Transportation Needs (10/14/2022)   PRAPARE - Administrator, Civil Service (Medical): No    Lack of Transportation (Non-Medical): No  Physical Activity: Not on file  Stress: Not on file  Social Connections: Not on file  Intimate Partner Violence: Not At Risk (10/14/2022)   Humiliation, Afraid, Rape, and Kick questionnaire    Fear of Current or Ex-Partner: No    Emotionally Abused: No    Physically Abused: No    Sexually Abused: No     Constitutional: Denies fever, malaise, fatigue, headache or abrupt weight changes.  HEENT: Denies eye pain, eye redness, ear pain, ringing in the ears, wax buildup, runny nose, nasal congestion, bloody nose, or sore throat. Respiratory: Denies difficulty breathing, shortness of breath, cough  or sputum production.   Cardiovascular: Denies chest pain, chest tightness, palpitations or swelling in the hands or feet.  Skin: Patient reports blisters of bottom lip.  Denies redness, rashes, or ulcercations.    No other specific complaints in a complete review of systems (except as listed in HPI above).  Objective:   Physical Exam   BP 118/82 (BP Location: Right Arm, Patient Position: Sitting, Cuff Size: Normal)   Pulse 83   Temp (!) 96.6 F (35.9 C) (Temporal)   Wt 191 lb (86.6 kg)  SpO2 97%   BMI 28.21 kg/m   Wt Readings from Last 3 Encounters:  10/15/22 190 lb (86.2 kg)  07/20/22 193 lb (87.5 kg)  06/21/22 200 lb 6.4 oz (90.9 kg)    General: Appears her stated age, overweight, in NAD. HEENT: Head: normal shape and size; Eyes: sclera white, no icterus, conjunctiva pink, PERRLA and EOMs intact; hroat/Mouth: Teeth present, mucosa pink and moist, no exudate,  or ulcerations noted.  She has 2 oval raised lesions noted on the lower lip without redness or drainage. Cardiovascular: Normal rate and rhythm.  Pulmonary/Chest: Normal effort and positive vesicular breath sounds. No respiratory distress. No wheezes, rales or ronchi noted.  Neurological: Alert and oriented.    BMET    Component Value Date/Time   NA 141 12/01/2021 0921   K 4.2 12/01/2021 0921   CL 106 12/01/2021 0921   CO2 27 12/01/2021 0921   GLUCOSE 117 (H) 12/01/2021 0921   BUN 13 12/01/2021 0921   CREATININE 0.95 10/14/2022 1324   CREATININE 1.03 12/01/2021 0921   CALCIUM 9.0 12/01/2021 0921   GFRNONAA >60 10/14/2022 1324   GFRNONAA 59 (L) 11/03/2018 0837   GFRAA 68 11/03/2018 0837    Lipid Panel     Component Value Date/Time   CHOL 204 (H) 12/01/2021 0921   TRIG 141 12/01/2021 0921   HDL 49 (L) 12/01/2021 0921   CHOLHDL 4.2 12/01/2021 0921   VLDL 31 09/09/2015 0745   LDLCALC 130 (H) 12/01/2021 0921    CBC    Component Value Date/Time   WBC 5.6 10/14/2022 1324   RBC 4.48 10/14/2022 1324    HGB 13.1 10/14/2022 1324   HGB 11.7 09/15/2022 1057   HCT 39.6 10/14/2022 1324   HCT 35.7 09/15/2022 1057   PLT 245 10/14/2022 1324   PLT 281 09/15/2022 1057   MCV 88.4 10/14/2022 1324   MCV 87 09/15/2022 1057   MCH 29.2 10/14/2022 1324   MCHC 33.1 10/14/2022 1324   RDW 12.6 10/14/2022 1324   RDW 12.6 09/15/2022 1057   LYMPHSABS 1.9 09/15/2022 1057   MONOABS 1.0 (H) 07/25/2017 1713   EOSABS 0.2 09/15/2022 1057   BASOSABS 0.0 09/15/2022 1057    Hgb A1C Lab Results  Component Value Date   HGBA1C 5.6 06/21/2022           Assessment & Plan:   Lesion of Lip:  Resembles fever blister Rx for Valtrex 1 g twice daily x 7 days Keep area moisturized  Follow-up with your PCP as previously scheduled Nicki Reaper, NP

## 2022-10-28 NOTE — Patient Instructions (Signed)
Cold Sore  A cold sore, also called a fever blister, is a small, fluid-filled sore that forms inside of the mouth or on the lips, gums, nose, chin, or cheeks. Cold sores can spread to other parts of the body, such as the eyes, fingers, or genitals. Cold sores can spread from person to person (are contagious) until the sores crust over completely. Most cold sores go away within 2 weeks. What are the causes? Cold sores are caused by a virus (herpes simplex virus type 1, HSV-1). The virus can spread from person to person through close contact, such as through: Kissing. Touching the affected area. Sharing personal items such as lip balm, razors, a drinking glass, or eating utensils. What increases the risk? Being tired, stressed, or sick. Having your period (menstruating). Being pregnant. Taking certain medicines. Being out in cold weather or getting too much sun. What are the signs or symptoms? Symptoms of a cold sore go through different stages: Tingling, itching, or burning is felt 1-2 days before the cold sore appears. Fluid-filled blisters appear on the lips, inside the mouth, on the nose, or on the cheeks. The blisters start to ooze clear fluid. The blisters dry up, and a yellow crust appears in their place. The crust falls off. In some cases, other symptoms can develop along with cold sores. These can include: Fever. Sore throat. Headache. Muscle aches. Swollen neck glands. How is this treated? There is no cure for cold sores or the virus that causes them. There is also no vaccine to prevent the virus. Most cold sores go away on their own without treatment within 2 weeks. Your doctor may prescribe medicines to: Help with pain. Keep the virus from growing. Help you heal faster. Medicines may be in the form of creams, gels, pills, or a shot. Follow these instructions at home: Medicines Take or apply over-the-counter and prescription medicines only as told by your doctor. Use a  cotton-tip swab to apply creams or gels to your sores. Ask your doctor if you can take lysine supplements. These may help with healing. Sore care  Do not touch the sores or pick the scabs. Wash your hands often with soap and water for at least 20 seconds. Do not touch your eyes without washing your hands first. Keep the sores clean and dry. If told, put ice on the sores. To do this: Put ice in a plastic bag. Place a towel between your skin and the bag. Leave the ice on for 20 minutes, 2-3 times a day. Take off the ice if your skin turns bright red. This is very important. If you cannot feel pain, heat, or cold, you have a greater risk of damage to the area. Eating and drinking Eat a soft, bland diet. Avoid eating hot, cold, or salty foods. These can hurt your mouth. Use a straw if it hurts to drink out of a glass. Eat foods that have a lot of lysine in them. These include meat, fish, and dairy products. Avoid sugary foods, chocolates, nuts, and grains. These foods have a high amount of a substance (arginine) that can cause the virus to grow. Lifestyle Do not kiss, have oral sex, or share personal items until your sores heal. Stress, poor sleep, and being out in the sun can trigger a cold sore. Make sure you: Do activities that help you relax, such as deep breathing exercises or meditation. Get enough sleep. Put sunscreen on your lips before you go out in the sun. Contact a   doctor if: You have symptoms for more than 2 weeks. You have pus coming from the sores. You have redness that is spreading. You have pain or irritation in your eye. You get sores on your genitals. Your sores do not heal within 2 weeks. You get cold sores often. Get help right away if: You have a fever and your symptoms suddenly get worse. You have a headache and confusion. You have tiredness (fatigue). You do not want to eat as much as normal (loss of appetite). You have a stiff neck or are sensitive to  light. Summary A cold sore is a small, fluid-filled sore that forms inside of the mouth or on the lips, gums, nose, chin, or cheeks. Cold sores can spread from person to person (are contagious) until the sores crust over completely. Most cold sores go away within 2 weeks. Wash your hands often. Do not touch your eyes without washing your hands first. Do not kiss, have oral sex, or share personal items until your sores heal. Contact a doctor if your sores do not heal within 2 weeks. This information is not intended to replace advice given to you by your health care provider. Make sure you discuss any questions you have with your health care provider. Document Revised: 04/15/2021 Document Reviewed: 04/15/2021 Elsevier Patient Education  2023 Elsevier Inc.    

## 2022-11-01 ENCOUNTER — Encounter: Payer: BC Managed Care – PPO | Admitting: Podiatry

## 2022-11-03 ENCOUNTER — Ambulatory Visit (INDEPENDENT_AMBULATORY_CARE_PROVIDER_SITE_OTHER): Payer: BC Managed Care – PPO | Admitting: Podiatry

## 2022-11-03 DIAGNOSIS — Z89421 Acquired absence of other right toe(s): Secondary | ICD-10-CM

## 2022-11-05 NOTE — Progress Notes (Signed)
  Subjective:  Patient ID: Ashley Hale, female    DOB: [birth year],  MRN: [id number]  Chief Complaint  Patient presents with   Routine Post Op    POV #2 DOS 10/15/22 Amputation R 3rd toe     62 y.o. female returns for post-op check.  She is doing well not having any pain  Review of Systems: Negative except as noted in the HPI. Denies N/V/F/Ch.   Objective:  There were no vitals filed for this visit. There is no height or weight on file to calculate BMI. Constitutional Well developed. Well nourished.  Vascular Foot warm and well perfused. Capillary refill normal to all digits.  Calf is soft and supple, no posterior calf or knee pain, negative Homans' sign  Neurologic Normal speech. Oriented to person, place, and time. Epicritic sensation to light touch grossly present bilaterally.  Dermatologic Incision well-healed not hypertrophic  Orthopedic: She has little to no edema and no tenderness to palpation noted about the surgical site.     Assessment:   1. Status post amputation of lesser toe, right    Plan:  Patient was evaluated and treated and all questions answered.  S/p foot surgery right -Overall doing well.  Sutures removed uneventfully.  Return to regular shoe gear and bathing.  I will see her back in 1 month for follow-up.  Return in about 1 month (around 12/03/2022) for post op (no x-rays).

## 2022-11-08 ENCOUNTER — Other Ambulatory Visit: Payer: Self-pay | Admitting: Family Medicine

## 2022-11-08 DIAGNOSIS — N951 Menopausal and female climacteric states: Secondary | ICD-10-CM

## 2022-11-09 NOTE — Telephone Encounter (Signed)
Requested Prescriptions  Pending Prescriptions Disp Refills   PARoxetine (PAXIL) 20 MG tablet [Pharmacy Med Name: PAROXETINE HCL 20 MG TAB] 90 tablet 0    Sig: TAKE 1 TABLET BY MOUTH ONCE DAILY     Psychiatry:  Antidepressants - SSRI Passed - 11/08/2022  1:36 PM      Passed - Valid encounter within last 6 months    Recent Outpatient Visits           1 week ago Lesion of lip   [place] [facility] Piqua, Salvadore Oxford, NP   3 months ago Decreased appetite   [place] [facility] McGuffey, [place] W, NP   4 months ago Type 2 diabetes mellitus with other specified complication, without long-term current use of insulin [facility])   [place] [facility] Lyman, Netta Neat, DO   8 months ago Type 2 diabetes mellitus with other specified complication, without long-term current use of insulin [facility])   [place] [facility] Smitty Cords, DO   9 months ago Post-COVID-19 syndrome   [place] [facility] Smitty Cords, DO       Future Appointments             In 1 month Althea Charon, Netta Neat, DO [facility], [facility]

## 2022-11-24 ENCOUNTER — Ambulatory Visit
Admission: RE | Admit: 2022-11-24 | Discharge: 2022-11-24 | Disposition: A | Discharge status: Home / Self Care | Payer: BC Managed Care – PPO | Source: Ambulatory Visit | Attending: Family Medicine | Admitting: Family Medicine

## 2022-11-24 DIAGNOSIS — Z1231 Encounter for screening mammogram for malignant neoplasm of breast: Secondary | ICD-10-CM | POA: Diagnosis not present

## 2022-12-01 ENCOUNTER — Ambulatory Visit (INDEPENDENT_AMBULATORY_CARE_PROVIDER_SITE_OTHER): Payer: BC Managed Care – PPO | Admitting: Podiatry

## 2022-12-01 DIAGNOSIS — Z89421 Acquired absence of other right toe(s): Secondary | ICD-10-CM

## 2022-12-01 NOTE — Progress Notes (Signed)
  Subjective:  Patient ID: Ashley Hale, female    DOB: 1961/03/03,  MRN: [id number]  Chief Complaint  Patient presents with   Routine Post Op    POV #3 DOS 10/15/22 Amputation R 3rd toe     62 y.o. female returns for post-op check.  She is doing well she is back to work does notice some strength differences, occasional tingling shooting pain especially at night  Review of Systems: Negative except as noted in the HPI. Denies N/V/F/Ch.   Objective:  There were no vitals filed for this visit. There is no height or weight on file to calculate BMI. Constitutional Well developed. Well nourished.  Vascular Foot warm and well perfused. Capillary refill normal to all digits.  Calf is soft and supple, no posterior calf or knee pain, negative Homans' sign  Neurologic Normal speech. Oriented to person, place, and time. Epicritic sensation to light touch grossly present bilaterally.  Dermatologic Incision well-healed not hypertrophic, some indurated fullness at the distal portions of the incision  Orthopedic: She has little to no edema and no tenderness to palpation noted about the surgical site.     Assessment:   1. Status post amputation of lesser toe, right (HCC)    Plan:  Patient was evaluated and treated and all questions answered.  S/p foot surgery right -Overall doing well.  The occasional tingling shooting pain I expect to resolve with time as well as improvement in her stability strengthening and conditioning.  So far not affecting ambulation very much.  No signs of infection persisting.  She will follow-up me as needed.  No follow-ups on file.

## 2022-12-06 ENCOUNTER — Other Ambulatory Visit: Payer: Self-pay | Admitting: Family Medicine

## 2022-12-06 DIAGNOSIS — F4321 Adjustment disorder with depressed mood: Secondary | ICD-10-CM

## 2022-12-07 NOTE — Telephone Encounter (Signed)
Requested medication (s) are due for refill today: yes  Requested medication (s) are on the active medication list: yes  Last refill:  11/30/21 #90/3  Future visit scheduled: yes  Notes to clinic:  Unable to refill per protocol due to failed labs, no updated results.      Requested Prescriptions  Pending Prescriptions Disp Refills   buPROPion (WELLBUTRIN XL) 150 MG 24 hr tablet [Pharmacy Med Name: BUPROPION HCL ER (XL) 150 MG TAB] 90 tablet 3    Sig: TAKE 1 TABLET BY MOUTH ONCE DAILY     Psychiatry: Antidepressants - bupropion Failed - 12/06/2022 10:00 AM      Failed - AST in normal range and within 360 days    AST  Date Value Ref Range Status  12/01/2021 15 10 - 35 U/L Final         Failed - ALT in normal range and within 360 days    ALT  Date Value Ref Range Status  12/01/2021 16 6 - 29 U/L Final         Passed - Cr in normal range and within 360 days    Creat  Date Value Ref Range Status  12/01/2021 1.03 0.50 - 1.05 mg/dL Final   Creatinine, Ser  Date Value Ref Range Status  10/14/2022 0.95 0.44 - 1.00 mg/dL Final   Creatinine, Urine  Date Value Ref Range Status  06/21/2022 79 20 - 275 mg/dL Final         Passed - Last BP in normal range    BP Readings from Last 1 Encounters:  10/28/22 118/82         Passed - Valid encounter within last 6 months    Recent Outpatient Visits           1 month ago Lesion of lip   [place] [facility] [place], Salvadore Oxford, NP   4 months ago Decreased appetite   [facility] [place], [place] W, NP   5 months ago Type 2 diabetes mellitus with other specified complication, without long-term current use of insulin [facility])   [place] [facility] [place], Netta Neat, DO   9 months ago Type 2 diabetes mellitus with other specified complication, without long-term current use of insulin [facility])   [place] [facility] Smitty Cords, DO   10 months  ago Post-COVID-19 syndrome   [place] Althea Charon, Netta Neat, DO       Future Appointments             In 2 weeks Althea Charon, Netta Neat, DO [facility], [facility]

## 2022-12-20 ENCOUNTER — Other Ambulatory Visit: Payer: BC Managed Care – PPO

## 2022-12-20 DIAGNOSIS — Z0000 Encounter for general adult medical examination without abnormal findings: Secondary | ICD-10-CM

## 2022-12-20 DIAGNOSIS — E1169 Type 2 diabetes mellitus with other specified complication: Secondary | ICD-10-CM

## 2022-12-20 DIAGNOSIS — E785 Hyperlipidemia, unspecified: Secondary | ICD-10-CM

## 2022-12-21 LAB — CBC WITH DIFFERENTIAL/PLATELET
Absolute Monocytes: 387 cells/uL (ref 200–950)
Basophils Absolute: 41 cells/uL (ref 0–200)
Basophils Relative: 0.9 %
Eosinophils Absolute: 171 cells/uL (ref 15–500)
Eosinophils Relative: 3.8 %
HCT: 38.8 % (ref 35.0–45.0)
Hemoglobin: 12.5 g/dL (ref 11.7–15.5)
Lymphs Abs: 1512 cells/uL (ref 850–3900)
MCH: 28.5 pg (ref 27.0–33.0)
MCHC: 32.2 g/dL (ref 32.0–36.0)
MCV: 88.4 fL (ref 80.0–100.0)
MPV: 9.3 fL (ref 7.5–12.5)
Monocytes Relative: 8.6 %
Neutro Abs: 2390 cells/uL (ref 1500–7800)
Neutrophils Relative %: 53.1 %
Platelets: 253 10*3/uL (ref 140–400)
RBC: 4.39 10*6/uL (ref 3.80–5.10)
RDW: 12.7 % (ref 11.0–15.0)
Total Lymphocyte: 33.6 %
WBC: 4.5 10*3/uL (ref 3.8–10.8)

## 2022-12-21 LAB — LIPID PANEL
Cholesterol: 178 mg/dL (ref <200)
HDL: 65 mg/dL (ref >=50)
LDL Cholesterol (Calc): 95 mg/dL (calc)
Non-HDL Cholesterol (Calc): 113 mg/dL (calc) (ref <130)
Total CHOL/HDL Ratio: 2.7 (calc) (ref <5.0)
Triglycerides: 85 mg/dL (ref <150)

## 2022-12-21 LAB — COMPLETE METABOLIC PANEL WITH GFR
AG Ratio: 1.7 (calc) (ref 1.0–2.5)
ALT: 22 U/L (ref 6–29)
AST: 19 U/L (ref 10–35)
Albumin: 4.1 g/dL (ref 3.6–5.1)
Alkaline phosphatase (APISO): 96 U/L (ref 37–153)
BUN: 18 mg/dL (ref 7–25)
CO2: 29 mmol/L (ref 20–32)
Calcium: 9.4 mg/dL (ref 8.6–10.4)
Chloride: 104 mmol/L (ref 98–110)
Creat: 1.05 mg/dL (ref 0.50–1.05)
Globulin: 2.4 g/dL (calc) (ref 1.9–3.7)
Glucose, Bld: 98 mg/dL (ref 65–99)
Potassium: 4.7 mmol/L (ref 3.5–5.3)
Sodium: 142 mmol/L (ref 135–146)
Total Bilirubin: 0.4 mg/dL (ref 0.2–1.2)
Total Protein: 6.5 g/dL (ref 6.1–8.1)
eGFR: 59 mL/min/{1.73_m2} — ABNORMAL LOW (ref >=60)

## 2022-12-21 LAB — HEMOGLOBIN A1C
Hgb A1c MFr Bld: 5.5 % of total Hgb (ref <5.7)
Mean Plasma Glucose: 111 mg/dL
eAG (mmol/L): 6.2 mmol/L

## 2022-12-21 LAB — TSH: TSH: 1.62 mIU/L (ref 0.40–4.50)

## 2022-12-27 ENCOUNTER — Encounter: Payer: Self-pay | Admitting: Family Medicine

## 2022-12-27 ENCOUNTER — Ambulatory Visit (INDEPENDENT_AMBULATORY_CARE_PROVIDER_SITE_OTHER): Payer: BC Managed Care – PPO | Admitting: Family Medicine

## 2022-12-27 VITALS — BP 116/69 | HR 82 | Temp 98.3°F | Resp 17 | Ht 69.0 in | Wt 190.2 lb

## 2022-12-27 DIAGNOSIS — E1169 Type 2 diabetes mellitus with other specified complication: Secondary | ICD-10-CM

## 2022-12-27 DIAGNOSIS — Z0000 Encounter for general adult medical examination without abnormal findings: Secondary | ICD-10-CM | POA: Diagnosis not present

## 2022-12-27 DIAGNOSIS — N951 Menopausal and female climacteric states: Secondary | ICD-10-CM

## 2022-12-27 DIAGNOSIS — Z1211 Encounter for screening for malignant neoplasm of colon: Secondary | ICD-10-CM

## 2022-12-27 DIAGNOSIS — E663 Overweight: Secondary | ICD-10-CM

## 2022-12-27 DIAGNOSIS — E785 Hyperlipidemia, unspecified: Secondary | ICD-10-CM

## 2022-12-27 DIAGNOSIS — G4733 Obstructive sleep apnea (adult) (pediatric): Secondary | ICD-10-CM

## 2022-12-27 DIAGNOSIS — Z89421 Acquired absence of other right toe(s): Secondary | ICD-10-CM | POA: Insufficient documentation

## 2022-12-27 DIAGNOSIS — G2581 Restless legs syndrome: Secondary | ICD-10-CM

## 2022-12-27 MED ORDER — PAROXETINE HCL 20 MG PO TABS: 20.0000 mg | ORAL_TABLET | Freq: Every day | ORAL | 3 refills | Status: DC

## 2022-12-27 MED ORDER — ROPINIROLE HCL 1 MG PO TABS: 1.0000 mg | ORAL_TABLET | Freq: Every day | ORAL | 3 refills | Status: DC

## 2022-12-27 NOTE — Assessment & Plan Note (Signed)
Continue w/ Paxil, herbal supplement

## 2022-12-27 NOTE — Patient Instructions (Addendum)
Thank you for coming to the office today.  Re ordered Paxil, increased Ropinirole to 1mg  tablets instead of 0.5, can take 1 or 2.  Recent Labs    06/21/22 0814 12/20/22 0837  HGBA1C 5.6 5.5   Keep up the great work. My hope is that the weight is gradually decreasing still.  Ordered the Cologuard (home kit) test for colon cancer screening. Stay tuned for further updates.  It will be shipped to you directly. If not received in 2-4 weeks, call us or the company.   If you send it back and no results are received in 2-4 weeks, call us or the company as well!   Colon Cancer Screening: - For all adults age 80+ routine colon cancer screening is highly recommended.     - Recent guidelines from [employer] Society recommend starting age of 29 - Early detection of colon cancer is important, because often there are no warning signs or symptoms, also if found early usually it can be cured. Late stage is hard to treat.   - If Cologuard is NEGATIVE, then it is good for 3 years before next due - If Cologuard is POSITIVE, then it is strongly advised to get a Colonoscopy, which allows the GI doctor to locate the source of the cancer or polyp (even very early stage) and treat it by removing it. ------------------------- Follow instructions to collect sample, you may call the company for any help or questions, 24/7 telephone support at [phone].   Please schedule a Follow-up Appointment to: Return in about 6 months (around 06/28/2023) for 6 month follow-up DM A1c.  If you have any other questions or concerns, please feel free to call the office or send a message through MyChart. You may also schedule an earlier appointment if necessary.  Additionally, you may be receiving a survey about your experience at our office within a few days to 1 week by e-mail or mail. We value your feedback.  Saralyn Pilar, DO [facility], [place]

## 2022-12-27 NOTE — Assessment & Plan Note (Signed)
Major improvement on LDL to 95 with weight loss and lifestyle improvements

## 2022-12-27 NOTE — Assessment & Plan Note (Signed)
A1c at 5.5 very well controlled Dramatic improved weight loss and lifestyle on Ozempic GLP1 Down 80+ lbs in 12+ months Complication with HLD, OSA  Continue Ozempic 2mg  weekly. Remains OFF Metformin

## 2022-12-27 NOTE — Assessment & Plan Note (Signed)
Inadequate control, on CPAP seems ineffective. Awaiting further management by [facility]NT

## 2022-12-27 NOTE — Progress Notes (Signed)
Subjective:    Patient ID: Ashley Hale, female    DOB: 03/04/1961, 62 y.o.   MRN: [id number]  Ashley Hale is a 62 y.o. female presenting on 12/27/2022 for Annual Exam   HPI  Here for Annual Physical and Lab review    Type 2 Diabetes Overweight BMI >28  Weight down 80 lbs since 07/2021 Down 60 lbs 12/2021  to 06/2022, now down 10 additional lbs  Last A1c 5.5 (12/2022)   Weight loss 65+ lbs in past 6 months, on lifestyle diet overhaul and Ozempic 1mg  Completely away from regular soda. Occasional has diet soda. - Continues on medication - Today due for A1c Continues on Ozempic 1mg  weekly inj, tolerating well Also on Metformin XR 500mg  daily Side effect of constipation has improved dramatically now.  OSA on CPAP Not always using CPAP but when uses it does help Working with [facility] - Dr Gabriel Cirri They have repeated sleep study after lost weight, still severe sleep apnea still They are considering trial of failure CPAP and then considering Inspire procedure  Adjustment disorder depressed mood   RLS Taking Ropinirole, does need something in afternoon at times or stronger dose in PM On 0.5mg  dose now   Health Maintenance:   Mammogram last done 11/24/22   Cologuard negative 2021, next due 2024.      12/27/2022    9:49 AM 06/21/2022    8:12 AM 03/08/2022   10:42 AM  Depression screen PHQ 2/9  Decreased Interest 1 1 0  Down, Depressed, Hopeless 1 0 0  PHQ - 2 Score 2 1 0  Altered sleeping 2 1 0  Tired, decreased energy 1 1 0  Change in appetite 0 1 0  Feeling bad or failure about yourself  1 1 0  Trouble concentrating 1 1 0  Moving slowly or fidgety/restless 1 1 0  Suicidal thoughts 0 0 0  PHQ-9 Score 8 7 0  Difficult doing work/chores Not difficult at all Not difficult at all Not difficult at all    Past Medical History:  Diagnosis Date   GERD (gastroesophageal reflux disease)    IBS (irritable bowel syndrome)    Obesity    Past Surgical History:  Procedure  Laterality Date   ABDOMINAL HYSTERECTOMY     AMPUTATION TOE Right 10/15/2022   Procedure: AMPUTATION TOE, RIGHT THIRD TOE;  Surgeon: Edwin Cap, DPM;  Location: [facility];  Service: Podiatry;  Laterality: Right;   BACK SURGERY  1992   Lumbar spine, herniated disc   Social History   Socioeconomic History   Marital status: Married    Spouse name: Not on file   Number of children: Not on file   Years of education: Not on file   Highest education level: Not on file  Occupational History   Not on file  Tobacco Use   Smoking status: Never   Smokeless tobacco: Never  Vaping Use   Vaping Use: Never used  Substance and Sexual Activity   Alcohol use: No    Alcohol/week: 0.0 standard drinks of alcohol   Drug use: No   Sexual activity: Not on file  Other Topics Concern   Not on file  Social History Narrative   Not on file   Social Determinants of Health   Financial Resource Strain: Not on file  Food Insecurity: No Food Insecurity (10/14/2022)   Hunger Vital Sign    Worried About Running Out of Food in the Last Year: Never true  Ran Out of Food in the Last Year: Never true  Transportation Needs: No Transportation Needs (10/14/2022)   PRAPARE - Administrator, Civil Service (Medical): No    Lack of Transportation (Non-Medical): No  Physical Activity: Not on file  Stress: Not on file  Social Connections: Not on file  Intimate Partner Violence: Not At Risk (10/14/2022)   Humiliation, Afraid, Rape, and Kick questionnaire    Fear of Current or Ex-Partner: No    Emotionally Abused: No    Physically Abused: No    Sexually Abused: No   Family History  Problem Relation Age of Onset   Heart disease Mother    Hypertension Mother    Hyperlipidemia Mother    Heart disease Father    Glaucoma Father    Breast cancer Neg Hx    Current Outpatient Medications on File Prior to Visit  Medication Sig   BLACK COHOSH EXTRACT PO Take 1 capsule by mouth daily.   buPROPion  (WELLBUTRIN XL) 150 MG 24 hr tablet TAKE 1 TABLET BY MOUTH ONCE DAILY   Cholecalciferol (VITAMIN D3) 50 MCG (2000 UT) TABS Take 2,000 Units by mouth daily.   cyclobenzaprine (FLEXERIL) 10 MG tablet Take 0.5-1 tablets (5-10 mg total) by mouth 3 (three) times daily as needed for muscle spasms. Recommend start at night only (Patient taking differently: Take 5-10 mg by mouth 3 (three) times daily as needed for muscle spasms.)   fluticasone (FLONASE) 50 MCG/ACT nasal spray Place 2 sprays into both nostrils daily. Use for 4-6 weeks then stop and use seasonally or as needed. (Patient taking differently: Place 2 sprays into both nostrils daily as needed for rhinitis or allergies.)   furosemide (LASIX) 40 MG tablet Take by mouth.   hydrOXYzine (ATARAX/VISTARIL) 25 MG tablet Take 1-2 tablets (25-50 mg total) by mouth at bedtime as needed for anxiety (insomnia).   ipratropium (ATROVENT) 0.06 % nasal spray Place into the nose.   loratadine (CLARITIN) 10 MG tablet Take 1 tablet (10 mg total) by mouth daily. Use for 4-6 weeks then stop, and use as needed or seasonally   meclizine (ANTIVERT) 25 MG tablet Take 1 tablet (25 mg total) by mouth 3 (three) times daily as needed for dizziness.   Multiple Vitamins-Minerals (HAIR/SKIN/NAILS) CAPS Take 1 capsule by mouth daily.   omeprazole (PRILOSEC) 20 MG capsule TAKE 1 CAPSULE BY MOUTH TWICE DAILY BEFORE A MEAL (Patient taking differently: Take 20 mg by mouth at bedtime.)   OZEMPIC, 2 MG/DOSE, 8 MG/3ML SOPN Inject 2 mg into the skin once a week. (Patient taking differently: Inject 2 mg into the skin every Monday.)   valACYclovir (VALTREX) 1000 MG tablet Take 1 tablet (1,000 mg total) by mouth 2 (two) times daily. (Patient not taking: Reported on 12/27/2022)   No current facility-administered medications on file prior to visit.    Review of Systems  Constitutional:  Negative for activity change, appetite change, chills, diaphoresis, fatigue and fever.  HENT:  Negative  for congestion and hearing loss.   Eyes:  Negative for visual disturbance.  Respiratory:  Negative for cough, chest tightness, shortness of breath and wheezing.   Cardiovascular:  Negative for chest pain, palpitations and leg swelling.  Gastrointestinal:  Negative for abdominal pain, constipation, diarrhea, nausea and vomiting.  Genitourinary:  Negative for dysuria, frequency and hematuria.  Musculoskeletal:  Negative for arthralgias and neck pain.  Skin:  Negative for rash.  Neurological:  Negative for dizziness, weakness, light-headedness, numbness and headaches.  Hematological:  Negative for adenopathy.  Psychiatric/Behavioral:  Negative for behavioral problems, dysphoric mood and sleep disturbance.    Per HPI unless specifically indicated above      Objective:    BP 116/69 (BP Location: Left Arm, Patient Position: Sitting, Cuff Size: Normal)   Pulse 82   Temp 98.3 F (36.8 C) (Oral)   Resp 17   Ht 5\' 9"  (1.753 m)   Wt 190 lb 3.2 oz (86.3 kg)   SpO2 100%   BMI 28.09 kg/m   Wt Readings from Last 3 Encounters:  12/27/22 190 lb 3.2 oz (86.3 kg)  10/28/22 191 lb (86.6 kg)  10/15/22 190 lb (86.2 kg)    Physical Exam Vitals and nursing note reviewed.  Constitutional:      General: She is not in acute distress.    Appearance: She is well-developed. She is not diaphoretic.     Comments: Well-appearing, comfortable, cooperative  HENT:     Head: Normocephalic and atraumatic.  Eyes:     General:        Right eye: No discharge.        Left eye: No discharge.     Conjunctiva/sclera: Conjunctivae normal.     Pupils: Pupils are equal, round, and reactive to light.  Neck:     Thyroid: No thyromegaly.     Vascular: No carotid bruit.  Cardiovascular:     Rate and Rhythm: Normal rate and regular rhythm.     Pulses: Normal pulses.     Heart sounds: Normal heart sounds. No murmur heard. Pulmonary:     Effort: Pulmonary effort is normal. No respiratory distress.     Breath  sounds: Normal breath sounds. No wheezing or rales.  Abdominal:     General: Bowel sounds are normal. There is no distension.     Palpations: Abdomen is soft. There is no mass.     Tenderness: There is no abdominal tenderness.  Musculoskeletal:        General: No tenderness. Normal range of motion.     Cervical back: Normal range of motion and neck supple.     Comments: Upper / Lower Extremities: Normal muscle tone, strength bilateral upper extremities 5/5, lower extremities 5/5  Right Foot s/p 3rd toe amputation.  L foot great toenail abnormality, loss of nail partial  Lymphadenopathy:     Cervical: No cervical adenopathy.  Skin:    General: Skin is warm and dry.     Findings: No erythema or rash.  Neurological:     Mental Status: She is alert and oriented to person, place, and time.     Comments: Distal sensation intact to light touch all extremities  Psychiatric:        Mood and Affect: Mood normal.        Behavior: Behavior normal.        Thought Content: Thought content normal.     Comments: Well groomed, good eye contact, normal speech and thoughts      Results for orders placed or performed in visit on 12/20/22  TSH  Result Value Ref Range   TSH 1.62 0.40 - 4.50 mIU/L  Hemoglobin A1c  Result Value Ref Range   Hgb A1c MFr Bld 5.5 <5.7 % of total Hgb   Mean Plasma Glucose 111 mg/dL   eAG (mmol/L) 6.2 mmol/L  Lipid panel  Result Value Ref Range   Cholesterol 178 <200 mg/dL   HDL 65 > OR = 50 mg/dL   Triglycerides 85 <161 mg/dL  LDL Cholesterol (Calc) 95 mg/dL (calc)   Total CHOL/HDL Ratio 2.7 <5.0 (calc)   Non-HDL Cholesterol (Calc) 113 <130 mg/dL (calc)  CBC with Differential/Platelet  Result Value Ref Range   WBC 4.5 3.8 - 10.8 Thousand/uL   RBC 4.39 3.80 - 5.10 Million/uL   Hemoglobin 12.5 11.7 - 15.5 g/dL   HCT 91.4 78.2 - 95.6 %   MCV 88.4 80.0 - 100.0 fL   MCH 28.5 27.0 - 33.0 pg   MCHC 32.2 32.0 - 36.0 g/dL   RDW 21.3 08.6 - 57.8 %   Platelets 253  140 - 400 Thousand/uL   MPV 9.3 7.5 - 12.5 fL   Neutro Abs 2,390 1,500 - 7,800 cells/uL   Lymphs Abs 1,512 850 - 3,900 cells/uL   Absolute Monocytes 387 200 - 950 cells/uL   Eosinophils Absolute 171 15 - 500 cells/uL   Basophils Absolute 41 0 - 200 cells/uL   Neutrophils Relative % 53.1 %   Total Lymphocyte 33.6 %   Monocytes Relative 8.6 %   Eosinophils Relative 3.8 %   Basophils Relative 0.9 %  COMPLETE METABOLIC PANEL WITH GFR  Result Value Ref Range   Glucose, Bld 98 65 - 99 mg/dL   BUN 18 7 - 25 mg/dL   Creat 4.69 6.29 - 5.28 mg/dL   eGFR 59 (L) > OR = 60 mL/min/1.51m2   BUN/Creatinine Ratio SEE NOTE: 6 - 22 (calc)   Sodium 142 135 - 146 mmol/L   Potassium 4.7 3.5 - 5.3 mmol/L   Chloride 104 98 - 110 mmol/L   CO2 29 20 - 32 mmol/L   Calcium 9.4 8.6 - 10.4 mg/dL   Total Protein 6.5 6.1 - 8.1 g/dL   Albumin 4.1 3.6 - 5.1 g/dL   Globulin 2.4 1.9 - 3.7 g/dL (calc)   AG Ratio 1.7 1.0 - 2.5 (calc)   Total Bilirubin 0.4 0.2 - 1.2 mg/dL   Alkaline phosphatase (APISO) 96 37 - 153 U/L   AST 19 10 - 35 U/L   ALT 22 6 - 29 U/L      Assessment & Plan:   Problem List Items Addressed This Visit     Hot flash, menopausal    Continue w/ Paxil, herbal supplement      Relevant Medications   PARoxetine (PAXIL) 20 MG tablet   Hyperlipidemia associated with type 2 diabetes mellitus (HCC)    Major improvement on LDL to 95 with weight loss and lifestyle improvements      OSA on CPAP    Inadequate control, on CPAP seems ineffective. Awaiting further management by [facility]      Overweight (BMI 25.0-29.9)   S/P amputation of lesser toe, right (HCC)   Type 2 diabetes mellitus with other specified complication (HCC)    A1c at 5.5 very well controlled Dramatic improved weight loss and lifestyle on Ozempic GLP1 Down 80+ lbs in 12+ months Complication with HLD, OSA  Continue Ozempic 2mg  weekly. Remains OFF Metformin      Other Visit Diagnoses     Annual physical exam    -   Primary   Colon cancer screening       Relevant Orders   Cologuard   RLS (restless legs syndrome)       Relevant Medications   rOPINIRole (REQUIP) 1 MG tablet         Updated Health Maintenance information Reviewed recent lab results with patient Encouraged improvement to lifestyle with diet and exercise Goal of weight  loss  Due for routine colon cancer screening. Never had colonoscopy (not interested), no family history colon cancer. - Discussion today about recommendations for either Colonoscopy or Cologuard screening, benefits and risks of screening, interested in Cologuard, understands that if positive then recommendation is for diagnostic colonoscopy to follow-up. - Ordered Cologuard today  We discussed future reconsider Colonoscopy but not indicated this time. Okay with Cologuard  Orders Placed This Encounter  Procedures   Cologuard    Meds ordered this encounter  Medications   PARoxetine (PAXIL) 20 MG tablet    Sig: Take 1 tablet (20 mg total) by mouth daily.    Dispense:  90 tablet    Refill:  3    Add refills   rOPINIRole (REQUIP) 1 MG tablet    Sig: Take 1-2 tablets (1-2 mg total) by mouth at bedtime.    Dispense:  180 tablet    Refill:  3     Follow up plan: Return in about 6 months (around 06/28/2023) for 6 month follow-up DM A1c.  Saralyn Pilar, DO [facility] 12/27/2022, 10:02 AM

## 2023-01-07 ENCOUNTER — Encounter: Payer: Self-pay | Admitting: Internal Medicine

## 2023-01-07 ENCOUNTER — Ambulatory Visit: Payer: BC Managed Care – PPO | Admitting: Internal Medicine

## 2023-01-07 ENCOUNTER — Ambulatory Visit
Admission: RE | Admit: 2023-01-07 | Discharge: 2023-01-07 | Disposition: A | Discharge status: Home / Self Care | Payer: BC Managed Care – PPO | Attending: Internal Medicine | Admitting: Internal Medicine

## 2023-01-07 ENCOUNTER — Ambulatory Visit
Admission: RE | Admit: 2023-01-07 | Discharge: 2023-01-07 | Disposition: A | Discharge status: Home / Self Care | Payer: BC Managed Care – PPO | Source: Ambulatory Visit | Attending: Internal Medicine | Admitting: Internal Medicine

## 2023-01-07 VITALS — BP 124/62 | HR 112 | Temp 96.6°F | Wt 194.0 lb

## 2023-01-07 DIAGNOSIS — R000 Tachycardia, unspecified: Secondary | ICD-10-CM | POA: Insufficient documentation

## 2023-01-07 DIAGNOSIS — R1111 Vomiting without nausea: Secondary | ICD-10-CM

## 2023-01-07 DIAGNOSIS — R0602 Shortness of breath: Secondary | ICD-10-CM | POA: Diagnosis not present

## 2023-01-07 DIAGNOSIS — R5383 Other fatigue: Secondary | ICD-10-CM | POA: Diagnosis not present

## 2023-01-07 DIAGNOSIS — R1310 Dysphagia, unspecified: Secondary | ICD-10-CM

## 2023-01-07 DIAGNOSIS — H9203 Otalgia, bilateral: Secondary | ICD-10-CM

## 2023-01-07 MED ORDER — LEVOFLOXACIN 500 MG PO TABS: 500.0000 mg | ORAL_TABLET | Freq: Every day | ORAL | 0 refills | Status: DC

## 2023-01-07 NOTE — Progress Notes (Unsigned)
Subjective:    Patient ID: Ashley Hale, female    DOB: 24-May-1961, 62 y.o.   MRN: [id number]  HPI  Pt presents to the clinic today with c/o headaches, metallic taste in mouth, ear pain, difficulty swallowing (without sore throat), shortness of breath and vomiting. This started 5 days ago. Also states she has a metallic taste in her mouth. She has tried Ibuprofen OTC. She has a history of DM 2, OSA.  Review of Systems     Past Medical History:  Diagnosis Date   GERD (gastroesophageal reflux disease)    IBS (irritable bowel syndrome)    Obesity     Current Outpatient Medications  Medication Sig Dispense Refill   BLACK COHOSH EXTRACT PO Take 1 capsule by mouth daily.     buPROPion (WELLBUTRIN XL) 150 MG 24 hr tablet TAKE 1 TABLET BY MOUTH ONCE DAILY 90 tablet 3   Cholecalciferol (VITAMIN D3) 50 MCG (2000 UT) TABS Take 2,000 Units by mouth daily.     cyclobenzaprine (FLEXERIL) 10 MG tablet Take 0.5-1 tablets (5-10 mg total) by mouth 3 (three) times daily as needed for muscle spasms. Recommend start at night only (Patient taking differently: Take 5-10 mg by mouth 3 (three) times daily as needed for muscle spasms.) 30 tablet 2   fluticasone (FLONASE) 50 MCG/ACT nasal spray Place 2 sprays into both nostrils daily. Use for 4-6 weeks then stop and use seasonally or as needed. (Patient taking differently: Place 2 sprays into both nostrils daily as needed for rhinitis or allergies.) 16 g 3   furosemide (LASIX) 40 MG tablet Take by mouth.     hydrOXYzine (ATARAX/VISTARIL) 25 MG tablet Take 1-2 tablets (25-50 mg total) by mouth at bedtime as needed for anxiety (insomnia). 30 tablet 0   ipratropium (ATROVENT) 0.06 % nasal spray Place into the nose.     loratadine (CLARITIN) 10 MG tablet Take 1 tablet (10 mg total) by mouth daily. Use for 4-6 weeks then stop, and use as needed or seasonally 30 tablet 11   meclizine (ANTIVERT) 25 MG tablet Take 1 tablet (25 mg total) by mouth 3 (three) times daily as  needed for dizziness. 30 tablet 0   Multiple Vitamins-Minerals (HAIR/SKIN/NAILS) CAPS Take 1 capsule by mouth daily.     omeprazole (PRILOSEC) 20 MG capsule TAKE 1 CAPSULE BY MOUTH TWICE DAILY BEFORE A MEAL (Patient taking differently: Take 20 mg by mouth at bedtime.) 180 capsule 1   OZEMPIC, 2 MG/DOSE, 8 MG/3ML SOPN Inject 2 mg into the skin once a week. (Patient taking differently: Inject 2 mg into the skin every Monday.) 9 mL 1   PARoxetine (PAXIL) 20 MG tablet Take 1 tablet (20 mg total) by mouth daily. 90 tablet 3   rOPINIRole (REQUIP) 1 MG tablet Take 1-2 tablets (1-2 mg total) by mouth at bedtime. 180 tablet 3   valACYclovir (VALTREX) 1000 MG tablet Take 1 tablet (1,000 mg total) by mouth 2 (two) times daily. (Patient not taking: Reported on 12/27/2022) 14 tablet 0   No current facility-administered medications for this visit.    Allergies  Allergen Reactions   Other Other (See Comments)    GAUZE used for dressing = Bleeding and severe "skin stinging"   Shellfish Allergy Nausea And Vomiting   Sulfa Antibiotics Hives and Other (See Comments)    STOMACH CRAMPS, also    Family History  Problem Relation Age of Onset   Heart disease Mother    Hypertension Mother    Hyperlipidemia  Mother    Heart disease Father    Glaucoma Father    Breast cancer Neg Hx     Social History   Socioeconomic History   Marital status: Married    Spouse name: Not on file   Number of children: Not on file   Years of education: Not on file   Highest education level: Not on file  Occupational History   Not on file  Tobacco Use   Smoking status: Never   Smokeless tobacco: Never  Vaping Use   Vaping Use: Never used  Substance and Sexual Activity   Alcohol use: No    Alcohol/week: 0.0 standard drinks of alcohol   Drug use: No   Sexual activity: Not on file  Other Topics Concern   Not on file  Social History Narrative   Not on file   Social Determinants of Health   Financial Resource  Strain: Not on file  Food Insecurity: No Food Insecurity (10/14/2022)   Hunger Vital Sign    Worried About Running Out of Food in the Last Year: Never true    Ran Out of Food in the Last Year: Never true  Transportation Needs: No Transportation Needs (10/14/2022)   PRAPARE - Administrator, Civil Service (Medical): No    Lack of Transportation (Non-Medical): No  Physical Activity: Not on file  Stress: Not on file  Social Connections: Not on file  Intimate Partner Violence: Not At Risk (10/14/2022)   Humiliation, Afraid, Rape, and Kick questionnaire    Fear of Current or Ex-Partner: No    Emotionally Abused: No    Physically Abused: No    Sexually Abused: No     Constitutional: Denies fever, malaise, fatigue, headache or abrupt weight changes.  HEENT: Denies eye pain, eye redness, ear pain, ringing in the ears, wax buildup, runny nose, nasal congestion, bloody nose, or sore throat. Respiratory: Pt reports shortness of breath. Denies difficulty breathing, cough or sputum production.   Cardiovascular: Denies chest pain, chest tightness, palpitations or swelling in the hands or feet.  Gastrointestinal: Denies abdominal pain, bloating, constipation, diarrhea or blood in the stool.  GU: Denies urgency, frequency, pain with urination, burning sensation, blood in urine, odor or discharge. Musculoskeletal: Pt reports chronic joint pain. Denies decrease in range of motion, difficulty with gait, muscle pain or joint swelling.  Skin: Denies redness, rashes, lesions or ulcercations.  Neurological: Denies dizziness, difficulty with memory, difficulty with speech or problems with balance and coordination.  Psych: Denies anxiety, depression, SI/HI.  No other specific complaints in a complete review of systems (except as listed in HPI above).  Objective:   Physical Exam   There were no vitals taken for this visit. Wt Readings from Last 3 Encounters:  12/27/22 190 lb 3.2 oz (86.3 kg)   10/28/22 191 lb (86.6 kg)  10/15/22 190 lb (86.2 kg)    General: Appears their stated age, well developed, well nourished in NAD. Skin: Warm, dry and intact. No rashes, lesions or ulcerations noted. HEENT: Head: normal shape and size; Eyes: sclera white, no icterus, conjunctiva pink, PERRLA and EOMs intact; Ears: Tm's gray and intact, normal light reflex; Nose: mucosa pink and moist, septum midline; Throat/Mouth: Teeth present, mucosa pink and moist, no exudate, lesions or ulcerations noted.  Neck:  Neck supple, trachea midline. No masses, lumps or thyromegaly present.  Cardiovascular: Normal rate and rhythm. S1,S2 noted.  No murmur, rubs or gallops noted. No JVD or BLE edema. No carotid bruits  noted. Pulmonary/Chest: Normal effort and positive vesicular breath sounds. No respiratory distress. No wheezes, rales or ronchi noted.  Abdomen: Soft and nontender. Normal bowel sounds. No distention or masses noted. Liver, spleen and kidneys non palpable. Musculoskeletal: Normal range of motion. No signs of joint swelling. No difficulty with gait.  Neurological: Alert and oriented. Cranial nerves II-XII grossly intact. Coordination normal.  Psychiatric: Mood and affect normal. Behavior is normal. Judgment and thought content normal.    BMET    Component Value Date/Time   NA 142 12/20/2022 0837   K 4.7 12/20/2022 0837   CL 104 12/20/2022 0837   CO2 29 12/20/2022 0837   GLUCOSE 98 12/20/2022 0837   BUN 18 12/20/2022 0837   CREATININE 1.05 12/20/2022 0837   CALCIUM 9.4 12/20/2022 0837   GFRNONAA >60 10/14/2022 1324   GFRNONAA 59 (L) 11/03/2018 0837   GFRAA 68 11/03/2018 0837    Lipid Panel     Component Value Date/Time   CHOL 178 12/20/2022 0837   TRIG 85 12/20/2022 0837   HDL 65 12/20/2022 0837   CHOLHDL 2.7 12/20/2022 0837   VLDL 31 09/09/2015 0745   LDLCALC 95 12/20/2022 0837    CBC    Component Value Date/Time   WBC 4.5 12/20/2022 0837   RBC 4.39 12/20/2022 0837   HGB 12.5  12/20/2022 0837   HGB 11.7 09/15/2022 1057   HCT 38.8 12/20/2022 0837   HCT 35.7 09/15/2022 1057   PLT 253 12/20/2022 0837   PLT 281 09/15/2022 1057   MCV 88.4 12/20/2022 0837   MCV 87 09/15/2022 1057   MCH 28.5 12/20/2022 0837   MCHC 32.2 12/20/2022 0837   RDW 12.7 12/20/2022 0837   RDW 12.6 09/15/2022 1057   LYMPHSABS 1,512 12/20/2022 0837   LYMPHSABS 1.9 09/15/2022 1057   MONOABS 1.0 (H) 07/25/2017 1713   EOSABS 171 12/20/2022 0837   EOSABS 0.2 09/15/2022 1057   BASOSABS 41 12/20/2022 0837   BASOSABS 0.0 09/15/2022 1057    Hgb A1C Lab Results  Component Value Date   HGBA1C 5.5 12/20/2022           Assessment & Plan:     Follow up with your PCP as previously scheduled Nicki Reaper, NP

## 2023-01-08 LAB — CBC
HCT: 31.5 % — ABNORMAL LOW (ref 35.0–45.0)
Hemoglobin: 10.7 g/dL — ABNORMAL LOW (ref 11.7–15.5)
MCH: 29.5 pg (ref 27.0–33.0)
MCHC: 34 g/dL (ref 32.0–36.0)
MCV: 86.8 fL (ref 80.0–100.0)
MPV: 9.5 fL (ref 7.5–12.5)
Platelets: 248 10*3/uL (ref 140–400)
RBC: 3.63 10*6/uL — ABNORMAL LOW (ref 3.80–5.10)
RDW: 12.5 % (ref 11.0–15.0)
WBC: 8.2 10*3/uL (ref 3.8–10.8)

## 2023-01-08 LAB — COMPLETE METABOLIC PANEL WITH GFR
AG Ratio: 1.3 (calc) (ref 1.0–2.5)
ALT: 21 U/L (ref 6–29)
AST: 16 U/L (ref 10–35)
Albumin: 3.5 g/dL — ABNORMAL LOW (ref 3.6–5.1)
Alkaline phosphatase (APISO): 82 U/L (ref 37–153)
BUN: 15 mg/dL (ref 7–25)
CO2: 24 mmol/L (ref 20–32)
Calcium: 8.1 mg/dL — ABNORMAL LOW (ref 8.6–10.4)
Chloride: 102 mmol/L (ref 98–110)
Creat: 0.91 mg/dL (ref 0.50–1.05)
Globulin: 2.6 g/dL (calc) (ref 1.9–3.7)
Glucose, Bld: 111 mg/dL (ref 65–139)
Potassium: 3.5 mmol/L (ref 3.5–5.3)
Sodium: 137 mmol/L (ref 135–146)
Total Bilirubin: 0.7 mg/dL (ref 0.2–1.2)
Total Protein: 6.1 g/dL (ref 6.1–8.1)
eGFR: 70 mL/min/{1.73_m2} (ref >=60)

## 2023-01-08 LAB — D-DIMER, QUANTITATIVE: D-Dimer, Quant: 1.43 mcg/mL FEU — ABNORMAL HIGH (ref <0.50)

## 2023-01-10 ENCOUNTER — Telehealth: Payer: Self-pay

## 2023-01-10 NOTE — Transitions of Care (Post Inpatient/ED Visit) (Signed)
01/10/2023  Name: Ashley Hale MRN: [id number] DOB: Apr 16, 1961  Today's TOC FU Call Status:    Transition Care Management Follow-up Telephone Call Discharge Facility: Other (Non-[facility]) Name of Other (Non[facility]) Discharge Facility: [facility] Type of Discharge: Emergency Department Reason for ED Visit: Respiratory Respiratory Diagnosis: Pnuemonia How have you been since you were released from the hospital?: Better Any questions or concerns?: No  Items Reviewed: Did you receive and understand the discharge instructions provided?: Yes Any new allergies since your discharge?: No Dietary orders reviewed?: No Do you have support at home?: Yes People in Home: spouse  Medications Reviewed Today: Medications Reviewed Today     Reviewed by Lorre Munroe, NP (Nurse Practitioner) on 01/07/23 at 1548  Med List Status: <None>   Medication Order Taking? Sig Documenting Provider Last Dose Status Informant  BLACK COHOSH EXTRACT PO [id number] Yes Take 1 capsule by mouth daily. [provider] Taking Active Multiple Informants  buPROPion (WELLBUTRIN XL) 150 MG 24 hr tablet [id number] Yes TAKE 1 TABLET BY MOUTH ONCE DAILY Karamalegos, Netta Neat, DO Taking Active   Cholecalciferol (VITAMIN D3) 50 MCG (2000 UT) TABS [id number] Yes Take 2,000 Units by mouth daily. Smitty Cords, DO Taking Active Multiple Informants  cyclobenzaprine (FLEXERIL) 10 MG tablet [id number] Yes Take 0.5-1 tablets (5-10 mg total) by mouth 3 (three) times daily as needed for muscle spasms. Recommend start at night only  Patient taking differently: Take 5-10 mg by mouth 3 (three) times daily as needed for muscle spasms.   Smitty Cords, DO Taking Active Multiple Informants  fluticasone (FLONASE) 50 MCG/ACT nasal spray [id number] Yes Place 2 sprays into both nostrils daily. Use for 4-6 weeks then stop and use seasonally or as needed.  Patient taking differently: Place 2 sprays into both  nostrils daily as needed for rhinitis or allergies.   Smitty Cords, DO Taking Active Multiple Informants  furosemide (LASIX) 40 MG tablet [id number] Yes Take by mouth. [provider] Taking Active   hydrOXYzine (ATARAX/VISTARIL) 25 MG tablet [id number] Yes Take 1-2 tablets (25-50 mg total) by mouth at bedtime as needed for anxiety (insomnia). Smitty Cords, DO Taking Active Multiple Informants  ipratropium (ATROVENT) 0.06 % nasal spray [id number]  Place into the nose. [provider]  Active   loratadine (CLARITIN) 10 MG tablet [id number] Yes Take 1 tablet (10 mg total) by mouth daily. Use for 4-6 weeks then stop, and use as needed or seasonally Smitty Cords, DO Taking Active Multiple Informants  meclizine (ANTIVERT) 25 MG tablet [id number] Yes Take 1 tablet (25 mg total) by mouth 3 (three) times daily as needed for dizziness. Smitty Cords, DO Taking Active Multiple Informants  Multiple Vitamins-Minerals (HAIR/SKIN/NAILS) CAPS [id number] Yes Take 1 capsule by mouth daily. [provider] Taking Active Multiple Informants  omeprazole (PRILOSEC) 20 MG capsule [id number] Yes TAKE 1 CAPSULE BY MOUTH TWICE DAILY BEFORE A MEAL  Patient taking differently: Take 20 mg by mouth at bedtime.   Smitty Cords, DO Taking Active Multiple Informants  OZEMPIC, 2 MG/DOSE, 8 MG/3ML SOPN [id number] Yes Inject 2 mg into the skin once a week.  Patient taking differently: Inject 2 mg into the skin every Monday.   Smitty Cords, DO Taking Active Multiple Informants  PARoxetine (PAXIL) 20 MG tablet [id number] Yes Take 1 tablet (20 mg total) by mouth daily. Smitty Cords, DO Taking Active   rOPINIRole (REQUIP) 1 MG tablet [id number] Yes Take 1-2 tablets (1-2 mg total)  by mouth at bedtime. Smitty Cords, DO Taking Active   valACYclovir (VALTREX) 1000 MG tablet [id number] No Take 1 tablet (1,000 mg total) by mouth 2 (two)  times daily.  Patient not taking: Reported on 01/07/2023   Lorre Munroe, NP Not Taking Consider Medication Status and Discontinue (Completed Course)             Home Care and Equipment/Supplies: Were Home Health Services Ordered?: NA Any new equipment or medical supplies ordered?: NA  Functional Questionnaire: Do you need assistance with bathing/showering or dressing?: No Do you need assistance with meal preparation?: No Do you need assistance with eating?: No Do you have difficulty maintaining continence: No Do you need assistance with getting out of bed/getting out of a chair/moving?: No Do you have difficulty managing or taking your medications?: No  Follow up appointments reviewed: PCP Follow-up appointment confirmed?: Yes Specialist Hospital Follow-up appointment confirmed?: No Do you need transportation to your follow-up appointment?: No Do you understand care options if your condition(s) worsen?: Yes-patient verbalized understanding    SIGNATURE Laurel Dimmer, CMA

## 2023-01-12 ENCOUNTER — Ambulatory Visit: Payer: BC Managed Care – PPO | Admitting: Family Medicine

## 2023-01-12 ENCOUNTER — Encounter: Payer: Self-pay | Admitting: Family Medicine

## 2023-01-12 VITALS — BP 118/68 | HR 89 | Temp 99.1°F | Resp 18 | Ht 69.0 in | Wt 192.4 lb

## 2023-01-12 DIAGNOSIS — K219 Gastro-esophageal reflux disease without esophagitis: Secondary | ICD-10-CM | POA: Diagnosis not present

## 2023-01-12 DIAGNOSIS — Z1211 Encounter for screening for malignant neoplasm of colon: Secondary | ICD-10-CM | POA: Diagnosis not present

## 2023-01-12 DIAGNOSIS — R1310 Dysphagia, unspecified: Secondary | ICD-10-CM

## 2023-01-12 DIAGNOSIS — J690 Pneumonitis due to inhalation of food and vomit: Secondary | ICD-10-CM

## 2023-01-12 MED ORDER — PANTOPRAZOLE SODIUM 20 MG PO TBEC: 20.0000 mg | DELAYED_RELEASE_TABLET | Freq: Two times a day (BID) | ORAL | 1 refills | Status: DC

## 2023-01-12 NOTE — Progress Notes (Signed)
Subjective:    Patient ID: Ashley Hale, female    DOB: 1960-11-28, 62 y.o.   MRN: [id number]  Ashley Hale is a 62 y.o. female presenting on 01/12/2023 for Hospitalization Follow-up (Diagnose with Pneumonia on x 4 days. She still have some mild coughing, but denies any SOB or difficulty breathing. )   HPI  HOSPITAL FOLLOW-UP VISIT  Hospital/Location: [facility] ED Visit date 01/08/23 Transitions of care telephone call: completed by Laurel Dimmer CMA 01/10/23  Reason for Admission: dyspnea  - Hospital H&P and Discharge Summary have been reviewed - Patient presents today 4 days after recent hospitalization. Identified to have Left sided lower infiltrate.  Rene Kocher saw her 01/07/23, and CXR Not using CPAP currently, plan for future Earnest Bailey if possible  She went to [facility] ED on 6/22 after found positive D-Dimer elevated on prior labs.  Cleaning supplies occupational exhaustion at work  She can vomit quickly, even from deep breathing.   CTA Chest showed Left lower pneumonia infiltrate. COPD on X-ray but not accurate  Requesting Referral to [facility] - Colonoscopy and Upper Endoscopy   Fam history of bronchiectesis  Breathing was difficult initially > improvement notable over weekend,     I have reviewed the discharge medication list, and have reconciled the current and discharge medications today.   Current Outpatient Medications:    BLACK COHOSH EXTRACT PO, Take 1 capsule by mouth daily., Disp: , Rfl:    buPROPion (WELLBUTRIN XL) 150 MG 24 hr tablet, TAKE 1 TABLET BY MOUTH ONCE DAILY, Disp: 90 tablet, Rfl: 3   Cholecalciferol (VITAMIN D3) 50 MCG (2000 UT) TABS, Take 2,000 Units by mouth daily., Disp: , Rfl:    cyclobenzaprine (FLEXERIL) 10 MG tablet, Take 0.5-1 tablets (5-10 mg total) by mouth 3 (three) times daily as needed for muscle spasms. Recommend start at night only (Patient taking differently: Take 5-10 mg by mouth 3 (three) times daily as needed for muscle  spasms.), Disp: 30 tablet, Rfl: 2   fluticasone (FLONASE) 50 MCG/ACT nasal spray, Place 2 sprays into both nostrils daily. Use for 4-6 weeks then stop and use seasonally or as needed. (Patient taking differently: Place 2 sprays into both nostrils daily as needed for rhinitis or allergies.), Disp: 16 g, Rfl: 3   furosemide (LASIX) 40 MG tablet, Take by mouth., Disp: , Rfl:    hydrOXYzine (ATARAX/VISTARIL) 25 MG tablet, Take 1-2 tablets (25-50 mg total) by mouth at bedtime as needed for anxiety (insomnia)., Disp: 30 tablet, Rfl: 0   levofloxacin (LEVAQUIN) 500 MG tablet, Take 1 tablet (500 mg total) by mouth daily., Disp: 7 tablet, Rfl: 0   loratadine (CLARITIN) 10 MG tablet, Take 1 tablet (10 mg total) by mouth daily. Use for 4-6 weeks then stop, and use as needed or seasonally, Disp: 30 tablet, Rfl: 11   meclizine (ANTIVERT) 25 MG tablet, Take 1 tablet (25 mg total) by mouth 3 (three) times daily as needed for dizziness., Disp: 30 tablet, Rfl: 0   metroNIDAZOLE (FLAGYL) 500 MG tablet, Take by mouth., Disp: , Rfl:    Multiple Vitamins-Minerals (HAIR/SKIN/NAILS) CAPS, Take 1 capsule by mouth daily., Disp: , Rfl:    OZEMPIC, 2 MG/DOSE, 8 MG/3ML SOPN, Inject 2 mg into the skin once a week. (Patient taking differently: Inject 2 mg into the skin every Monday.), Disp: 9 mL, Rfl: 1   PARoxetine (PAXIL) 20 MG tablet, Take 1 tablet (20 mg total) by mouth daily., Disp: 90 tablet, Rfl: 3   rOPINIRole (REQUIP) 1 MG  tablet, Take 1-2 tablets (1-2 mg total) by mouth at bedtime., Disp: 180 tablet, Rfl: 3   valACYclovir (VALTREX) 1000 MG tablet, Take 1 tablet (1,000 mg total) by mouth 2 (two) times daily., Disp: 14 tablet, Rfl: 0   pantoprazole (PROTONIX) 20 MG tablet, Take 1 tablet (20 mg total) by mouth 2 (two) times daily before a meal., Disp: 180 tablet, Rfl: 1  ------------------------------------------------------------------------- Social History   Tobacco Use   Smoking status: Never   Smokeless tobacco:  Never  Vaping Use   Vaping Use: Never used  Substance Use Topics   Alcohol use: No    Alcohol/week: 0.0 standard drinks of alcohol   Drug use: No    Review of Systems Per HPI unless specifically indicated above     Objective:    BP 118/68 (BP Location: Left Arm)   Pulse 89   Temp 99.1 F (37.3 C) (Oral)   Resp 18   Ht 5\' 9"  (1.753 m)   Wt 192 lb 6.4 oz (87.3 kg)   SpO2 95%   BMI 28.41 kg/m   Wt Readings from Last 3 Encounters:  01/12/23 192 lb 6.4 oz (87.3 kg)  01/07/23 194 lb (88 kg)  12/27/22 190 lb 3.2 oz (86.3 kg)    Physical Exam Vitals and nursing note reviewed.  Constitutional:      General: She is not in acute distress.    Appearance: She is well-developed. She is not diaphoretic.     Comments: Well-appearing, comfortable, cooperative  HENT:     Head: Normocephalic and atraumatic.  Eyes:     General:        Right eye: No discharge.        Left eye: No discharge.     Conjunctiva/sclera: Conjunctivae normal.  Neck:     Thyroid: No thyromegaly.  Cardiovascular:     Rate and Rhythm: Normal rate and regular rhythm.     Heart sounds: Normal heart sounds. No murmur heard. Pulmonary:     Effort: Pulmonary effort is normal. No respiratory distress.     Breath sounds: Normal breath sounds. No wheezing or rales.  Musculoskeletal:        General: Normal range of motion.     Cervical back: Normal range of motion and neck supple.  Lymphadenopathy:     Cervical: No cervical adenopathy.  Skin:    General: Skin is warm and dry.     Findings: No erythema or rash.  Neurological:     Mental Status: She is alert and oriented to person, place, and time.  Psychiatric:        Behavior: Behavior normal.     Comments: Well groomed, good eye contact, normal speech and thoughts     I have personally reviewed the radiology report from 01/07/23 on CXR.   Study Result CLINICAL DATA: Shortness of breath  EXAM: CHEST - 2 VIEW  COMPARISON: Previous studies including  the examination of 08/23/2021  FINDINGS: Cardiac size is within normal limits. Increase in AP diameter of chest suggests COPD. New patchy infiltrate is seen in left lower lung field. Rest of the lung fields are clear. There is no pleural effusion or pneumothorax.  IMPRESSION: COPD. New patchy infiltrates are seen in left lower lung fields suggesting pneumonia.   Electronically Signed By: Ernie Avena M.D. On: 01/07/2023 16:42  -------------------------  CTA Chest W Contrast  Anatomical Region Laterality Modality  Chest -- Computed Tomography  Vascular -- --   Impression    No  pulmonary emboli.  Left lower lobe consolidation groundglass opacities, as shown on the chest radiograph consistent with pneumonia or aspiration. Recommend follow-up to radiographic resolution. Enlarged left hilar lymph nodes, likely reactive.   FOLLOW-UP RECOMMENDATION:  Item for Follow Up: Left lower lobe abnormalities. 1. Acuity: Subacute 2. Modality: XR 3. Anatomy: Chest 4. TimeFrame: 1 month Narrative  EXAM: CTA CHEST W CONTRAST ACCESSION: [id number] UN  CLINICAL INDICATION: SOB, no cough, no URI symptoms, patchy infiltrates on [facility] Xray yesterday, elevated D - dimer, r/o PE.  TECHNIQUE: Contiguous axial images were reconstructed through the chest following a single breath hold helical acquisition during the administration of intravenous contrast material. Images were reformatted in the coronal and sagittal planes. MIP slabs were also constructed.  COMPARISON: Radiograph earlier today showing a left lower lobe pneumonia.  FINDINGS:  PULMONARY ARTERIES: No emboli in either lung.  HEART AND VASCULATURE: Cardiac chambers are normal in size. No pericardial effusion. Aorta is normal in caliber.  LUNGS, AIRWAYS, AND PLEURA: Left lower lobe consolidation and groundglass opacities. Central airways are patent. Asymmetric left lower bronchial wall thickening. No pleural effusion or  pneumothorax.  MEDIASTINUM AND LYMPH NODES: Enlarged left hilar lymph nodes measuring up to 1.3 cm (5:86). Prominent mediastinal lymph nodes measuring up to 1.0 cm. No mediastinal mass or other abnormality.  CHEST WALL AND BONES: Multilevel degenerative changes of the spine.  UPPER ABDOMEN: Unremarkable.   Results for orders placed or performed in visit on 01/07/23  CBC  Result Value Ref Range   WBC 8.2 3.8 - 10.8 Thousand/uL   RBC 3.63 (L) 3.80 - 5.10 Million/uL   Hemoglobin 10.7 (L) 11.7 - 15.5 g/dL   HCT 98.1 (L) 19.1 - 47.8 %   MCV 86.8 80.0 - 100.0 fL   MCH 29.5 27.0 - 33.0 pg   MCHC 34.0 32.0 - 36.0 g/dL   RDW 29.5 62.1 - 30.8 %   Platelets 248 140 - 400 Thousand/uL   MPV 9.5 7.5 - 12.5 fL  COMPLETE METABOLIC PANEL WITH GFR  Result Value Ref Range   Glucose, Bld 111 65 - 139 mg/dL   BUN 15 7 - 25 mg/dL   Creat 6.57 8.46 - 9.62 mg/dL   eGFR 70 > OR = 60 XB/MWU/1.32G4   BUN/Creatinine Ratio SEE NOTE: 6 - 22 (calc)   Sodium 137 135 - 146 mmol/L   Potassium 3.5 3.5 - 5.3 mmol/L   Chloride 102 98 - 110 mmol/L   CO2 24 20 - 32 mmol/L   Calcium 8.1 (L) 8.6 - 10.4 mg/dL   Total Protein 6.1 6.1 - 8.1 g/dL   Albumin 3.5 (L) 3.6 - 5.1 g/dL   Globulin 2.6 1.9 - 3.7 g/dL (calc)   AG Ratio 1.3 1.0 - 2.5 (calc)   Total Bilirubin 0.7 0.2 - 1.2 mg/dL   Alkaline phosphatase (APISO) 82 37 - 153 U/L   AST 16 10 - 35 U/L   ALT 21 6 - 29 U/L  D-Dimer, Quantitative  Result Value Ref Range   D-Dimer, Quant 1.43 (H) <0.50 mcg/mL FEU      Assessment & Plan:   Problem List Items Addressed This Visit     GERD (gastroesophageal reflux disease)   Relevant Medications   pantoprazole (PROTONIX) 20 MG tablet   Other Relevant Orders   Ambulatory referral to Gastroenterology   Other Visit Diagnoses     Dysphagia, unspecified type    -  Primary   Relevant Medications   pantoprazole (PROTONIX) 20 MG tablet  Other Relevant Orders   Ambulatory referral to Gastroenterology   Colon  cancer screening       Relevant Orders   Ambulatory referral to Gastroenterology   Aspiration pneumonia of left lower lobe, unspecified aspiration pneumonia type (HCC)       Relevant Medications   metroNIDAZOLE (FLAGYL) 500 MG tablet   Other Relevant Orders   DG Chest 2 View      Aspiration pneumonia Uncertain etiology, may have dysphagia or other trigger Past history years ago similar  Re ordered Pantoprazole 20mg  twice a day re ordered 90 day to [place]  Referral to [facility] for COlonoscopy + Endoscopy we will request this and maybe future swallow eval for the aspiration  Repeat imaging for resolution - Chest X_ray ordered here at [facility] within 4 weeks of your last scan 6/22   Future Repeat CXR 4 weeks > then CT if needed. GI Referral  Orders Placed This Encounter  Procedures   DG Chest 2 View    Standing Status:   Future    Standing Expiration Date:   01/13/2024    Order Specific Question:   Reason for Exam (SYMPTOM  OR DIAGNOSIS REQUIRED)    Answer:   follow up resolution of aspiration pneumonia LLL    Order Specific Question:   Preferred imaging location?    Answer:   [facility]   Ambulatory referral to Gastroenterology    Referral Priority:   Routine    Referral Type:   Consultation    Referral Reason:   Specialty Services Required    Number of Visits Requested:   1     Meds ordered this encounter  Medications   pantoprazole (PROTONIX) 20 MG tablet    Sig: Take 1 tablet (20 mg total) by mouth 2 (two) times daily before a meal.    Dispense:  180 tablet    Refill:  1    Follow up plan: Return if symptoms worsen or fail to improve.   Saralyn Pilar, DO [facility] 01/12/2023, 2:38 PM

## 2023-01-12 NOTE — Patient Instructions (Addendum)
Thank you for coming to the office today.  Re ordered Pantoprazole 20mg  twice a day re ordered 90 day to [place]  Referral to [facility] for COlonoscopy + Endoscopy we will request this and maybe future swallow eval for the aspiration  Chest X_ray ordered here at [facility] within 4 weeks of your last scan 6/22  Please schedule a Follow-up Appointment to: Return if symptoms worsen or fail to improve.  If you have any other questions or concerns, please feel free to call the office or send a message through MyChart. You may also schedule an earlier appointment if necessary.  Additionally, you may be receiving a survey about your experience at our office within a few days to 1 week by e-mail or mail. We value your feedback.  Saralyn Pilar, DO [facility], [place]

## 2023-01-13 ENCOUNTER — Encounter: Payer: Self-pay | Admitting: Family Medicine

## 2023-02-26 ENCOUNTER — Other Ambulatory Visit: Payer: Self-pay | Admitting: Family Medicine

## 2023-02-26 DIAGNOSIS — E1169 Type 2 diabetes mellitus with other specified complication: Secondary | ICD-10-CM

## 2023-02-28 NOTE — Telephone Encounter (Signed)
Requested Prescriptions  Pending Prescriptions Disp Refills   OZEMPIC, 2 MG/DOSE, 8 MG/3ML SOPN [Pharmacy Med Name: OZEMPIC (2 MG/DOSE) 8 MG/3ML SUBQ S] 9 mL 1    Sig: INJECT 2MG  SUBCUTANEOUSLY ONCE A WEEK     Endocrinology:  Diabetes - GLP-1 Receptor Agonists - semaglutide Passed - 02/26/2023  9:14 AM      Passed - HBA1C in normal range and within 180 days    Hgb A1c MFr Bld  Date Value Ref Range Status  12/20/2022 5.5 <5.7 % of total Hgb Final    Comment:    For the purpose of screening for the presence of diabetes: . <5.7%       Consistent with the absence of diabetes 5.7-6.4%    Consistent with increased risk for diabetes             (prediabetes) > or =6.5%  Consistent with diabetes . This assay result is consistent with a decreased risk of diabetes. . Currently, no consensus exists regarding use of hemoglobin A1c for diagnosis of diabetes in children. . According to American Diabetes Association (ADA) guidelines, hemoglobin A1c <7.0% represents optimal control in non-pregnant diabetic patients. Different metrics may apply to specific patient populations.  Standards of Medical Care in Diabetes(ADA). .          Passed - Cr in normal range and within 360 days    Creat  Date Value Ref Range Status  01/07/2023 0.91 0.50 - 1.05 mg/dL Final   Creatinine, Urine  Date Value Ref Range Status  06/21/2022 79 20 - 275 mg/dL Final         Passed - Valid encounter within last 6 months    Recent Outpatient Visits           1 month ago Dysphagia, unspecified type   [place] [facility] [place], Netta Neat, DO   1 month ago Shortness of breath   [facility] [place], Salvadore Oxford, NP   2 months ago Annual physical exam   [place] [facility] Smitty Cords, DO   4 months ago Lesion of lip   [facility] [place], Salvadore Oxford, NP   7 months ago Decreased appetite   [place]  [facility] [place], Salvadore Oxford, NP       Future Appointments             In 4 months Althea Charon, Netta Neat, DO  [facility], [facility]

## 2023-03-15 ENCOUNTER — Telehealth (INDEPENDENT_AMBULATORY_CARE_PROVIDER_SITE_OTHER): Payer: BC Managed Care – PPO | Admitting: Internal Medicine

## 2023-03-15 ENCOUNTER — Telehealth: Payer: BC Managed Care – PPO | Admitting: Family Medicine

## 2023-03-15 ENCOUNTER — Encounter: Payer: Self-pay | Admitting: Internal Medicine

## 2023-03-15 DIAGNOSIS — U071 COVID-19: Secondary | ICD-10-CM

## 2023-03-15 MED ORDER — NIRMATRELVIR/RITONAVIR (PAXLOVID)TABLET: 3.0000 | ORAL_TABLET | Freq: Two times a day (BID) | ORAL | 0 refills | Status: AC

## 2023-03-15 MED ORDER — PROMETHAZINE-DM 6.25-15 MG/5ML PO SYRP: 5.0000 mL | ORAL_SOLUTION | Freq: Four times a day (QID) | ORAL | 0 refills | Status: DC | PRN

## 2023-03-15 NOTE — Progress Notes (Signed)
Virtual Visit via Video Note  I connected with Ashley Hale on 03/15/23 at 10:20 AM EDT by a video enabled telemedicine application and verified that I am speaking with the correct person using two identifiers.  Location: Patient: In her car Provider: Office  Person's participating in this video call: Nicki Reaper and [place]   I discussed the limitations of evaluation and management by telemedicine and the availability of in person appointments. The patient expressed understanding and agreed to proceed.  History of Present Illness:  Pt reports fatigue, headache, runny nose, sore throat and cough. This started yesterday.  She is blowing yellow mucous out of her nose. She is having difficulty swallowing.  The cough is productive of yellow mucus.  She denies ear pain, shortness of breath, chest pain, nausea, vomiting or diarrhea.  She denies fever, chills or body aches.  She has not tried anything OTC for this.  She has tested positive for covid.   Past Medical History:  Diagnosis Date   GERD (gastroesophageal reflux disease)    IBS (irritable bowel syndrome)    Obesity     Current Outpatient Medications  Medication Sig Dispense Refill   BLACK COHOSH EXTRACT PO Take 1 capsule by mouth daily.     buPROPion (WELLBUTRIN XL) 150 MG 24 hr tablet TAKE 1 TABLET BY MOUTH ONCE DAILY 90 tablet 3   Cholecalciferol (VITAMIN D3) 50 MCG (2000 UT) TABS Take 2,000 Units by mouth daily.     cyclobenzaprine (FLEXERIL) 10 MG tablet Take 0.5-1 tablets (5-10 mg total) by mouth 3 (three) times daily as needed for muscle spasms. Recommend start at night only (Patient taking differently: Take 5-10 mg by mouth 3 (three) times daily as needed for muscle spasms.) 30 tablet 2   fluticasone (FLONASE) 50 MCG/ACT nasal spray Place 2 sprays into both nostrils daily. Use for 4-6 weeks then stop and use seasonally or as needed. (Patient taking differently: Place 2 sprays into both nostrils daily as needed for  rhinitis or allergies.) 16 g 3   furosemide (LASIX) 40 MG tablet Take by mouth.     hydrOXYzine (ATARAX/VISTARIL) 25 MG tablet Take 1-2 tablets (25-50 mg total) by mouth at bedtime as needed for anxiety (insomnia). 30 tablet 0   levofloxacin (LEVAQUIN) 500 MG tablet Take 1 tablet (500 mg total) by mouth daily. 7 tablet 0   loratadine (CLARITIN) 10 MG tablet Take 1 tablet (10 mg total) by mouth daily. Use for 4-6 weeks then stop, and use as needed or seasonally 30 tablet 11   meclizine (ANTIVERT) 25 MG tablet Take 1 tablet (25 mg total) by mouth 3 (three) times daily as needed for dizziness. 30 tablet 0   Multiple Vitamins-Minerals (HAIR/SKIN/NAILS) CAPS Take 1 capsule by mouth daily.     OZEMPIC, 2 MG/DOSE, 8 MG/3ML SOPN INJECT 2MG  SUBCUTANEOUSLY ONCE A WEEK 9 mL 1   pantoprazole (PROTONIX) 20 MG tablet Take 1 tablet (20 mg total) by mouth 2 (two) times daily before a meal. 180 tablet 1   PARoxetine (PAXIL) 20 MG tablet Take 1 tablet (20 mg total) by mouth daily. 90 tablet 3   rOPINIRole (REQUIP) 1 MG tablet Take 1-2 tablets (1-2 mg total) by mouth at bedtime. 180 tablet 3   valACYclovir (VALTREX) 1000 MG tablet Take 1 tablet (1,000 mg total) by mouth 2 (two) times daily. 14 tablet 0   No current facility-administered medications for this visit.    Allergies  Allergen Reactions   Other Other (See Comments)  GAUZE used for dressing = Bleeding and severe "skin stinging"   Shellfish Allergy Nausea And Vomiting   Sulfa Antibiotics Hives and Other (See Comments)    STOMACH CRAMPS, also    Family History  Problem Relation Age of Onset   Heart disease Mother    Hypertension Mother    Hyperlipidemia Mother    Heart disease Father    Glaucoma Father    Breast cancer Neg Hx     Social History   Socioeconomic History   Marital status: Married    Spouse name: Not on file   Number of children: Not on file   Years of education: Not on file   Highest education level: Not on file   Occupational History   Not on file  Tobacco Use   Smoking status: Never   Smokeless tobacco: Never  Vaping Use   Vaping status: Never Used  Substance and Sexual Activity   Alcohol use: No    Alcohol/week: 0.0 standard drinks of alcohol   Drug use: No   Sexual activity: Not on file  Other Topics Concern   Not on file  Social History Narrative   Not on file   Social Determinants of Health   Financial Resource Strain: Not on file  Food Insecurity: No Food Insecurity (10/14/2022)   Hunger Vital Sign    Worried About Running Out of Food in the Last Year: Never true    Ran Out of Food in the Last Year: Never true  Transportation Needs: No Transportation Needs (10/14/2022)   PRAPARE - Administrator, Civil Service (Medical): No    Lack of Transportation (Non-Medical): No  Physical Activity: Not on file  Stress: Not on file  Social Connections: Not on file  Intimate Partner Violence: Not At Risk (10/14/2022)   Humiliation, Afraid, Rape, and Kick questionnaire    Fear of Current or Ex-Partner: No    Emotionally Abused: No    Physically Abused: No    Sexually Abused: No     Constitutional: Patient reports headache and fatigue.  Denies fever, malaise or abrupt weight changes.  HEENT: Patient reports runny nose and sore throat.  Denies eye pain, eye redness, ear pain, ringing in the ears, wax buildup, nasal congestion, bloody nose. Respiratory: Patient reports cough.  Denies difficulty breathing, shortness of breath.   Cardiovascular: Denies chest pain, chest tightness, palpitations or swelling in the hands or feet.  Gastrointestinal: Denies abdominal pain, bloating, constipation, diarrhea or blood in the stool.   No other specific complaints in a complete review of systems (except as listed in HPI above).  Observations/Objective:  There were no vitals taken for this visit. Wt Readings from Last 3 Encounters:  01/12/23 192 lb 6.4 oz (87.3 kg)  01/07/23 194 lb (88  kg)  12/27/22 190 lb 3.2 oz (86.3 kg)    General: Appears her stated age, appears unwell but in NAD. HEENT: Nose: Congestion noted; Throat/Mouth: Hoarseness noted Pulmonary/Chest: Normal effort. No respiratory distress.   Neurological: Alert and oriented.   BMET    Component Value Date/Time   NA 137 01/07/2023 1558   K 3.5 01/07/2023 1558   CL 102 01/07/2023 1558   CO2 24 01/07/2023 1558   GLUCOSE 111 01/07/2023 1558   BUN 15 01/07/2023 1558   CREATININE 0.91 01/07/2023 1558   CALCIUM 8.1 (L) 01/07/2023 1558   GFRNONAA >60 10/14/2022 1324   GFRNONAA 59 (L) 11/03/2018 0837   GFRAA 68 11/03/2018 1610  Lipid Panel     Component Value Date/Time   CHOL 178 12/20/2022 0837   TRIG 85 12/20/2022 0837   HDL 65 12/20/2022 0837   CHOLHDL 2.7 12/20/2022 0837   VLDL 31 09/09/2015 0745   LDLCALC 95 12/20/2022 0837    CBC    Component Value Date/Time   WBC 8.2 01/07/2023 1558   RBC 3.63 (L) 01/07/2023 1558   HGB 10.7 (L) 01/07/2023 1558   HGB 11.7 09/15/2022 1057   HCT 31.5 (L) 01/07/2023 1558   HCT 35.7 09/15/2022 1057   PLT 248 01/07/2023 1558   PLT 281 09/15/2022 1057   MCV 86.8 01/07/2023 1558   MCV 87 09/15/2022 1057   MCH 29.5 01/07/2023 1558   MCHC 34.0 01/07/2023 1558   RDW 12.5 01/07/2023 1558   RDW 12.6 09/15/2022 1057   LYMPHSABS 1,512 12/20/2022 0837   LYMPHSABS 1.9 09/15/2022 1057   MONOABS 1.0 (H) 07/25/2017 1713   EOSABS 171 12/20/2022 0837   EOSABS 0.2 09/15/2022 1057   BASOSABS 41 12/20/2022 0837   BASOSABS 0.0 09/15/2022 1057    Hgb A1C Lab Results  Component Value Date   HGBA1C 5.5 12/20/2022        Assessment and Plan:  Covid 19:  Encouraged rest and fluids Can take Tylenol OTC as needed if fever develops Recommend Zyrtec and Flonase OTC for symptom management Rx for Paxlovid 3 tabs twice daily x 5 days Rx for Promethazine DM cough syrup She declines Rx for steroids or inhaler at this time  Follow-up if symptoms persist or  worsen  Follow Up Instructions:    I discussed the assessment and treatment plan with the patient. The patient was provided an opportunity to ask questions and all were answered. The patient agreed with the plan and demonstrated an understanding of the instructions.   The patient was advised to call back or seek an in-person evaluation if the symptoms worsen or if the condition fails to improve as anticipated.    Nicki Reaper, NP

## 2023-03-15 NOTE — Progress Notes (Signed)
  Thank you for the details you included in the comment boxes. Those details are very helpful in determining the best course of treatment for you and help [place] to provide the best care.Because covid + and you would like an antiviral, we recommend that you convert this visit to a video visit in order for the provider to better assess what is going on.  The provider will be able to give you a more accurate diagnosis and treatment plan if we can more freely discuss your symptoms and with the addition of a virtual examination.   If you convert to a video visit, we will bill your insurance (similar to an office visit) and you will not be charged for this e-Visit. You will be able to stay at home and speak with the first available [facility] advanced practice provider. The link to do a video visit is in the drop down Menu tab of your Welcome screen in MyChart.

## 2023-03-15 NOTE — Patient Instructions (Signed)

## 2023-04-06 ENCOUNTER — Encounter: Payer: Self-pay | Admitting: Family Medicine

## 2023-04-06 DIAGNOSIS — E1169 Type 2 diabetes mellitus with other specified complication: Secondary | ICD-10-CM

## 2023-04-07 MED ORDER — OZEMPIC (2 MG/DOSE) 8 MG/3ML ~~LOC~~ SOPN
PEN_INJECTOR | SUBCUTANEOUS | 1 refills | Status: DC
Start: 2023-04-07 — End: 2024-01-16

## 2023-04-18 ENCOUNTER — Encounter: Payer: Self-pay | Admitting: Gastroenterology

## 2023-04-18 ENCOUNTER — Other Ambulatory Visit: Payer: Self-pay

## 2023-04-18 ENCOUNTER — Ambulatory Visit (INDEPENDENT_AMBULATORY_CARE_PROVIDER_SITE_OTHER): Payer: BC Managed Care – PPO | Admitting: Gastroenterology

## 2023-04-18 VITALS — BP 112/72 | HR 83 | Temp 98.1°F | Ht 69.0 in | Wt 191.8 lb

## 2023-04-18 DIAGNOSIS — R1319 Other dysphagia: Secondary | ICD-10-CM

## 2023-04-18 DIAGNOSIS — K219 Gastro-esophageal reflux disease without esophagitis: Secondary | ICD-10-CM | POA: Diagnosis not present

## 2023-04-18 DIAGNOSIS — Z8701 Personal history of pneumonia (recurrent): Secondary | ICD-10-CM

## 2023-04-18 DIAGNOSIS — Z1211 Encounter for screening for malignant neoplasm of colon: Secondary | ICD-10-CM

## 2023-04-18 DIAGNOSIS — R1310 Dysphagia, unspecified: Secondary | ICD-10-CM | POA: Diagnosis not present

## 2023-04-18 MED ORDER — SUTAB 1479-225-188 MG PO TABS: ORAL_TABLET | ORAL | 0 refills | Status: DC

## 2023-04-18 NOTE — Progress Notes (Signed)
Wyline Mood MD, MRCP(U.K) [address]  [address]  [place], [place] [address]  Main: [phone]  Fax: [phone]   Gastroenterology Consultation  Referring Provider:     Saralyn Pilar * Primary Care Physician:  Smitty Cords, DO Primary Gastroenterologist:  Dr. Wyline Mood  Reason for Consultation:     Dysphagia         HPI:   Ashley Hale is a 62 y.o. y/o female referred for consultation & management  by Dr. Althea Charon, Netta Neat, DO.     She was simply discharged from the hospital in June 2024 after an episode of pneumonia attributed to aspiration.  Seen Dr. Althea Charon on 01/12/2023 and was concerns for dysphagia and referred to see [place] for further evaluation.  Chest x-ray from 01/07/2023 shows new patchy infiltrate seen in the left lower lung field suggesting pneumonia.  No recent colonoscopy on record.  01/07/2023 hemoglobin 10.7 g MCV of 86.83 months back hemoglobin is 12.5 g.  CMP normal.   She has an issue with dysphagia intermittently presently has no issues but has had it over the years.  Comes and goes associated with heartburn.  Has been treated with omeprazole in the past for acid reflux she has been losing weight presently intentionally as she started on Ozempic.  She suffers from sleep apnea.  She has history of regurgitation as well.  No shortness of breath.  Can lay flat can climb up stairs.  No colonoscopy recently.  No recent upper endoscopy either.  No known allergies.  No history of smoking.  No family history of esophageal cancer. Past Medical History:  Diagnosis Date   GERD (gastroesophageal reflux disease)    IBS (irritable bowel syndrome)    Obesity     Past Surgical History:  Procedure Laterality Date   ABDOMINAL HYSTERECTOMY     AMPUTATION TOE Right 10/15/2022   Procedure: AMPUTATION TOE, RIGHT THIRD TOE;  Surgeon: Edwin Cap, DPM;  Location: [facility];  Service: Podiatry;  Laterality: Right;   BACK SURGERY  1992   Lumbar  spine, herniated disc    Prior to Admission medications   Medication Sig Start Date End Date Taking? Authorizing Provider  BLACK COHOSH EXTRACT PO Take 1 capsule by mouth daily.    [provider]  buPROPion (WELLBUTRIN XL) 150 MG 24 hr tablet TAKE 1 TABLET BY MOUTH ONCE DAILY 12/07/22   Althea Charon, Netta Neat, DO  Cholecalciferol (VITAMIN D3) 50 MCG (2000 UT) TABS Take 2,000 Units by mouth daily. 02/11/20   Karamalegos, Netta Neat, DO  cyclobenzaprine (FLEXERIL) 10 MG tablet Take 0.5-1 tablets (5-10 mg total) by mouth 3 (three) times daily as needed for muscle spasms. Recommend start at night only Patient taking differently: Take 5-10 mg by mouth 3 (three) times daily as needed for muscle spasms. 11/28/17   Karamalegos, Netta Neat, DO  fluticasone (FLONASE) 50 MCG/ACT nasal spray Place 2 sprays into both nostrils daily. Use for 4-6 weeks then stop and use seasonally or as needed. Patient taking differently: Place 2 sprays into both nostrils daily as needed for rhinitis or allergies. 07/27/21   Karamalegos, Netta Neat, DO  furosemide (LASIX) 40 MG tablet Take by mouth. 06/29/13   [provider]  hydrOXYzine (ATARAX/VISTARIL) 25 MG tablet Take 1-2 tablets (25-50 mg total) by mouth at bedtime as needed for anxiety (insomnia). 11/28/17   Karamalegos, Netta Neat, DO  levofloxacin (LEVAQUIN) 500 MG tablet Take 1 tablet (500 mg total) by mouth daily. 01/07/23  Lorre Munroe, NP  loratadine (CLARITIN) 10 MG tablet Take 1 tablet (10 mg total) by mouth daily. Use for 4-6 weeks then stop, and use as needed or seasonally 06/23/16   Althea Charon, Netta Neat, DO  meclizine (ANTIVERT) 25 MG tablet Take 1 tablet (25 mg total) by mouth 3 (three) times daily as needed for dizziness. 07/27/21   Karamalegos, Netta Neat, DO  Multiple Vitamins-Minerals (HAIR/SKIN/NAILS) CAPS Take 1 capsule by mouth daily.    [provider]  OZEMPIC, 2 MG/DOSE, 8 MG/3ML SOPN INJECT 2MG  SUBCUTANEOUSLY ONCE A  WEEK 04/07/23   Karamalegos, Netta Neat, DO  pantoprazole (PROTONIX) 20 MG tablet Take 1 tablet (20 mg total) by mouth 2 (two) times daily before a meal. 01/12/23   Karamalegos, Netta Neat, DO  PARoxetine (PAXIL) 20 MG tablet Take 1 tablet (20 mg total) by mouth daily. 12/27/22   Karamalegos, Netta Neat, DO  promethazine-dextromethorphan (PROMETHAZINE-DM) 6.25-15 MG/5ML syrup Take 5 mLs by mouth 4 (four) times daily as needed. 03/15/23   Lorre Munroe, NP  rOPINIRole (REQUIP) 1 MG tablet Take 1-2 tablets (1-2 mg total) by mouth at bedtime. 12/27/22   Karamalegos, Netta Neat, DO  valACYclovir (VALTREX) 1000 MG tablet Take 1 tablet (1,000 mg total) by mouth 2 (two) times daily. 10/28/22   Lorre Munroe, NP    Family History  Problem Relation Age of Onset   Heart disease Mother    Hypertension Mother    Hyperlipidemia Mother    Heart disease Father    Glaucoma Father    Breast cancer Neg Hx      Social History   Tobacco Use   Smoking status: Never   Smokeless tobacco: Never  Vaping Use   Vaping status: Never Used  Substance Use Topics   Alcohol use: No    Alcohol/week: 0.0 standard drinks of alcohol   Drug use: No    Allergies as of 04/18/2023 - Review Complete 04/18/2023  Allergen Reaction Noted   Other Other (See Comments) 10/14/2022   Shellfish allergy Nausea And Vomiting 10/13/2016   Sulfa antibiotics Hives and Other (See Comments) 11/04/2014    Review of Systems:    All systems reviewed and negative except where noted in HPI.   Physical Exam:  BP 112/72   Pulse 83   Temp 98.1 F (36.7 C) (Oral)   Ht 5\' 9"  (1.753 m)   Wt 191 lb 12.8 oz (87 kg)   BMI 28.32 kg/m  No LMP recorded. Patient has had a hysterectomy. Psych:  Alert and cooperative. Normal mood and affect. General:   Alert,  Well-developed, well-nourished, pleasant and cooperative in NAD Head:  Normocephalic and atraumatic. Eyes:  Sclera clear, no icterus.   Conjunctiva pink. Ears:  Normal auditory  acuity. Neck:  Supple; no masses or thyromegaly. Lungs:  Respirations even and unlabored.  Clear throughout to auscultation.   No wheezes, crackles, or rhonchi. No acute distress. Heart:  Regular rate and rhythm; no murmurs, clicks, rubs, or gallops. Neurologic:  Alert and oriented x3;  grossly normal neurologically. Psych:  Alert and cooperative. Normal mood and affect.  Imaging Studies: No results found.  Assessment and Plan:   Ashley Hale is a 62 y.o. y/o female has been referred for dysphagia and colon cancer screening.  Recent episode of aspiration pneumonia in June 2024 admitted to the hospital with abnormal chest x-ray.  Appears she may have baseline acid reflux which may have been worsened after starting Ozempic and possibly has also had  gastroparesis.  Plan 1.  EGD to evaluate for dysphagia, rule out gastric outlet obstruction rule out EOE.  At the same time we will perform colonoscopy for colon cancer screening average risk.  2.  At follow-up based on the results of EGD and discuss steps to help with reflux   I have discussed alternative options, risks & benefits,  which include, but are not limited to, bleeding, infection, perforation,respiratory complication & drug reaction.  The patient agrees with this plan & written consent will be obtained.     Follow up in 3 months with Celso Amy  Dr Wyline Mood MD,MRCP(U.K)

## 2023-04-19 ENCOUNTER — Telehealth: Payer: Self-pay

## 2023-04-19 NOTE — Telephone Encounter (Signed)
Called patient to let her know that I was able to get her Sutabs approved by her insurance. Therefore, I told her that I was faxing her pharmacy the approval letter so they could run it through and have it ready to be picked up. Patient understood and stated that she would go later on today to get it.

## 2023-05-11 ENCOUNTER — Telehealth: Payer: Self-pay

## 2023-05-11 ENCOUNTER — Ambulatory Visit
Admission: RE | Admit: 2023-05-11 | Discharge: 2023-05-11 | Disposition: A | Discharge status: Home / Self Care | Payer: BC Managed Care – PPO | Attending: Gastroenterology | Admitting: Gastroenterology

## 2023-05-11 ENCOUNTER — Encounter: Payer: Self-pay | Admitting: Certified Registered"

## 2023-05-11 ENCOUNTER — Encounter
Admission: RE | Disposition: A | Discharge status: Home / Self Care | Payer: Self-pay | Source: Home / Self Care | Attending: Gastroenterology

## 2023-05-11 ENCOUNTER — Other Ambulatory Visit: Payer: Self-pay

## 2023-05-11 DIAGNOSIS — Z1211 Encounter for screening for malignant neoplasm of colon: Secondary | ICD-10-CM | POA: Diagnosis present

## 2023-05-11 DIAGNOSIS — R1319 Other dysphagia: Secondary | ICD-10-CM

## 2023-05-11 DIAGNOSIS — Z538 Procedure and treatment not carried out for other reasons: Secondary | ICD-10-CM | POA: Insufficient documentation

## 2023-05-11 SURGERY — COLONOSCOPY WITH PROPOFOL
Anesthesia: General

## 2023-05-11 MED ORDER — SODIUM CHLORIDE 0.9 % IV SOLN: INTRAVENOUS | Status: DC

## 2023-05-11 MED ORDER — NA SULFATE-K SULFATE-MG SULF 17.5-3.13-1.6 GM/177ML PO SOLN: 1.0000 | Freq: Once | ORAL | 0 refills | Status: AC

## 2023-05-11 MED ORDER — GLYCOPYRROLATE 0.2 MG/ML IJ SOLN
INTRAMUSCULAR | Status: AC
Start: 2023-05-11 — End: ?
  Filled 2023-05-11: qty 1

## 2023-05-11 MED ORDER — MIDAZOLAM HCL 2 MG/2ML IJ SOLN
INTRAMUSCULAR | Status: AC
  Filled 2023-05-11: qty 2

## 2023-05-11 NOTE — Telephone Encounter (Signed)
Voice message has been left for patient to call me back asap the date discussed is not available and we need to pick another date.  Pt does not take Metformin anymore.  Has been advised to remain off Ozempic until after procedures.  2 Day Prep Instructions w/ Miralax Gatorade and Rx Suprep  2 days prior to procedure: Clear Liquid Diet   - Do not eat any solid foods or dairy products of any kind after 12 noon.  -  4 pm-  Mix 64 ounces of Gatorade with 238 grams of miralax. Drink 8oz every 20 minutes till solution is gone.   - Take your usual prescription medications ( except iron and / or any other stopped medications)   Take your usual prescription medications (except iron and / or any other stopped medications)   1 day prior to procedure :Clear Liquid Diet -Suprep at 5pm mix with 16 oz of clear liquid -Suprep 5 hours prior to colonoscopy

## 2023-05-11 NOTE — Telephone Encounter (Signed)
Trish from the endo unit called stating that the patient had given herself the Ozempic within this week even though she was informed when instructions were given. Patient is to hold her Ozempic for 7 days, Metformin 2 days and victoza 7 days. I will make sure what medications she is currently taking.

## 2023-05-11 NOTE — Telephone Encounter (Signed)
Patient called left a voicemail stating she needs to reschedule her procedure. I called the patient back to let her know we received her voicemail and her message has been sent.

## 2023-05-11 NOTE — Telephone Encounter (Signed)
Discussed rescheduling colonoscopy with EGD to Monday 10/25 however after giving patient this date realized Dr.Vanga is not scoping all day,  Contacted patient to request that she calls back asap to r/s to another date.  The soonest date that I see available with any provider will be 06/08/23 with Dr. Allegra Lai. Mychart message sent to her also requesting call back.  At this time I am going to place her on for Dr. Allegra Lai  06/08/23 and wait for her to call me back.  Thanks, [place], [place]

## 2023-05-11 NOTE — Telephone Encounter (Signed)
Dr. Tobi Bastos has requested patient to be rescheduled for her Colonoscopy w/EGD due to her not being cleaned out.  He stated to schedule with any provider if he doesn't have anything per secure chat message.  I will contact patient later on this morning to allow time for her to get home and get these procedures rescheduled with 2 day prep.  Diagnosis: screening colonoscopy Z12.11 Dysphagia R13.19/egd 43235 colonoscopy 96045   ThanksMarcelino Duster, CMA

## 2023-05-11 NOTE — OR Nursing (Signed)
Patient was not clean enough to do the procedure. Spoke with her about side effects of Ozempic. Dr Tobi Bastos to order 2 day prep

## 2023-05-11 NOTE — Telephone Encounter (Signed)
Per [employer] Rep [phone] No prior auth required. After Nov 1 no prior auth required for this patient plan [id number], [phone]

## 2023-05-12 NOTE — Telephone Encounter (Signed)
Patient has requested to reschedule from 11/26 due to she will be busy Thanksgiving week.  Procedures have been rescheduled to 06/20/23.  Vikki in Endo notified.   Referral updated.  Instructions updated.  Thanks,  [place], [place]

## 2023-05-12 NOTE — Telephone Encounter (Signed)
Contacted patient to ensure that she got my message.  She said she did receive and since she has to wait-she would just prefer Dr.Anna to do both procedures.  Patient instructions were noted in bold to stop ozempic and victoza (7) days prior to colonoscopy. She no longer takes metformin.  Patient has been rescheduled from 06/08/23 with Dr. Allegra Lai to 06/14/23 with Tobi Bastos.  Vikkie in endo notified.  Instructions updated.  Referral updated.  Thanks,  [place], [place]

## 2023-05-12 NOTE — Telephone Encounter (Signed)
Ashley Hale was able to reschedule her procedure.

## 2023-06-06 LAB — HM DIABETES EYE EXAM

## 2023-06-14 ENCOUNTER — Encounter: Payer: Self-pay | Admitting: Family Medicine

## 2023-06-20 ENCOUNTER — Ambulatory Visit: Payer: BC Managed Care – PPO | Admitting: Certified Registered"

## 2023-06-20 ENCOUNTER — Ambulatory Visit
Admission: RE | Admit: 2023-06-20 | Discharge: 2023-06-20 | Disposition: A | Discharge status: Home / Self Care | Payer: BC Managed Care – PPO | Attending: Gastroenterology | Admitting: Gastroenterology

## 2023-06-20 ENCOUNTER — Encounter: Payer: Self-pay | Admitting: Gastroenterology

## 2023-06-20 ENCOUNTER — Encounter
Admission: RE | Disposition: A | Discharge status: Home / Self Care | Payer: Self-pay | Source: Home / Self Care | Attending: Gastroenterology

## 2023-06-20 DIAGNOSIS — Z89421 Acquired absence of other right toe(s): Secondary | ICD-10-CM | POA: Diagnosis not present

## 2023-06-20 DIAGNOSIS — Z90710 Acquired absence of both cervix and uterus: Secondary | ICD-10-CM | POA: Diagnosis not present

## 2023-06-20 DIAGNOSIS — K219 Gastro-esophageal reflux disease without esophagitis: Secondary | ICD-10-CM | POA: Diagnosis not present

## 2023-06-20 DIAGNOSIS — R1319 Other dysphagia: Secondary | ICD-10-CM

## 2023-06-20 DIAGNOSIS — R1310 Dysphagia, unspecified: Secondary | ICD-10-CM | POA: Diagnosis not present

## 2023-06-20 DIAGNOSIS — E119 Type 2 diabetes mellitus without complications: Secondary | ICD-10-CM | POA: Diagnosis not present

## 2023-06-20 DIAGNOSIS — Z1211 Encounter for screening for malignant neoplasm of colon: Secondary | ICD-10-CM | POA: Diagnosis not present

## 2023-06-20 HISTORY — PX: COLONOSCOPY WITH PROPOFOL: SHX5780

## 2023-06-20 HISTORY — PX: ESOPHAGOGASTRODUODENOSCOPY (EGD) WITH PROPOFOL: SHX5813

## 2023-06-20 HISTORY — DX: Depression, unspecified: F32.A

## 2023-06-20 HISTORY — DX: Anxiety disorder, unspecified: F41.9

## 2023-06-20 HISTORY — DX: Sleep apnea, unspecified: G47.30

## 2023-06-20 SURGERY — COLONOSCOPY WITH PROPOFOL
Anesthesia: General

## 2023-06-20 MED ORDER — SODIUM CHLORIDE 0.9 % IV SOLN: INTRAVENOUS | Status: DC

## 2023-06-20 MED ORDER — PROPOFOL 10 MG/ML IV BOLUS
INTRAVENOUS | Status: DC | PRN
  Administered 2023-06-20: 10 mg via INTRAVENOUS
  Administered 2023-06-20 (×2): 20 mg via INTRAVENOUS
  Administered 2023-06-20: 100 mg via INTRAVENOUS

## 2023-06-20 MED ORDER — PROPOFOL 500 MG/50ML IV EMUL
INTRAVENOUS | Status: DC | PRN
  Administered 2023-06-20: 150 ug/kg/min via INTRAVENOUS

## 2023-06-20 MED ORDER — LIDOCAINE HCL (CARDIAC) PF 100 MG/5ML IV SOSY
PREFILLED_SYRINGE | INTRAVENOUS | Status: DC | PRN
  Administered 2023-06-20: 100 mg via INTRAVENOUS

## 2023-06-20 NOTE — Anesthesia Postprocedure Evaluation (Signed)
Anesthesia Post Note  Patient: Ashley Hale  Procedure(s) Performed: COLONOSCOPY WITH PROPOFOL ESOPHAGOGASTRODUODENOSCOPY (EGD) WITH PROPOFOL  Patient location during evaluation: Endoscopy Anesthesia Type: General Level of consciousness: awake and alert Pain management: pain level controlled Vital Signs Assessment: post-procedure vital signs reviewed and stable Respiratory status: spontaneous breathing, nonlabored ventilation, respiratory function stable and patient connected to nasal cannula oxygen Cardiovascular status: blood pressure returned to baseline and stable Postop Assessment: no apparent nausea or vomiting Anesthetic complications: no   No notable events documented.   Last Vitals:  Vitals:   06/20/23 0909 06/20/23 0919  BP: 97/67 112/78  Pulse: 84 71  Resp: 14 14  Temp:    SpO2: 99% 100%    Last Pain:  Vitals:   06/20/23 0919  TempSrc:   PainSc: 0-No pain                 Cleda Mccreedy Kalid Ghan

## 2023-06-20 NOTE — Op Note (Signed)
[facility] Gastroenterology Patient Name: Ashley Hale Procedure Date: 06/20/2023 8:18 AM MRN: [id number] Account #: [id number] Date of Birth: [birth year] Admit Type: Outpatient Age: 62 Room: [facility] ENDO ROOM 1 Gender: Female Note Status: Finalized Instrument Name: Upper Endoscope [id number] Procedure:             Upper GI endoscopy Indications:           Dysphagia Providers:             Wyline Mood MD, MD Referring MD:          Smitty Cords (Referring MD) Medicines:             Monitored Anesthesia Care Complications:         No immediate complications. Procedure:             Pre-Anesthesia Assessment:                        - Prior to the procedure, a History and Physical was                         performed, and patient medications, allergies and                         sensitivities were reviewed. The patient's tolerance                         of previous anesthesia was reviewed.                        - The risks and benefits of the procedure and the                         sedation options and risks were discussed with the                         patient. All questions were answered and informed                         consent was obtained.                        - ASA Grade Assessment: II - A patient with mild                         systemic disease.                        After obtaining informed consent, the endoscope was                         passed under direct vision. Throughout the procedure,                         the patient's blood pressure, pulse, and oxygen                         saturations were monitored continuously. The Endoscope                         was introduced through  the mouth, and advanced to the                         third part of duodenum. The upper GI endoscopy was                         accomplished with ease. The patient tolerated the                         procedure well. Findings:      The stomach was  normal.      The examined duodenum was normal.      The cardia and gastric fundus were normal on retroflexion.      The examined esophagus was normal. Biopsies were taken with a cold       forceps for histology. Impression:            - Normal stomach.                        - Normal examined duodenum.                        - Normal esophagus. Biopsied. Recommendation:        - Await pathology results.                        - Perform a colonoscopy today. Procedure Code(s):     --- Professional ---                        [phone], Esophagogastroduodenoscopy, flexible,                         transoral; with biopsy, single or multiple Diagnosis Code(s):     --- Professional ---                        R13.10, Dysphagia, unspecified CPT copyright 2022 American Medical Association. All rights reserved. The codes documented in this report are preliminary and upon coder review may  be revised to meet current compliance requirements. Wyline Mood, MD Wyline Mood MD, MD 06/20/2023 8:37:47 AM This report has been signed electronically. Number of Addenda: 0 Note Initiated On: 06/20/2023 8:18 AM Estimated Blood Loss:  Estimated blood loss: none.      [facility]

## 2023-06-20 NOTE — Op Note (Signed)
[facility] Gastroenterology Patient Name: Ashley Hale Procedure Date: 06/20/2023 8:15 AM MRN: [id number] Account #: [id number] Date of Birth: [birth year] Admit Type: Outpatient Age: 62 Room: [facility] ENDO ROOM 1 Gender: Female Note Status: Finalized Instrument Name: Nelda Marseille [id number] Procedure:             Colonoscopy Indications:           Screening for colorectal malignant neoplasm Providers:             Wyline Mood MD, MD Referring MD:          Smitty Cords (Referring MD) Medicines:             Monitored Anesthesia Care Complications:         No immediate complications. Procedure:             Pre-Anesthesia Assessment:                        - Prior to the procedure, a History and Physical was                         performed, and patient medications, allergies and                         sensitivities were reviewed. The patient's tolerance                         of previous anesthesia was reviewed.                        - The risks and benefits of the procedure and the                         sedation options and risks were discussed with the                         patient. All questions were answered and informed                         consent was obtained.                        - ASA Grade Assessment: II - A patient with mild                         systemic disease.                        After obtaining informed consent, the colonoscope was                         passed under direct vision. Throughout the procedure,                         the patient's blood pressure, pulse, and oxygen                         saturations were monitored continuously. The                         Colonoscope was introduced  through the anus and                         advanced to the the cecum, identified by the                         appendiceal orifice. The colonoscopy was performed                         with ease. The patient tolerated the procedure  well.                         The quality of the bowel preparation was adequate. The                         ileocecal valve, appendiceal orifice, and rectum were                         photographed. Findings:      The entire examined colon appeared normal on direct and retroflexion       views. Impression:            - The entire examined colon is normal on direct and                         retroflexion views.                        - No specimens collected. Recommendation:        - Discharge patient to home (with escort).                        - Resume previous diet.                        - Continue present medications.                        - Repeat colonoscopy in 10 years for screening                         purposes. Procedure Code(s):     --- Professional ---                        [phone], Colonoscopy, flexible; diagnostic, including                         collection of specimen(s) by brushing or washing, when                         performed (separate procedure) Diagnosis Code(s):     --- Professional ---                        Z12.11, Encounter for screening for malignant neoplasm                         of colon CPT copyright 2022 American Medical Association. All rights reserved. The codes documented in this report are preliminary and upon coder review may  be revised to meet current  compliance requirements. Wyline Mood, MD Wyline Mood MD, MD 06/20/2023 8:57:57 AM This report has been signed electronically. Number of Addenda: 0 Note Initiated On: 06/20/2023 8:15 AM Scope Withdrawal Time: 0 hours 9 minutes 21 seconds  Total Procedure Duration: 0 hours 16 minutes 10 seconds  Estimated Blood Loss:  Estimated blood loss: none.      [facility]

## 2023-06-20 NOTE — Transfer of Care (Signed)
Immediate Anesthesia Transfer of Care Note  Patient: Ashley Hale  Procedure(s) Performed: COLONOSCOPY WITH PROPOFOL ESOPHAGOGASTRODUODENOSCOPY (EGD) WITH PROPOFOL  Patient Location: PACU and Endoscopy Unit  Anesthesia Type:General  Level of Consciousness: awake  Airway & Oxygen Therapy: Patient Spontanous Breathing  Post-op Assessment: Report given to RN and Post -op Vital signs reviewed and stable  Post vital signs: Reviewed and stable  Last Vitals:  Vitals Value Taken Time  BP 90/53 06/20/23 0900  Temp 36.2 C 06/20/23 0859  Pulse 85 06/20/23 0901  Resp 17 06/20/23 0901  SpO2 96 % 06/20/23 0901  Vitals shown include unfiled device data.  Last Pain:  Vitals:   06/20/23 0859  TempSrc: Tympanic  PainSc: Asleep         Complications: No notable events documented.

## 2023-06-20 NOTE — H&P (Signed)
Wyline Mood, MD [address], [address], [place], [place], [address] [address], [address], [place], [place], [address] Phone: [phone]  Fax: [phone]  Primary Care Physician:  Smitty Cords, DO   Pre-Procedure History & Physical: HPI:  Ashley Hale is a 62 y.o. female is here for an endoscopy and colonoscopy    Past Medical History:  Diagnosis Date   GERD (gastroesophageal reflux disease)    IBS (irritable bowel syndrome)    Obesity     Past Surgical History:  Procedure Laterality Date   ABDOMINAL HYSTERECTOMY     AMPUTATION TOE Right 10/15/2022   Procedure: AMPUTATION TOE, RIGHT THIRD TOE;  Surgeon: Edwin Cap, DPM;  Location: [facility];  Service: Podiatry;  Laterality: Right;   BACK SURGERY  1992   Lumbar spine, herniated disc    Prior to Admission medications   Medication Sig Start Date End Date Taking? Authorizing Provider  albuterol (VENTOLIN HFA) 108 (90 Base) MCG/ACT inhaler Inhale into the lungs every 6 (six) hours as needed for wheezing or shortness of breath.    [provider]  BLACK COHOSH EXTRACT PO Take 1 capsule by mouth daily.    [provider]  buPROPion (WELLBUTRIN XL) 150 MG 24 hr tablet TAKE 1 TABLET BY MOUTH ONCE DAILY 12/07/22   Althea Charon, Netta Neat, DO  cyclobenzaprine (FLEXERIL) 10 MG tablet Take 0.5-1 tablets (5-10 mg total) by mouth 3 (three) times daily as needed for muscle spasms. Recommend start at night only Patient not taking: Reported on 04/18/2023 11/28/17   Smitty Cords, DO  fluticasone [facility]) 50 MCG/ACT nasal spray Place 2 sprays into both nostrils daily. Use for 4-6 weeks then stop and use seasonally or as needed. Patient taking differently: Place 2 sprays into both nostrils daily as needed for rhinitis or allergies. 07/27/21   Karamalegos, Netta Neat, DO  furosemide (LASIX) 40 MG tablet Take by mouth. Patient not taking: Reported on 04/18/2023 06/29/13   [provider]   liraglutide (VICTOZA) 18 MG/3ML SOPN Inject 0.6 mg into the skin daily. Patient not taking: Reported on 06/13/2023    [provider]  metFORMIN (GLUCOPHAGE) 500 MG tablet Take by mouth 2 (two) times daily with a meal. Patient not taking: Reported on 06/13/2023    [provider]  Multiple Vitamins-Minerals (HAIR/SKIN/NAILS) CAPS Take 1 capsule by mouth daily.    [provider]  omeprazole (PRILOSEC) 20 MG capsule Take 20 mg by mouth daily.    [provider]  OZEMPIC, 2 MG/DOSE, 8 MG/3ML SOPN INJECT 2MG  SUBCUTANEOUSLY ONCE A WEEK 04/07/23   Karamalegos, Netta Neat, DO  PARoxetine (PAXIL) 20 MG tablet Take 1 tablet (20 mg total) by mouth daily. 12/27/22   Karamalegos, Netta Neat, DO  rOPINIRole (REQUIP) 1 MG tablet Take 1-2 tablets (1-2 mg total) by mouth at bedtime. 12/27/22   Karamalegos, Netta Neat, DO  Sodium Sulfate-Mag Sulfate-KCl (SUTAB) [phone] MG TABS At 5 PM take 12 tablets using the 8 oz cup provided in the kit drinking 5 cups of water and 5 hours before your procedure repeat the same process. 04/18/23   Wyline Mood, MD    Allergies as of 05/12/2023 - Review Complete 05/11/2023  Allergen Reaction Noted   Other Other (See Comments) 10/14/2022   Shellfish allergy Nausea And Vomiting 10/13/2016   Sulfa antibiotics Hives and Other (See Comments) 11/04/2014    Family History  Problem Relation Age of Onset   Heart disease Mother  Hypertension Mother    Hyperlipidemia Mother    Heart disease Father    Glaucoma Father    Breast cancer Neg Hx     Social History   Socioeconomic History   Marital status: Married    Spouse name: Not on file   Number of children: Not on file   Years of education: Not on file   Highest education level: Not on file  Occupational History   Not on file  Tobacco Use   Smoking status: Never   Smokeless tobacco: Never  Vaping Use   Vaping status: Never Used  Substance and Sexual Activity   Alcohol use:  No    Alcohol/week: 0.0 standard drinks of alcohol   Drug use: No   Sexual activity: Not on file  Other Topics Concern   Not on file  Social History Narrative   Not on file   Social Determinants of Health   Financial Resource Strain: Not on file  Food Insecurity: No Food Insecurity (10/14/2022)   Hunger Vital Sign    Worried About Running Out of Food in the Last Year: Never true    Ran Out of Food in the Last Year: Never true  Transportation Needs: No Transportation Needs (10/14/2022)   PRAPARE - Administrator, Civil Service (Medical): No    Lack of Transportation (Non-Medical): No  Physical Activity: Not on file  Stress: Not on file  Social Connections: Not on file  Intimate Partner Violence: Not At Risk (10/14/2022)   Humiliation, Afraid, Rape, and Kick questionnaire    Fear of Current or Ex-Partner: No    Emotionally Abused: No    Physically Abused: No    Sexually Abused: No    Review of Systems: See HPI, otherwise negative ROS  Physical Exam: There were no vitals taken for this visit. General:   Alert,  pleasant and cooperative in NAD Head:  Normocephalic and atraumatic. Neck:  Supple; no masses or thyromegaly. Lungs:  Clear throughout to auscultation, normal respiratory effort.    Heart:  +S1, +S2, Regular rate and rhythm, No edema. Abdomen:  Soft, nontender and nondistended. Normal bowel sounds, without guarding, and without rebound.   Neurologic:  Alert and  oriented x4;  grossly normal neurologically.  Impression/Plan: Ashley Hale is here for an endoscopy and colonoscopy  to be performed for  evaluation of dysphagia and colon cancer screening     Risks, benefits, limitations, and alternatives regarding endoscopy have been reviewed with the patient.  Questions have been answered.  All parties agreeable.   Wyline Mood, MD  06/20/2023, 8:05 AM

## 2023-06-20 NOTE — Anesthesia Procedure Notes (Signed)
Procedure Name: MAC Date/Time: 06/20/2023 8:34 AM  Performed by: Cheral Bay, CRNAPre-anesthesia Checklist: Patient identified, Emergency Drugs available, Suction available, Patient being monitored and Timeout performed Patient Re-evaluated:Patient Re-evaluated prior to induction Oxygen Delivery Method: Nasal cannula Induction Type: IV induction Placement Confirmation: positive ETCO2 and CO2 detector

## 2023-06-20 NOTE — Anesthesia Preprocedure Evaluation (Signed)
Anesthesia Evaluation  Patient identified by MRN, date of birth, ID band Patient awake    Reviewed: Allergy & Precautions, NPO status , Patient's Chart, lab work & pertinent test results  History of Anesthesia Complications Negative for: history of anesthetic complications  Airway Mallampati: III  TM Distance: <3 FB Neck ROM: full    Dental  (+) Chipped   Pulmonary neg shortness of breath, sleep apnea    Pulmonary exam normal        Cardiovascular Exercise Tolerance: Good (-) angina negative cardio ROS Normal cardiovascular exam     Neuro/Psych  Neuromuscular disease  negative psych ROS   GI/Hepatic Neg liver ROS,GERD  Controlled,,  Endo/Other  diabetes, Type 2    Renal/GU negative Renal ROS  negative genitourinary   Musculoskeletal   Abdominal   Peds  Hematology negative hematology ROS (+)   Anesthesia Other Findings Patient reports that they do not think that any food or pills are stuck in their throat at this time.  Past Medical History: No date: Anxiety No date: Depression No date: GERD (gastroesophageal reflux disease) No date: IBS (irritable bowel syndrome) No date: Obesity No date: Sleep apnea  Past Surgical History: No date: ABDOMINAL HYSTERECTOMY 10/15/2022: AMPUTATION TOE; Right     Comment:  Procedure: AMPUTATION TOE, RIGHT THIRD TOE;  Surgeon:               Edwin Cap, DPM;  Location: [facility];  Service:               Podiatry;  Laterality: Right; 1992: BACK SURGERY     Comment:  Lumbar spine, herniated disc  BMI    Body Mass Index: 28.35 kg/m      Reproductive/Obstetrics negative OB ROS                             Anesthesia Physical Anesthesia Plan  ASA: 3  Anesthesia Plan: General   Post-op Pain Management:    Induction: Intravenous  PONV Risk Score and Plan: Propofol infusion and TIVA  Airway Management Planned: Natural Airway and Nasal  Cannula  Additional Equipment:   Intra-op Plan:   Post-operative Plan:   Informed Consent: I have reviewed the patients History and Physical, chart, labs and discussed the procedure including the risks, benefits and alternatives for the proposed anesthesia with the patient or authorized representative who has indicated his/her understanding and acceptance.     Dental Advisory Given  Plan Discussed with: Anesthesiologist, CRNA and Surgeon  Anesthesia Plan Comments: (Patient consented for risks of anesthesia including but not limited to:  - adverse reactions to medications - risk of airway placement if required - damage to eyes, teeth, lips or other oral mucosa - nerve damage due to positioning  - sore throat or hoarseness - Damage to heart, brain, nerves, lungs, other parts of body or loss of life  Patient voiced understanding and assent.)       Anesthesia Quick Evaluation

## 2023-06-21 ENCOUNTER — Encounter: Payer: Self-pay | Admitting: Gastroenterology

## 2023-06-21 LAB — SURGICAL PATHOLOGY

## 2023-06-22 ENCOUNTER — Encounter: Payer: Self-pay | Admitting: Gastroenterology

## 2023-07-04 ENCOUNTER — Ambulatory Visit: Payer: BC Managed Care – PPO | Admitting: Family Medicine

## 2023-07-04 ENCOUNTER — Encounter: Payer: Self-pay | Admitting: Family Medicine

## 2023-07-04 ENCOUNTER — Other Ambulatory Visit: Payer: Self-pay | Admitting: Family Medicine

## 2023-07-04 VITALS — BP 132/78 | HR 86 | Ht 69.0 in | Wt 193.0 lb

## 2023-07-04 DIAGNOSIS — E1169 Type 2 diabetes mellitus with other specified complication: Secondary | ICD-10-CM

## 2023-07-04 DIAGNOSIS — G4733 Obstructive sleep apnea (adult) (pediatric): Secondary | ICD-10-CM

## 2023-07-04 DIAGNOSIS — F4321 Adjustment disorder with depressed mood: Secondary | ICD-10-CM

## 2023-07-04 DIAGNOSIS — Z23 Encounter for immunization: Secondary | ICD-10-CM | POA: Diagnosis not present

## 2023-07-04 DIAGNOSIS — Z0000 Encounter for general adult medical examination without abnormal findings: Secondary | ICD-10-CM

## 2023-07-04 DIAGNOSIS — E785 Hyperlipidemia, unspecified: Secondary | ICD-10-CM

## 2023-07-04 LAB — POCT GLYCOSYLATED HEMOGLOBIN (HGB A1C): Hemoglobin A1C: 5.4 % (ref 4.0–5.6)

## 2023-07-04 MED ORDER — BUPROPION HCL ER (XL) 300 MG PO TB24: 300.0000 mg | ORAL_TABLET | Freq: Every day | ORAL | 1 refills | Status: DC

## 2023-07-04 NOTE — Patient Instructions (Addendum)
Thank you for coming to the office today.  Check the medicines, if need refills before 6 months, let us know. Or the pharmacy.  Dose increase Wellbutrin XL from 150 up to 300mg , new rx sent. Can take TWO of the old one if you prefer.  Recent Labs    12/20/22 0837 07/04/23 0909  HGBA1C 5.5 5.4   Excellent result.  DUE for FASTING BLOOD WORK (no food or drink after midnight before the lab appointment, only water or coffee without cream/sugar on the morning of)  SCHEDULE "Lab Only" visit in the morning at the clinic for lab draw in 6 MONTHS   - Make sure Lab Only appointment is at about 1 week before your next appointment, so that results will be available  For Lab Results, once available within 2-3 days of blood draw, you can can log in to MyChart online to view your results and a brief explanation. Also, we can discuss results at next follow-up visit.   Please schedule a Follow-up Appointment to: Return in about 6 months (around 01/02/2024) for 6 month fasting lab > 1 week later Annual Physical.  If you have any other questions or concerns, please feel free to call the office or send a message through MyChart. You may also schedule an earlier appointment if necessary.  Additionally, you may be receiving a survey about your experience at our office within a few days to 1 week by e-mail or mail. We value your feedback.  Saralyn Pilar, DO [facility], [place]

## 2023-07-04 NOTE — Progress Notes (Signed)
Subjective:    Patient ID: Ashley Hale, female    DOB: [birth year], 62 y.o.   MRN: [id number]  Ashley Hale is a 62 y.o. female presenting on 07/04/2023 for Diabetes   HPI  Discussed the use of AI scribe software for clinical note transcription with the patient, who gave verbal consent to proceed.   Allergic Rash The patient also reported a skin condition, which initially presented as what she thought were mosquito bites but were later identified as a stress-related skin condition. She is currently receiving monthly injections for this condition.   Type 2 Diabetes Overweight BMI >28 Last A1c 5.4 (06/2023) Dramatic weight loss over past 2 years on GLP1 therapy - Continues on medication Ozempic 2mg  weekly. Inj Off Metformin   OSA on CPAP Not always using CPAP but when uses it does help Working with [facility] - Dr Gabriel Cirri They have repeated sleep study after lost weight, still severe sleep apnea still   Adjustment disorder depressed mood Stressors with mood vs anxiety concerns, due to multiple stressors, including the recent death of a family member in her home. She is currently on Wellbutrin 150mg  daily for mood management, but expressed interest in increasing the dose to improve her mood.   RLS Taking Ropinirole, does need something in afternoon at times or stronger dose in PM On 0.5mg  dose now   R Foot Problem R lower extremity foot, missing middle toe, and hardware screw in 2nd / great toe R side. Reduced mobility. 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      Health Maintenance:   Mammogram last done 11/24/22   Colonoscopy completed after 2 day prep - 06/20/23, negative, repeat 10 years.      07/04/2023    9:42 AM 01/12/2023    2:29 PM 12/27/2022    9:49 AM  Depression screen PHQ 2/9  Decreased Interest 0 0 1  Down, Depressed, Hopeless 0 0 1  PHQ - 2 Score 0 0 2  Altered sleeping  0 2  Tired, decreased energy  0 1   Change in appetite  0 0  Feeling bad or failure about yourself   0 1  Trouble concentrating  0 1  Moving slowly or fidgety/restless  0 1  Suicidal thoughts  0 0  PHQ-9 Score  0 8  Difficult doing work/chores  Not difficult at all Not difficult at all       07/04/2023    9:42 AM 01/12/2023    2:29 PM 12/27/2022    9:50 AM 06/21/2022    8:13 AM  GAD 7 : Generalized Anxiety Score  Nervous, Anxious, on Edge 1 0 1 1  Control/stop worrying 1 0 0 1  Worry too much - different things 1 0 1 1  Trouble relaxing 1 0 1 1  Restless 2 0 1 1  Easily annoyed or irritable 1 0 1 1  Afraid - awful might happen 0 0 0 0  Total GAD 7 Score 7 0 5 6  Anxiety Difficulty  Not difficult at all Not difficult at all Not difficult at all    Social History   Tobacco Use   Smoking status: Never   Smokeless tobacco: Never  Vaping Use   Vaping status: Never Used  Substance Use Topics   Alcohol use: No    Alcohol/week: 0.0 standard drinks of alcohol   Drug use: No    Review of Systems Per HPI unless specifically indicated  above     Objective:    BP 132/78   Pulse 86   Ht 5\' 9"  (1.753 m)   Wt 193 lb (87.5 kg)   BMI 28.50 kg/m   Wt Readings from Last 3 Encounters:  07/04/23 193 lb (87.5 kg)  06/20/23 192 lb (87.1 kg)  04/18/23 191 lb 12.8 oz (87 kg)    Physical Exam Vitals and nursing note reviewed.  Constitutional:      General: She is not in acute distress.    Appearance: She is well-developed. She is not diaphoretic.     Comments: Well-appearing, comfortable, cooperative  HENT:     Head: Normocephalic and atraumatic.  Eyes:     General:        Right eye: No discharge.        Left eye: No discharge.     Conjunctiva/sclera: Conjunctivae normal.  Neck:     Thyroid: No thyromegaly.  Cardiovascular:     Rate and Rhythm: Normal rate and regular rhythm.     Heart sounds: Normal heart sounds. No murmur heard. Pulmonary:     Effort: Pulmonary effort is normal. No respiratory distress.      Breath sounds: Normal breath sounds. No wheezing or rales.  Musculoskeletal:        General: Normal range of motion.     Cervical back: Normal range of motion and neck supple.  Lymphadenopathy:     Cervical: No cervical adenopathy.  Skin:    General: Skin is warm and dry.     Findings: No erythema or rash.  Neurological:     Mental Status: She is alert and oriented to person, place, and time.  Psychiatric:        Behavior: Behavior normal.     Comments: Well groomed, good eye contact, normal speech and thoughts     Recent Labs    12/20/22 0837 07/04/23 0909  HGBA1C 5.5 5.4   Diabetic Foot Exam - Simple   Simple Foot Form Diabetic Foot exam was performed with the following findings: Yes 07/04/2023  9:06 AM  Visual Inspection See comments: Yes Sensation Testing See comments: Yes Pulse Check Posterior Tibialis and Dorsalis pulse intact bilaterally: Yes Comments Bilateral feet with mild reduced monofilament sensation plantar, intact dorsal. 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 And has s/p 1st 2nd toes R side with hardware, reduced range of motion.      Results for orders placed or performed in visit on 07/04/23  POCT glycosylated hemoglobin (Hb A1C)   Collection Time: 07/04/23  9:09 AM  Result Value Ref Range   Hemoglobin A1C 5.4 4.0 - 5.6 %      Assessment & Plan:   Problem List Items Addressed This Visit     Hyperlipidemia associated with type 2 diabetes mellitus (HCC)   Relevant Orders   CT CARDIAC SCORING (SELF PAY ONLY)   OSA on CPAP   Type 2 diabetes mellitus with other specified complication (HCC) - Primary   Relevant Orders   POCT glycosylated hemoglobin (Hb A1C) (Completed)   Urine Microalbumin w/creat. ratio   CT CARDIAC SCORING (SELF PAY ONLY)   Other Visit Diagnoses       Flu vaccine need       Relevant Orders   Flu vaccine trivalent PF, 6mos and older(Flulaval,Afluria,Fluarix,Fluzone) (Completed)     Adjustment disorder with  depressed mood       Relevant Medications   buPROPion (WELLBUTRIN XL) 300 MG 24 hr tablet  Diabetes Mellitus Type 2, controlled Well controlled with HbA1c of 5.4. Significant weight loss Currently on Ozempic 2mg  weekly. -Continue Ozempic 2mg  weekly. -Check HbA1c in 6 months. Urine microalbumin DM Foot  Depression Patient reports feeling overwhelmed and desires to feel better. Currently on Wellbutrin XL 150mg  daily. -Increase Wellbutrin XL to 300mg  daily. Patient can take two of the 150mg  tablets until the new prescription is filled. -Reevaluate mood and medication effectiveness in 6 months.  Foot Neuropathy Patient reports numbness and occasional aching in right foot, likely secondary to nerve damage from previous surgical interventions. -Continue monitoring symptoms.  General Health Maintenance -Completed colonoscopy on 06/20/2023 with no polyps found. Next colonoscopy due in 10 years. -Ordered coronary calcium score heart scan to check for blockages in the main arteries of the heart -Schedule follow-up appointment in 6 months.     Flu Shot today     Orders Placed This Encounter  Procedures   CT CARDIAC SCORING (SELF PAY ONLY)    Standing Status:   Future    Expiration Date:   07/03/2024    Preferred imaging location?:   [facility]   Flu vaccine trivalent PF, 6mos and older(Flulaval,Afluria,Fluarix,Fluzone)   Urine Microalbumin w/creat. ratio   POCT glycosylated hemoglobin (Hb A1C)    Meds ordered this encounter  Medications   buPROPion (WELLBUTRIN XL) 300 MG 24 hr tablet    Sig: Take 1 tablet (300 mg total) by mouth daily.    Dispense:  90 tablet    Refill:  1    Dose increase from 150 to 300mg     Follow up plan: Return in about 6 months (around 01/02/2024) for 6 month fasting lab > 1 week later Annual Physical.  Future labs ordered for 01/09/24   Saralyn Pilar, DO [facility] 07/04/2023,  8:58 AM

## 2023-07-05 LAB — MICROALBUMIN / CREATININE URINE RATIO
Creatinine, Urine: 94 mg/dL (ref 20–275)
Microalb, Ur: <0.2 mg/dL

## 2023-07-06 ENCOUNTER — Ambulatory Visit
Admission: RE | Admit: 2023-07-06 | Discharge: 2023-07-06 | Disposition: A | Discharge status: Home / Self Care | Payer: Self-pay | Source: Ambulatory Visit | Attending: Family Medicine | Admitting: Family Medicine

## 2023-07-06 DIAGNOSIS — E785 Hyperlipidemia, unspecified: Secondary | ICD-10-CM | POA: Insufficient documentation

## 2023-07-06 DIAGNOSIS — E1169 Type 2 diabetes mellitus with other specified complication: Secondary | ICD-10-CM | POA: Insufficient documentation

## 2023-07-17 NOTE — Progress Notes (Unsigned)
Celso Amy, PA-C [address]  [address]  [place], [place] [address]  Main: [phone]  Fax: [phone]   Primary Care Physician: Smitty Cords, DO  Primary Gastroenterologist:  Celso Amy, PA-C / Dr. Wyline Mood    CC: F/U Dysphagia & Colon Cancer Screening  HPI: Ashley Hale is a 62 y.o. female, estab. Pt. Of Dr. Tobi Bastos, returns for 3 month f/u of dysphagia.  06/20/2023 EGD by Dr. Tobi Bastos, to evaluate dysphagia: Normal.  Esophageal biopsies consistent with acid reflux.  Negative for EOE.  06/20/2023 screening colonoscopy: Normal, no polyps.  Adequate prep.  10-year repeat.  Patient states she continues to have episodes of solid food dysphagia 2 or 3 times per week for 1 year.  She feels like food such as meat and bread gets stuck in her chest.  Recent EGD was normal.  No stricture.  Biopsies negative for EOE.  She has not had barium swallow test.  She has history of frequent pneumonia episodes thought secondary to acid reflux.  Currently taking omeprazole 20 Mg once daily with fair control of GERD.  Still has occasional breakthrough episodes of reflux.  Has been on Ozempic for several years.  She reports 8 pound weight gain in the past 6 months.  Denies weight loss.  She also has constipation.  She gets full easily with abdominal bloating.  Constipation has been worse on Ozempic.  No current treatment for constipation.  Current Outpatient Medications  Medication Sig Dispense Refill   albuterol (VENTOLIN HFA) 108 (90 Base) MCG/ACT inhaler Inhale into the lungs every 6 (six) hours as needed for wheezing or shortness of breath.     BLACK COHOSH EXTRACT PO Take 1 capsule by mouth daily.     buPROPion (WELLBUTRIN XL) 300 MG 24 hr tablet Take 1 tablet (300 mg total) by mouth daily. 90 tablet 1   cyclobenzaprine (FLEXERIL) 10 MG tablet Take 0.5-1 tablets (5-10 mg total) by mouth 3 (three) times daily as needed for muscle spasms. Recommend start at night only 30 tablet 2    fluticasone (FLONASE) 50 MCG/ACT nasal spray Place 2 sprays into both nostrils daily. Use for 4-6 weeks then stop and use seasonally or as needed. (Patient taking differently: Place 2 sprays into both nostrils daily as needed for rhinitis or allergies.) 16 g 3   furosemide (LASIX) 40 MG tablet Take by mouth.     Multiple Vitamins-Minerals (HAIR/SKIN/NAILS) CAPS Take 1 capsule by mouth daily.     OZEMPIC, 2 MG/DOSE, 8 MG/3ML SOPN INJECT 2MG  SUBCUTANEOUSLY ONCE A WEEK 9 mL 1   PARoxetine (PAXIL) 20 MG tablet Take 1 tablet (20 mg total) by mouth daily. 90 tablet 3   polyethylene glycol powder (GLYCOLAX/MIRALAX) 17 GM/SCOOP powder Mix 1 capful in a Drink Once daily for Constipation.     rOPINIRole (REQUIP) 1 MG tablet Take 1-2 tablets (1-2 mg total) by mouth at bedtime. 180 tablet 3   omeprazole (PRILOSEC) 20 MG capsule Take 1 capsule (20 mg total) by mouth 2 (two) times daily before a meal. 60 capsule 5   No current facility-administered medications for this visit.    Allergies as of 07/18/2023 - Review Complete 07/18/2023  Allergen Reaction Noted   Other Other (See Comments) 10/14/2022   Shellfish allergy Nausea And Vomiting 10/13/2016   Sulfa antibiotics Hives and Other (See Comments) 11/04/2014    Past Medical History:  Diagnosis Date   Anxiety    Depression    GERD (gastroesophageal reflux disease)  IBS (irritable bowel syndrome)    Obesity    Sleep apnea     Past Surgical History:  Procedure Laterality Date   ABDOMINAL HYSTERECTOMY     AMPUTATION TOE Right 10/15/2022   Procedure: AMPUTATION TOE, RIGHT THIRD TOE;  Surgeon: Edwin Cap, DPM;  Location: [facility];  Service: Podiatry;  Laterality: Right;   BACK SURGERY  1992   Lumbar spine, herniated disc   COLONOSCOPY WITH PROPOFOL N/A 06/20/2023   Procedure: COLONOSCOPY WITH PROPOFOL;  Surgeon: Wyline Mood, MD;  Location: [facility] ENDOSCOPY;  Service: Gastroenterology;  Laterality: N/A;   ESOPHAGOGASTRODUODENOSCOPY (EGD) WITH  PROPOFOL N/A 06/20/2023   Procedure: ESOPHAGOGASTRODUODENOSCOPY (EGD) WITH PROPOFOL;  Surgeon: Wyline Mood, MD;  Location: [facility] ENDOSCOPY;  Service: Gastroenterology;  Laterality: N/A;    Review of Systems:    All systems reviewed and negative except where noted in HPI.   Physical Examination:   BP 118/74   Pulse 85   Temp 98.5 F (36.9 C)   Ht 5\' 9"  (1.753 m)   Wt 198 lb 6.4 oz (90 kg)   BMI 29.30 kg/m   General: Well-nourished, well-developed in no acute distress.  Neuro: Alert and oriented x 3.  Grossly intact.  Psych: Alert and cooperative, normal mood and affect.   Imaging Studies: CT CARDIAC SCORING (SELF PAY ONLY) Result Date: 07/07/2023 CLINICAL DATA:  Risk stratification EXAM: Coronary Calcium Score TECHNIQUE: The patient was scanned on a Siemens Somatom scanner. Axial non-contrast 3 mm slices were carried out through the heart. The data set was analyzed on a dedicated work station and scored using the Agatson method. FINDINGS: Non-cardiac: See separate report from [facility]. Ascending Aorta: Normal size Pericardium: Normal Coronary arteries: Normal origin of left and right coronary arteries. Distribution of arterial calcifications if present, as noted below; LM 0 LAD 0 LCx 0 RCA 0 Total 0 IMPRESSION AND RECOMMENDATION: 1. Coronary calcium score of 0. 2. CAC 0, CAC-DRS A0. 3. Continue heart healthy lifestyle and risk factor modification. Electronically Signed   By: Debbe Odea M.D.   On: 07/07/2023 08:43    Assessment and Plan:   Ashley Hale is a 62 y.o. y/o female returns for f/u of:  GERD  Recommend Lifestyle Modifications to prevent Acid Reflux.  Rec. Avoid coffee, sodas, peppermint, garlic, onions, alcohol, citrus fruits, chocolate, tomatoes, fatty and spicey foods.  Avoid eating 2-3 hours before bedtime.    Increase Prilosec to 20mg  BID.  Add OTC Pepcid or TUMS antacid prn.  Dysphagia  Barium Swallow with Tablet  Evaluate for esophageal  dysmotility or achalasia.  Recent EGD was unrevealing.  Biopsies negative for EOE.  No stricture.  Constipation Start Miralax 1 capful in a drink once daily.  Increase to 2 capfuls daily if needed. Recommend High Fiber diet with fruits, vegetables, and whole grains. Drink 64 ounces of Fluids Daily. If no improvement, consider Linzess, Amitiza or Trulance.     Celso Amy, PA-C  Follow up 4-6 weeks w/ TG.

## 2023-07-18 ENCOUNTER — Ambulatory Visit (INDEPENDENT_AMBULATORY_CARE_PROVIDER_SITE_OTHER): Payer: BC Managed Care – PPO | Admitting: Physician Assistant

## 2023-07-18 ENCOUNTER — Encounter: Payer: Self-pay | Admitting: Physician Assistant

## 2023-07-18 VITALS — BP 118/74 | HR 85 | Temp 98.5°F | Ht 69.0 in | Wt 198.4 lb

## 2023-07-18 DIAGNOSIS — R1310 Dysphagia, unspecified: Secondary | ICD-10-CM

## 2023-07-18 DIAGNOSIS — K5900 Constipation, unspecified: Secondary | ICD-10-CM

## 2023-07-18 DIAGNOSIS — K219 Gastro-esophageal reflux disease without esophagitis: Secondary | ICD-10-CM

## 2023-07-18 MED ORDER — OMEPRAZOLE 20 MG PO CPDR: 20.0000 mg | DELAYED_RELEASE_CAPSULE | Freq: Two times a day (BID) | ORAL | 5 refills | Status: DC

## 2023-07-18 MED ORDER — POLYETHYLENE GLYCOL 3350 17 GM/SCOOP PO POWD: ORAL | Status: AC

## 2023-07-18 NOTE — Patient Instructions (Addendum)
     Barium Swallow scheduled @ 07/25/23 @ 9:15 arrival-liquids only 3 hours prior.  For constipation: Start OTC Miralax Powder Mix 1 capful in 6 to 8 ounces of a drink once daily  Recommend high-fiber diet, 30 g of fiber daily Eat fruits, vegetables, and whole grains Drink 64 ounces of water / fluids daily.

## 2023-07-25 ENCOUNTER — Encounter: Payer: Self-pay | Admitting: Family Medicine

## 2023-07-25 ENCOUNTER — Other Ambulatory Visit: Payer: BC Managed Care – PPO

## 2023-07-25 DIAGNOSIS — I77810 Thoracic aortic ectasia: Secondary | ICD-10-CM | POA: Insufficient documentation

## 2023-08-23 ENCOUNTER — Ambulatory Visit: Payer: BC Managed Care – PPO | Admitting: Physician Assistant

## 2023-08-31 ENCOUNTER — Ambulatory Visit: Payer: 59 | Admitting: Physician Assistant

## 2023-09-19 ENCOUNTER — Encounter: Payer: Self-pay | Admitting: Gastroenterology

## 2023-10-18 DIAGNOSIS — L281 Prurigo nodularis: Secondary | ICD-10-CM | POA: Diagnosis not present

## 2023-11-24 ENCOUNTER — Encounter: Payer: Self-pay | Admitting: Internal Medicine

## 2023-11-24 ENCOUNTER — Ambulatory Visit (INDEPENDENT_AMBULATORY_CARE_PROVIDER_SITE_OTHER): Admitting: Internal Medicine

## 2023-11-24 VITALS — BP 124/70 | Ht 69.0 in | Wt 187.4 lb

## 2023-11-24 DIAGNOSIS — K5903 Drug induced constipation: Secondary | ICD-10-CM | POA: Diagnosis not present

## 2023-11-24 DIAGNOSIS — R3911 Hesitancy of micturition: Secondary | ICD-10-CM | POA: Diagnosis not present

## 2023-11-24 DIAGNOSIS — R109 Unspecified abdominal pain: Secondary | ICD-10-CM | POA: Diagnosis not present

## 2023-11-24 DIAGNOSIS — R1012 Left upper quadrant pain: Secondary | ICD-10-CM | POA: Diagnosis not present

## 2023-11-24 LAB — POCT URINE DIPSTICK
Bilirubin, UA: NEGATIVE
Glucose, UA: NEGATIVE mg/dL
Ketones, POC UA: NEGATIVE mg/dL
Nitrite, UA: NEGATIVE
POC PROTEIN,UA: NEGATIVE
Spec Grav, UA: 1.02 (ref 1.010–1.025)
Urobilinogen, UA: 0.2 U/dL
pH, UA: 6 (ref 5.0–8.0)

## 2023-11-24 MED ORDER — NITROFURANTOIN MONOHYD MACRO 100 MG PO CAPS: 100.0000 mg | ORAL_CAPSULE | Freq: Two times a day (BID) | ORAL | 0 refills | Status: DC

## 2023-11-24 NOTE — Progress Notes (Signed)
 Subjective:    Patient ID: Ashley Hale, female    DOB: February 05, 1961, 63 y.o.   MRN: [id number]  HPI  Discussed the use of AI scribe software for clinical note transcription with the patient, who gave verbal consent to proceed.   Ashley Hale "Milda Aline" is a 63 year old female with diabetes who presents with left flank pain.  She has been experiencing sharp, stabbing left flank pain for approximately two weeks, localized to the rib area, occurring intermittently without radiation or association with eating. She has not taken any medication for the pain, such as Tylenol  or ibuprofen, and the pain comes and goes without warning.  No nausea, vomiting, urinary, or vaginal symptoms, although she mentions a sensation of not urinating enough. There is no history of injury to the area. She has a history of diabetes and is currently on Ozempic . She denies any history of pancreatitis, heartburn, or reflux. She takes Prilosec daily and uses ibuprofen sparingly, about four times a week.  Her bowel movements have been less frequent, occurring every third day recently, whereas she previously had daily bowel movements. She has not had a bowel movement in the past couple of days. She denies any pain associated with the need to have a bowel movement.  She does not consume alcohol. She recalls a previous experience with kidney stones, which presented with different symptoms, including lower abdominal pain that was severe enough to require emergency care.       Review of Systems   Past Medical History:  Diagnosis Date   Anxiety    Depression    GERD (gastroesophageal reflux disease)    IBS (irritable bowel syndrome)    Obesity    Sleep apnea     Current Outpatient Medications  Medication Sig Dispense Refill   albuterol  (VENTOLIN  HFA) 108 (90 Base) MCG/ACT inhaler Inhale into the lungs every 6 (six) hours as needed for wheezing or shortness of breath.     BLACK COHOSH EXTRACT PO Take 1 capsule by  mouth daily.     buPROPion  (WELLBUTRIN  XL) 300 MG 24 hr tablet Take 1 tablet (300 mg total) by mouth daily. 90 tablet 1   cyclobenzaprine  (FLEXERIL ) 10 MG tablet Take 0.5-1 tablets (5-10 mg total) by mouth 3 (three) times daily as needed for muscle spasms. Recommend start at night only 30 tablet 2   fluticasone  (FLONASE ) 50 MCG/ACT nasal spray Place 2 sprays into both nostrils daily. Use for 4-6 weeks then stop and use seasonally or as needed. (Patient taking differently: Place 2 sprays into both nostrils daily as needed for rhinitis or allergies.) 16 g 3   furosemide  (LASIX ) 40 MG tablet Take by mouth.     Multiple Vitamins-Minerals (HAIR/SKIN/NAILS) CAPS Take 1 capsule by mouth daily.     omeprazole  (PRILOSEC) 20 MG capsule Take 1 capsule (20 mg total) by mouth 2 (two) times daily before a meal. 60 capsule 5   OZEMPIC , 2 MG/DOSE, 8 MG/3ML SOPN INJECT 2MG  SUBCUTANEOUSLY ONCE A WEEK 9 mL 1   PARoxetine  (PAXIL ) 20 MG tablet Take 1 tablet (20 mg total) by mouth daily. 90 tablet 3   polyethylene glycol powder (GLYCOLAX /MIRALAX ) 17 GM/SCOOP powder Mix 1 capful in a Drink Once daily for Constipation.     rOPINIRole  (REQUIP ) 1 MG tablet Take 1-2 tablets (1-2 mg total) by mouth at bedtime. 180 tablet 3   No current facility-administered medications for this visit.    Allergies  Allergen Reactions   Other Other (See Comments)  GAUZE used for dressing = Bleeding and severe "skin stinging"   Shellfish Allergy Nausea And Vomiting   Sulfa Antibiotics Hives and Other (See Comments)    STOMACH CRAMPS, also    Family History  Problem Relation Age of Onset   Heart disease Mother    Hypertension Mother    Hyperlipidemia Mother    Heart disease Father    Glaucoma Father    Breast cancer Neg Hx     Social History   Socioeconomic History   Marital status: Married    Spouse name: Not on file   Number of children: Not on file   Years of education: Not on file   Highest education level: Not on  file  Occupational History   Not on file  Tobacco Use   Smoking status: Never   Smokeless tobacco: Never  Vaping Use   Vaping status: Never Used  Substance and Sexual Activity   Alcohol use: No    Alcohol/week: 0.0 standard drinks of alcohol   Drug use: No   Sexual activity: Not on file  Other Topics Concern   Not on file  Social History Narrative   Not on file   Social Drivers of Health   Financial Resource Strain: Low Risk  (07/04/2023)   Overall Financial Resource Strain (CARDIA)    Difficulty of Paying Living Expenses: Not very hard  Food Insecurity: No Food Insecurity (07/04/2023)   Hunger Vital Sign    Worried About Running Out of Food in the Last Year: Never true    Ran Out of Food in the Last Year: Never true  Transportation Needs: No Transportation Needs (07/04/2023)   PRAPARE - Administrator, Civil Service (Medical): No    Lack of Transportation (Non-Medical): No  Physical Activity: Insufficiently Active (07/04/2023)   Exercise Vital Sign    Days of Exercise per Week: 7 days    Minutes of Exercise per Session: 10 min  Stress: No Stress Concern Present (07/04/2023)   [employer] of Occupational Health - Occupational Stress Questionnaire    Feeling of Stress : Only a little  Social Connections: Socially Integrated (07/04/2023)   Social Connection and Isolation Panel [NHANES]    Frequency of Communication with Friends and Family: More than three times a week    Frequency of Social Gatherings with Friends and Family: Twice a week    Attends Religious Services: More than 4 times per year    Active Member of [employer] or Organizations: Yes    Attends Engineer, structural: More than 4 times per year    Marital Status: Married  Catering manager Violence: Not At Risk (07/04/2023)   Humiliation, Afraid, Rape, and Kick questionnaire    Fear of Current or Ex-Partner: No    Emotionally Abused: No    Physically Abused: No    Sexually Abused: No      Constitutional: Denies fever, malaise, fatigue, headache or abrupt weight changes.  Respiratory: Denies difficulty breathing, shortness of breath, cough or sputum production.   Cardiovascular: Denies chest pain, chest tightness, palpitations or swelling in the hands or feet.  Gastrointestinal: Patient reports RUQ abdominal pain, constipation.  Denies bloating, diarrhea or blood in the stool.  GU: Patient reports urinary retention.  Denies urgency, frequency, pain with urination, burning sensation, blood in urine, odor or discharge. Musculoskeletal: Denies decrease in range of motion, difficulty with gait, muscle pain or joint pain and swelling.  Skin: Denies redness, rashes, lesions or ulcercations.  Neurological:  Denies dizziness, difficulty with memory, difficulty with speech or problems with balance and coordination.   No other specific complaints in a complete review of systems (except as listed in HPI above).      Objective:   Physical Exam  BP 124/70 (BP Location: Left Arm, Patient Position: Sitting, Cuff Size: Normal)   Ht 5\' 9"  (1.753 m)   Wt 187 lb 6.4 oz (85 kg)   BMI 27.67 kg/m   Wt Readings from Last 3 Encounters:  07/18/23 198 lb 6.4 oz (90 kg)  07/04/23 193 lb (87.5 kg)  06/20/23 192 lb (87.1 kg)    General: Appears her stated age, overweight, in NAD. Skin: Warm, dry and intact. No rashes noted. Cardiovascular: Normal rate and rhythm. S1,S2 noted.  No murmur, rubs or gallops noted.  Pulmonary/Chest: Normal effort and positive vesicular breath sounds. No respiratory distress. No wheezes, rales or ronchi noted.  Abdomen: Soft and mildly tender in the LUQ. Normal bowel sounds. No distention or masses noted. Liver, spleen and kidneys non palpable.  No CVA tenderness noted. Musculoskeletal: No pain with palpation of the left side of the ribs.  No difficulty with gait.  Neurological: Alert and oriented.    BMET    Component Value Date/Time   NA 137 01/07/2023  1558   K 3.5 01/07/2023 1558   CL 102 01/07/2023 1558   CO2 24 01/07/2023 1558   GLUCOSE 111 01/07/2023 1558   BUN 15 01/07/2023 1558   CREATININE 0.91 01/07/2023 1558   CALCIUM 8.1 (L) 01/07/2023 1558   GFRNONAA >60 10/14/2022 1324   GFRNONAA 59 (L) 11/03/2018 0837   GFRAA 68 11/03/2018 0837    Lipid Panel     Component Value Date/Time   CHOL 178 12/20/2022 0837   TRIG 85 12/20/2022 0837   HDL 65 12/20/2022 0837   CHOLHDL 2.7 12/20/2022 0837   VLDL 31 09/09/2015 0745   LDLCALC 95 12/20/2022 0837    CBC    Component Value Date/Time   WBC 8.2 01/07/2023 1558   RBC 3.63 (L) 01/07/2023 1558   HGB 10.7 (L) 01/07/2023 1558   HGB 11.7 09/15/2022 1057   HCT 31.5 (L) 01/07/2023 1558   HCT 35.7 09/15/2022 1057   PLT 248 01/07/2023 1558   PLT 281 09/15/2022 1057   MCV 86.8 01/07/2023 1558   MCV 87 09/15/2022 1057   MCH 29.5 01/07/2023 1558   MCHC 34.0 01/07/2023 1558   RDW 12.5 01/07/2023 1558   RDW 12.6 09/15/2022 1057   LYMPHSABS 1,512 12/20/2022 0837   LYMPHSABS 1.9 09/15/2022 1057   MONOABS 1.0 (H) 07/25/2017 1713   EOSABS 171 12/20/2022 0837   EOSABS 0.2 09/15/2022 1057   BASOSABS 41 12/20/2022 0837   BASOSABS 0.0 09/15/2022 1057    Hgb A1C Lab Results  Component Value Date   HGBA1C 5.4 07/04/2023            Assessment & Plan:   Assessment and Plan    LUQ abdominal pain, constipation Intermittent sharp left flank pain for two weeks. Differential includes musculoskeletal pain, pancreatic inflammation, UTI, or other abdominal pathology. No evidence of kidney stones or muscle strain. - Order labs for CBC, liver and kidney function, lipase levels. - Consider CT abdomen if labs inconclusive and symptoms persist. - Advise MiraLAX  to assess pain relief with bowel movement.  Possible urinary tract infection Leukocytes and blood in urine suggest UTI. No dysuria or frequency. Antibiotics started pending culture results. - Prescribe Macrobid  100 mg BID for  5  days. - Review urine culture results when available.  Follow-up with your PCP as previously scheduled Helayne Lo, NP

## 2023-11-24 NOTE — Patient Instructions (Signed)
 Flank Pain, Adult  Flank pain is pain in your side. The flank is the area on your side between your upper belly (abdomen) and your spine. The pain may occur over a short time (acute), or it may be long-term or come back often (chronic). It may be mild or very bad. Pain in this area can be caused by many different things.  Follow these instructions at home:    Drink enough fluid to keep your pee (urine) pale yellow.  Rest as told by your doctor.  Take over-the-counter and prescription medicines only as told by your doctor.  Keep a journal to keep track of:  What has caused your flank pain.  What has made your flank pain feel better.  Keep all follow-up visits.  Contact a doctor if:  Medicine does not help your pain.  You have new symptoms.  Your pain gets worse.  Your symptoms last longer than 2-3 days.  You have trouble peeing.  You are peeing more often than normal.  Get help right away if:  You have trouble breathing.  You are short of breath.  Your belly hurts, or it is swollen or red.  You feel like you may vomit (nauseous).  You vomit.  You feel faint, or you faint.  You have blood in your pee.  You have flank pain and a fever.  These symptoms may be an emergency. Get help right away. Call your local emergency services (911 in the [place]  Do not wait to see if the symptoms will go away.  Do not drive yourself to the hospital.  Summary  Flank pain is pain in your side. The flank is the area of your side between your upper belly (abdomen) and your spine.  Flank pain may occur over a short time (acute), or it may be long-term or come back often (chronic). It may be mild or very bad.  Pain in this area can be caused by many different things.  Contact your doctor if your symptoms get worse or last longer than 2-3 days.  This information is not intended to replace advice given to you by your health care provider. Make sure you discuss any questions you have with your health care provider.  Document Revised:  09/15/2020 Document Reviewed: 09/15/2020  Elsevier Patient Education  2024 [employer].

## 2023-11-25 ENCOUNTER — Telehealth: Payer: Self-pay

## 2023-11-25 ENCOUNTER — Ambulatory Visit: Admitting: Internal Medicine

## 2023-11-25 ENCOUNTER — Encounter: Payer: Self-pay | Admitting: Internal Medicine

## 2023-11-25 LAB — COMPREHENSIVE METABOLIC PANEL WITH GFR
AG Ratio: 1.9 (calc) (ref 1.0–2.5)
ALT: 14 U/L (ref 6–29)
AST: 15 U/L (ref 10–35)
Albumin: 4 g/dL (ref 3.6–5.1)
Alkaline phosphatase (APISO): 81 U/L (ref 37–153)
BUN/Creatinine Ratio: 13 (calc) (ref 6–22)
BUN: 15 mg/dL (ref 7–25)
CO2: 32 mmol/L (ref 20–32)
Calcium: 9 mg/dL (ref 8.6–10.4)
Chloride: 104 mmol/L (ref 98–110)
Creat: 1.12 mg/dL — ABNORMAL HIGH (ref 0.50–1.05)
Globulin: 2.1 g/dL (ref 1.9–3.7)
Glucose, Bld: 99 mg/dL (ref 65–139)
Potassium: 3.9 mmol/L (ref 3.5–5.3)
Sodium: 140 mmol/L (ref 135–146)
Total Bilirubin: 0.4 mg/dL (ref 0.2–1.2)
Total Protein: 6.1 g/dL (ref 6.1–8.1)
eGFR: 55 mL/min/{1.73_m2} — ABNORMAL LOW (ref >=60)

## 2023-11-25 LAB — URINE CULTURE
MICRO NUMBER:: 16432215
SPECIMEN QUALITY:: ADEQUATE

## 2023-11-25 LAB — CBC
HCT: 35.8 % (ref 35.0–45.0)
Hemoglobin: 12 g/dL (ref 11.7–15.5)
MCH: 29.5 pg (ref 27.0–33.0)
MCHC: 33.5 g/dL (ref 32.0–36.0)
MCV: 88 fL (ref 80.0–100.0)
MPV: 9.4 fL (ref 7.5–12.5)
Platelets: 224 10*3/uL (ref 140–400)
RBC: 4.07 10*6/uL (ref 3.80–5.10)
RDW: 12.7 % (ref 11.0–15.0)
WBC: 5 10*3/uL (ref 3.8–10.8)

## 2023-11-25 LAB — LIPASE: Lipase: 43 U/L (ref 7–60)

## 2023-11-25 NOTE — Telephone Encounter (Signed)
 Copied from CRM [id number]. Topic: Appointments - Appointment Scheduling >> Nov 25, 2023 11:59 AM [name] wrote: Pt called back and I advised her of Ashley Hale message. I asked about the ct and she asked that we hold off on the ct scan to see if this works.

## 2023-11-25 NOTE — Telephone Encounter (Signed)
 Copied from CRM [id number]. Topic: Appointments - Appointment Scheduling >> Nov 25, 2023  3:50 PM Lizabeth Riggs wrote: Kahmari would like for [facility] to call her back about her lab results. She would not tell me the questions she has but just wanted to talk with [facility]. I tried to call the clinic and no one answered.

## 2023-11-26 ENCOUNTER — Encounter: Payer: Self-pay | Admitting: Internal Medicine

## 2023-11-29 ENCOUNTER — Ambulatory Visit: Payer: Self-pay

## 2024-01-09 ENCOUNTER — Other Ambulatory Visit: Payer: Self-pay

## 2024-01-09 DIAGNOSIS — E785 Hyperlipidemia, unspecified: Secondary | ICD-10-CM | POA: Diagnosis not present

## 2024-01-09 DIAGNOSIS — Z0000 Encounter for general adult medical examination without abnormal findings: Secondary | ICD-10-CM

## 2024-01-09 DIAGNOSIS — E1169 Type 2 diabetes mellitus with other specified complication: Secondary | ICD-10-CM

## 2024-01-10 LAB — CBC WITH DIFFERENTIAL/PLATELET
Absolute Lymphocytes: 1411 {cells}/uL (ref 850–3900)
Absolute Monocytes: 324 {cells}/uL (ref 200–950)
Basophils Absolute: 29 {cells}/uL (ref 0–200)
Basophils Relative: 0.8 %
Eosinophils Absolute: 223 {cells}/uL (ref 15–500)
Eosinophils Relative: 6.2 %
HCT: 38 % (ref 35.0–45.0)
Hemoglobin: 12.5 g/dL (ref 11.7–15.5)
MCH: 30 pg (ref 27.0–33.0)
MCHC: 32.9 g/dL (ref 32.0–36.0)
MCV: 91.3 fL (ref 80.0–100.0)
MPV: 9.1 fL (ref 7.5–12.5)
Monocytes Relative: 9 %
Neutro Abs: 1613 {cells}/uL (ref 1500–7800)
Neutrophils Relative %: 44.8 %
Platelets: 208 10*3/uL (ref 140–400)
RBC: 4.16 10*6/uL (ref 3.80–5.10)
RDW: 12.3 % (ref 11.0–15.0)
Total Lymphocyte: 39.2 %
WBC: 3.6 10*3/uL — ABNORMAL LOW (ref 3.8–10.8)

## 2024-01-10 LAB — LIPID PANEL
Cholesterol: 204 mg/dL — ABNORMAL HIGH (ref <200)
HDL: 70 mg/dL (ref >=50)
LDL Cholesterol (Calc): 115 mg/dL — ABNORMAL HIGH
Non-HDL Cholesterol (Calc): 134 mg/dL — ABNORMAL HIGH (ref <130)
Total CHOL/HDL Ratio: 2.9 (calc) (ref <5.0)
Triglycerides: 91 mg/dL (ref <150)

## 2024-01-10 LAB — COMPLETE METABOLIC PANEL WITHOUT GFR
AG Ratio: 2.1 (calc) (ref 1.0–2.5)
ALT: 12 U/L (ref 6–29)
AST: 14 U/L (ref 10–35)
Albumin: 4.2 g/dL (ref 3.6–5.1)
Alkaline phosphatase (APISO): 72 U/L (ref 37–153)
BUN/Creatinine Ratio: 13 (calc) (ref 6–22)
BUN: 15 mg/dL (ref 7–25)
CO2: 31 mmol/L (ref 20–32)
Calcium: 9.2 mg/dL (ref 8.6–10.4)
Chloride: 103 mmol/L (ref 98–110)
Creat: 1.16 mg/dL — ABNORMAL HIGH (ref 0.50–1.05)
Globulin: 2 g/dL (ref 1.9–3.7)
Glucose, Bld: 94 mg/dL (ref 65–99)
Potassium: 4.7 mmol/L (ref 3.5–5.3)
Sodium: 140 mmol/L (ref 135–146)
Total Bilirubin: 0.3 mg/dL (ref 0.2–1.2)
Total Protein: 6.2 g/dL (ref 6.1–8.1)

## 2024-01-10 LAB — HEMOGLOBIN A1C
Hgb A1c MFr Bld: 5.3 % (ref <5.7)
Mean Plasma Glucose: 105 mg/dL
eAG (mmol/L): 5.8 mmol/L

## 2024-01-10 LAB — TSH: TSH: 1.39 m[IU]/L (ref 0.40–4.50)

## 2024-01-11 DIAGNOSIS — L281 Prurigo nodularis: Secondary | ICD-10-CM | POA: Diagnosis not present

## 2024-01-12 ENCOUNTER — Ambulatory Visit: Payer: Self-pay | Admitting: Family Medicine

## 2024-01-13 DIAGNOSIS — G4733 Obstructive sleep apnea (adult) (pediatric): Secondary | ICD-10-CM | POA: Diagnosis not present

## 2024-01-16 ENCOUNTER — Ambulatory Visit (INDEPENDENT_AMBULATORY_CARE_PROVIDER_SITE_OTHER): Payer: Self-pay | Admitting: Family Medicine

## 2024-01-16 VITALS — BP 98/68 | HR 79 | Ht 69.0 in | Wt 187.6 lb

## 2024-01-16 DIAGNOSIS — N951 Menopausal and female climacteric states: Secondary | ICD-10-CM

## 2024-01-16 DIAGNOSIS — Z780 Asymptomatic menopausal state: Secondary | ICD-10-CM

## 2024-01-16 DIAGNOSIS — Z7985 Long-term (current) use of injectable non-insulin antidiabetic drugs: Secondary | ICD-10-CM

## 2024-01-16 DIAGNOSIS — Z0000 Encounter for general adult medical examination without abnormal findings: Secondary | ICD-10-CM | POA: Diagnosis not present

## 2024-01-16 DIAGNOSIS — E1169 Type 2 diabetes mellitus with other specified complication: Secondary | ICD-10-CM

## 2024-01-16 DIAGNOSIS — G4733 Obstructive sleep apnea (adult) (pediatric): Secondary | ICD-10-CM

## 2024-01-16 DIAGNOSIS — G2581 Restless legs syndrome: Secondary | ICD-10-CM

## 2024-01-16 DIAGNOSIS — F4321 Adjustment disorder with depressed mood: Secondary | ICD-10-CM

## 2024-01-16 DIAGNOSIS — K219 Gastro-esophageal reflux disease without esophagitis: Secondary | ICD-10-CM

## 2024-01-16 DIAGNOSIS — E785 Hyperlipidemia, unspecified: Secondary | ICD-10-CM

## 2024-01-16 DIAGNOSIS — Z23 Encounter for immunization: Secondary | ICD-10-CM | POA: Diagnosis not present

## 2024-01-16 DIAGNOSIS — Z1231 Encounter for screening mammogram for malignant neoplasm of breast: Secondary | ICD-10-CM

## 2024-01-16 MED ORDER — OMEPRAZOLE 20 MG PO CPDR: 20.0000 mg | DELAYED_RELEASE_CAPSULE | Freq: Two times a day (BID) | ORAL | 3 refills | Status: AC

## 2024-01-16 MED ORDER — OZEMPIC (2 MG/DOSE) 8 MG/3ML ~~LOC~~ SOPN: PEN_INJECTOR | SUBCUTANEOUS | 5 refills | Status: DC

## 2024-01-16 MED ORDER — ROPINIROLE HCL 1 MG PO TABS: 1.0000 mg | ORAL_TABLET | Freq: Every day | ORAL | 3 refills | Status: AC

## 2024-01-16 MED ORDER — PAROXETINE HCL 20 MG PO TABS: 20.0000 mg | ORAL_TABLET | Freq: Every day | ORAL | 3 refills | Status: AC

## 2024-01-16 MED ORDER — BUPROPION HCL ER (XL) 300 MG PO TB24: 300.0000 mg | ORAL_TABLET | Freq: Every day | ORAL | 3 refills | Status: AC

## 2024-01-16 NOTE — Patient Instructions (Addendum)
 Thank you for coming to the office today.  Recent Labs    07/04/23 0909 01/09/24 0810  HGBA1C 5.4 5.3     For Mammogram screening for breast cancer and DEXA Scan (Bone mineral density) screening for osteoporosis  Call the Imaging Center below anytime to schedule your own appointment now that order has been placed.  [facility] at [facility] [address], [address] [place], [place] [address] Phone: [phone]  ------------------------  Prevnar-20 vaccine today, for pneumonia  Essential Tremors Usually no major treatment   Please schedule a Follow-up Appointment to: Return for 6 month DM A1c.  If you have any other questions or concerns, please feel free to call the office or send a message through MyChart. You may also schedule an earlier appointment if necessary.  Additionally, you may be receiving a survey about your experience at our office within a few days to 1 week by e-mail or mail. We value your feedback.  Marsa Officer, DO [facility], [place]

## 2024-01-16 NOTE — Progress Notes (Signed)
 Subjective:    Patient ID: Ashley Hale, female    DOB: [birth year], 63 y.o.   MRN: [id number]  Ashley Hale is a 63 y.o. female presenting on 01/16/2024 for Annual Exam   HPI  Discussed the use of AI scribe software for clinical note transcription with the patient, who gave verbal consent to proceed.  History of Present Illness   Petrita Blunck is a 63 year old female who presents for an annual physical exam.  Lipid profile and cardiovascular risk assessment - LDL cholesterol 115 mg/dL; previous values ranged from 95 to 160 mg/dL - Normal coronary artery calcium score six months ago  Medication management - Takes omeprazole  twice daily; previously managed by gastroenterology, now prefers management by current provider - Takes bupropion , Ozempic , paroxetine , and ropinirole  - Uses ropinirole  for restless legs; typically one tablet, increases to two tablets on Sundays or when more sedentary  Essential Tremor - Tremor most noticeable during fine motor activities, such as holding a fork - Described as an 'essential tremor'; not present at rest - Bothersome during meals when noticed by others - Does not significantly impact daily activities      Type 2 Diabetes Overweight BMI >27 Last A1c 5.3 (12/2023) Dramatic weight loss over past 2 years on GLP1 therapy - Continues on medication Ozempic  2mg  weekly. Inj Off Metformin    OSA on CPAP Not always using CPAP but when uses it does help Working with [facility] - Dr Ivery They have repeated sleep study after lost weight, still severe sleep apnea still   Adjustment disorder depressed mood Improved on Wellbutrin  XL 300mg  daily   RLS Taking Ropinirole , does need something in afternoon at times or stronger dose in PM taking 1-2 with good results     Health Maintenance:   Mammogram last done 11/24/22   Colonoscopy completed after 2 day prep - 06/20/23, negative, repeat 10 years.  Prevnar-20 today  Updated Shingrix      01/16/2024    8:47 AM 11/24/2023    3:26 PM 07/04/2023    9:42 AM  Depression screen PHQ 2/9  Decreased Interest 1 0 0  Down, Depressed, Hopeless 1 0 0  PHQ - 2 Score 2 0 0  Altered sleeping 2 1   Tired, decreased energy 1 1   Change in appetite 0 0   Feeling bad or failure about yourself  1 0   Trouble concentrating 1 1   Moving slowly or fidgety/restless 1 0   Suicidal thoughts 0 0   PHQ-9 Score 8 3   Difficult doing work/chores Not difficult at all Not difficult at all        01/16/2024    8:47 AM 11/24/2023    3:26 PM 07/04/2023    9:42 AM 01/12/2023    2:29 PM  GAD 7 : Generalized Anxiety Score  Nervous, Anxious, on Edge 1 1 1  0  Control/stop worrying 1 1 1  0  Worry too much - different things 1 1 1  0  Trouble relaxing 1 1 1  0  Restless 1 1 2  0  Easily annoyed or irritable 1 1 1  0  Afraid - awful might happen 0 0 0 0  Total GAD 7 Score 6 6 7  0  Anxiety Difficulty Not difficult at all Not difficult at all  Not difficult at all     Past Medical History:  Diagnosis Date   Anxiety    Depression    GERD (gastroesophageal reflux disease)    IBS (  irritable bowel syndrome)    Obesity    Sleep apnea    Past Surgical History:  Procedure Laterality Date   ABDOMINAL HYSTERECTOMY     AMPUTATION TOE Right 10/15/2022   Procedure: AMPUTATION TOE, RIGHT THIRD TOE;  Surgeon: Silva Juliene SAUNDERS, DPM;  Location: [facility];  Service: Podiatry;  Laterality: Right;   BACK SURGERY  1992   Lumbar spine, herniated disc   COLONOSCOPY WITH PROPOFOL  N/A 06/20/2023   Procedure: COLONOSCOPY WITH PROPOFOL ;  Surgeon: Therisa Bi, MD;  Location: [facility] ENDOSCOPY;  Service: Gastroenterology;  Laterality: N/A;   ESOPHAGOGASTRODUODENOSCOPY (EGD) WITH PROPOFOL  N/A 06/20/2023   Procedure: ESOPHAGOGASTRODUODENOSCOPY (EGD) WITH PROPOFOL ;  Surgeon: Therisa Bi, MD;  Location: [facility];  Service: Gastroenterology;  Laterality: N/A;   Social History   Socioeconomic History   Marital status: Married     Spouse name: Not on file   Number of children: Not on file   Years of education: Not on file   Highest education level: Associate degree: academic program  Occupational History   Not on file  Tobacco Use   Smoking status: Never   Smokeless tobacco: Never  Vaping Use   Vaping status: Never Used  Substance and Sexual Activity   Alcohol use: No    Alcohol/week: 0.0 standard drinks of alcohol   Drug use: No   Sexual activity: Not on file  Other Topics Concern   Not on file  Social History Narrative   Not on file   Social Drivers of Health   Financial Resource Strain: Low Risk  (01/16/2024)   Overall Financial Resource Strain (CARDIA)    Difficulty of Paying Living Expenses: Not very hard  Food Insecurity: No Food Insecurity (01/16/2024)   Hunger Vital Sign    Worried About Running Out of Food in the Last Year: Never true    Ran Out of Food in the Last Year: Never true  Transportation Needs: No Transportation Needs (01/16/2024)   PRAPARE - Administrator, Civil Service (Medical): No    Lack of Transportation (Non-Medical): No  Physical Activity: Insufficiently Active (07/04/2023)   Exercise Vital Sign    Days of Exercise per Week: 7 days    Minutes of Exercise per Session: 10 min  Stress: Stress Concern Present (01/16/2024)   [employer] of Occupational Health - Occupational Stress Questionnaire    Feeling of Stress: Very much  Social Connections: Moderately Integrated (01/16/2024)   Social Connection and Isolation Panel    Frequency of Communication with Friends and Family: Once a week    Frequency of Social Gatherings with Friends and Family: Once a week    Attends Religious Services: More than 4 times per year    Active Member of [employer] or Organizations: Yes    Attends Engineer, structural: More than 4 times per year    Marital Status: Married  Catering manager Violence: Not At Risk (07/04/2023)   Humiliation, Afraid, Rape, and Kick questionnaire     Fear of Current or Ex-Partner: No    Emotionally Abused: No    Physically Abused: No    Sexually Abused: No   Family History  Problem Relation Age of Onset   Heart disease Mother    Hypertension Mother    Hyperlipidemia Mother    Heart disease Father    Glaucoma Father    Breast cancer Neg Hx    Current Outpatient Medications on File Prior to Visit  Medication Sig   albuterol  (VENTOLIN  HFA) 108 (  90 Base) MCG/ACT inhaler Inhale into the lungs every 6 (six) hours as needed for wheezing or shortness of breath.   BLACK COHOSH EXTRACT PO Take 1 capsule by mouth daily.   clobetasol ointment (TEMOVATE) 0.05 % Apply 1 Application topically 2 (two) times daily.   cyclobenzaprine  (FLEXERIL ) 10 MG tablet Take 0.5-1 tablets (5-10 mg total) by mouth 3 (three) times daily as needed for muscle spasms. Recommend start at night only   Dietary Management Product (VASCULERA PO) Take by mouth.   furosemide  (LASIX ) 40 MG tablet Take by mouth.   hydrOXYzine  (ATARAX ) 25 MG tablet Take 25 mg by mouth daily.   mometasone (ELOCON) 0.1 % ointment Apply topically.   Multiple Vitamins-Minerals (HAIR/SKIN/NAILS) CAPS Take 1 capsule by mouth daily.   polyethylene glycol powder (GLYCOLAX /MIRALAX ) 17 GM/SCOOP powder Mix 1 capful in a Drink Once daily for Constipation.   fluticasone  (FLONASE ) 50 MCG/ACT nasal spray Place 2 sprays into both nostrils daily. Use for 4-6 weeks then stop and use seasonally or as needed. (Patient not taking: Reported on 01/16/2024)   No current facility-administered medications on file prior to visit.    Review of Systems  Constitutional:  Negative for activity change, appetite change, chills, diaphoresis, fatigue and fever.  HENT:  Negative for congestion and hearing loss.   Eyes:  Negative for visual disturbance.  Respiratory:  Negative for cough, chest tightness, shortness of breath and wheezing.   Cardiovascular:  Negative for chest pain, palpitations and leg swelling.   Gastrointestinal:  Negative for abdominal pain, constipation, diarrhea, nausea and vomiting.  Genitourinary:  Negative for dysuria, frequency and hematuria.  Musculoskeletal:  Negative for arthralgias and neck pain.  Skin:  Negative for rash.  Neurological:  Negative for dizziness, weakness, light-headedness, numbness and headaches.  Hematological:  Negative for adenopathy.  Psychiatric/Behavioral:  Negative for behavioral problems, dysphoric mood and sleep disturbance.    Per HPI unless specifically indicated above     Objective:    BP 98/68 (BP Location: Left Arm, Patient Position: Sitting, Cuff Size: Normal)   Pulse 79   Ht 5' 9 (1.753 m)   Wt 187 lb 9.6 oz (85.1 kg)   LMP  (LMP Unknown)   SpO2 99%   Breastfeeding Unknown   BMI 27.70 kg/m   Wt Readings from Last 3 Encounters:  01/16/24 187 lb 9.6 oz (85.1 kg)  11/24/23 187 lb 6.4 oz (85 kg)  07/18/23 198 lb 6.4 oz (90 kg)    Physical Exam Vitals and nursing note reviewed.  Constitutional:      General: She is not in acute distress.    Appearance: She is well-developed. She is not diaphoretic.     Comments: Well-appearing, comfortable, cooperative  HENT:     Head: Normocephalic and atraumatic.   Eyes:     General:        Right eye: No discharge.        Left eye: No discharge.     Conjunctiva/sclera: Conjunctivae normal.     Pupils: Pupils are equal, round, and reactive to light.   Neck:     Thyroid : No thyromegaly.     Vascular: No carotid bruit.   Cardiovascular:     Rate and Rhythm: Normal rate and regular rhythm.     Pulses: Normal pulses.     Heart sounds: Normal heart sounds. No murmur heard. Pulmonary:     Effort: Pulmonary effort is normal. No respiratory distress.     Breath sounds: Normal breath sounds. No wheezing  or rales.  Abdominal:     General: Bowel sounds are normal. There is no distension.     Palpations: Abdomen is soft. There is no mass.     Tenderness: There is no abdominal tenderness.    Musculoskeletal:        General: No tenderness. Normal range of motion.     Cervical back: Normal range of motion and neck supple.     Right lower leg: No edema.     Left lower leg: No edema.     Comments: Upper / Lower Extremities: - Normal muscle tone, strength bilateral upper extremities 5/5, lower extremities 5/5  Lymphadenopathy:     Cervical: No cervical adenopathy.   Skin:    General: Skin is warm and dry.     Findings: No erythema or rash.   Neurological:     Mental Status: She is alert and oriented to person, place, and time.     Comments: Distal sensation intact to light touch all extremities  Psychiatric:        Mood and Affect: Mood normal.        Behavior: Behavior normal.        Thought Content: Thought content normal.     Comments: Well groomed, good eye contact, normal speech and thoughts     Results for orders placed or performed in visit on 01/09/24  TSH   Collection Time: 01/09/24  8:10 AM  Result Value Ref Range   TSH 1.39 0.40 - 4.50 mIU/L  COMPLETE METABOLIC PANEL WITH GFR   Collection Time: 01/09/24  8:10 AM  Result Value Ref Range   Glucose, Bld 94 65 - 99 mg/dL   BUN 15 7 - 25 mg/dL   Creat 8.83 (H) 9.49 - 1.05 mg/dL   BUN/Creatinine Ratio 13 6 - 22 (calc)   Sodium 140 135 - 146 mmol/L   Potassium 4.7 3.5 - 5.3 mmol/L   Chloride 103 98 - 110 mmol/L   CO2 31 20 - 32 mmol/L   Calcium 9.2 8.6 - 10.4 mg/dL   Total Protein 6.2 6.1 - 8.1 g/dL   Albumin 4.2 3.6 - 5.1 g/dL   Globulin 2.0 1.9 - 3.7 g/dL (calc)   AG Ratio 2.1 1.0 - 2.5 (calc)   Total Bilirubin 0.3 0.2 - 1.2 mg/dL   Alkaline phosphatase (APISO) 72 37 - 153 U/L   AST 14 10 - 35 U/L   ALT 12 6 - 29 U/L  CBC with Differential/Platelet   Collection Time: 01/09/24  8:10 AM  Result Value Ref Range   WBC 3.6 (L) 3.8 - 10.8 Thousand/uL   RBC 4.16 3.80 - 5.10 Million/uL   Hemoglobin 12.5 11.7 - 15.5 g/dL   HCT 61.9 64.9 - 54.9 %   MCV 91.3 80.0 - 100.0 fL   MCH 30.0 27.0 - 33.0 pg    MCHC 32.9 32.0 - 36.0 g/dL   RDW 87.6 88.9 - 84.9 %   Platelets 208 140 - 400 Thousand/uL   MPV 9.1 7.5 - 12.5 fL   Neutro Abs 1,613 1,500 - 7,800 cells/uL   Absolute Lymphocytes 1,411 850 - 3,900 cells/uL   Absolute Monocytes 324 200 - 950 cells/uL   Eosinophils Absolute 223 15 - 500 cells/uL   Basophils Absolute 29 0 - 200 cells/uL   Neutrophils Relative % 44.8 %   Total Lymphocyte 39.2 %   Monocytes Relative 9.0 %   Eosinophils Relative 6.2 %   Basophils Relative 0.8 %  Lipid  panel   Collection Time: 01/09/24  8:10 AM  Result Value Ref Range   Cholesterol 204 (H) <200 mg/dL   HDL 70 > OR = 50 mg/dL   Triglycerides 91 <849 mg/dL   LDL Cholesterol (Calc) 115 (H) mg/dL (calc)   Total CHOL/HDL Ratio 2.9 <5.0 (calc)   Non-HDL Cholesterol (Calc) 134 (H) <130 mg/dL (calc)  Hemoglobin J8r   Collection Time: 01/09/24  8:10 AM  Result Value Ref Range   Hgb A1c MFr Bld 5.3 <5.7 %   Mean Plasma Glucose 105 mg/dL   eAG (mmol/L) 5.8 mmol/L      Assessment & Plan:   Problem List Items Addressed This Visit     GERD (gastroesophageal reflux disease)   Relevant Medications   omeprazole  (PRILOSEC) 20 MG capsule   Hot flash, menopausal   Relevant Medications   PARoxetine  (PAXIL ) 20 MG tablet   Hyperlipidemia associated with type 2 diabetes mellitus (HCC)   Relevant Medications   OZEMPIC , 2 MG/DOSE, 8 MG/3ML SOPN   OSA on CPAP   Type 2 diabetes mellitus with other specified complication (HCC)   Relevant Medications   OZEMPIC , 2 MG/DOSE, 8 MG/3ML SOPN   Other Visit Diagnoses       Annual physical exam    -  Primary     Need for Streptococcus pneumoniae vaccination       Relevant Orders   Pneumococcal conjugate vaccine 20-valent (Completed)     Postmenopausal estrogen deficiency       Relevant Orders   DG Bone Density     Encounter for screening mammogram for malignant neoplasm of breast       Relevant Orders   MM 3D SCREENING MAMMOGRAM BILATERAL BREAST     Adjustment  disorder with depressed mood       Relevant Medications   buPROPion  (WELLBUTRIN  XL) 300 MG 24 hr tablet     RLS (restless legs syndrome)       Relevant Medications   rOPINIRole  (REQUIP ) 1 MG tablet     Long-term current use of injectable noninsulin antidiabetic medication            Updated Health Maintenance information Reviewed recent lab results with patient Encouraged improvement to lifestyle with diet and exercise Goal of weight loss  Essential Tremor New diagnosis Intermittent action tremor noted during fine motor tasks. No evidence of resting tremor. Explained benign nature and management options. - Document essential tremor in chart. - Educated on essential tremor. - Encouraged upper body strength exercises.  Type 2 Diabetes Mellitus A1c at 5.3, excellent control with Ozempic . - Continue Ozempic  as prescribed.  Hyperlipidemia LDL at 115, low-risk range. Normal coronary artery calcium score. Heart risk score 5-6% over ten years. - Monitor cholesterol levels. - Discuss potential cholesterol medication use.  Gastroesophageal Reflux Disease (GERD) Omeprazole  management transitioned to primary care. - Prescribe omeprazole  for 90 days.  Depression Managed with bupropion . - Prescribe bupropion  for 90 days.  Restless Legs Syndrome Uses ropinirole  as needed, typically one tablet daily. - Prescribe ropinirole  for 90 days.  General Health Maintenance Due for health maintenance activities including vaccinations and screenings. - Administer Prevnar 20 vaccine. - Order bone density scan (DEXA). - Order mammogram. - Discuss timing and scheduling of mammogram and DEXA. - Review tetanus and COVID booster status.  Follow-up Schedule follow-up before holidays. - Schedule follow-up in five to six months.         Orders Placed This Encounter  Procedures   DG Bone Density  Standing Status:   Future    Expiration Date:   01/15/2025    Reason for Exam (SYMPTOM  OR  DIAGNOSIS REQUIRED):   screening osteoporosis postmenopausal estrogen deficiency    Preferred imaging location?:   [facility]   MM 3D SCREENING MAMMOGRAM BILATERAL BREAST    Standing Status:   Future    Expiration Date:   01/15/2025    Reason for Exam (SYMPTOM  OR DIAGNOSIS REQUIRED):   Screening bilateral 3D Mammogram Tomo    Preferred imaging location?:   [facility]   Pneumococcal conjugate vaccine 20-valent    Meds ordered this encounter  Medications   omeprazole  (PRILOSEC) 20 MG capsule    Sig: Take 1 capsule (20 mg total) by mouth 2 (two) times daily before a meal.    Dispense:  180 capsule    Refill:  3   buPROPion  (WELLBUTRIN  XL) 300 MG 24 hr tablet    Sig: Take 1 tablet (300 mg total) by mouth daily.    Dispense:  90 tablet    Refill:  3   OZEMPIC , 2 MG/DOSE, 8 MG/3ML SOPN    Sig: INJECT 2MG  SUBCUTANEOUSLY ONCE A WEEK    Dispense:  3 mL    Refill:  5   PARoxetine  (PAXIL ) 20 MG tablet    Sig: Take 1 tablet (20 mg total) by mouth daily.    Dispense:  90 tablet    Refill:  3    Add refills   rOPINIRole  (REQUIP ) 1 MG tablet    Sig: Take 1-2 tablets (1-2 mg total) by mouth at bedtime.    Dispense:  180 tablet    Refill:  3     Follow up plan: Return for 6 month DM A1c.  Marsa Officer, DO [facility] 01/16/2024, 9:06 AM

## 2024-01-26 ENCOUNTER — Telehealth: Admitting: Family Medicine

## 2024-01-26 DIAGNOSIS — H5789 Other specified disorders of eye and adnexa: Secondary | ICD-10-CM

## 2024-01-26 DIAGNOSIS — H5712 Ocular pain, left eye: Secondary | ICD-10-CM

## 2024-01-26 NOTE — Progress Notes (Signed)

## 2024-02-20 ENCOUNTER — Telehealth: Payer: Self-pay

## 2024-02-20 NOTE — Telephone Encounter (Signed)
 Patient stopped by office to check on the status of PA for Ozempic . Please advise. Thanks!

## 2024-02-21 ENCOUNTER — Telehealth: Payer: Self-pay | Admitting: Pharmacy Technician

## 2024-02-21 ENCOUNTER — Other Ambulatory Visit (HOSPITAL_COMMUNITY): Payer: Self-pay

## 2024-02-21 NOTE — Telephone Encounter (Signed)
 PA request has been Started. New Encounter has been or will be created for follow up. For additional info see Pharmacy Prior Auth telephone encounter from 02/21/2024.

## 2024-02-21 NOTE — Telephone Encounter (Signed)
 Pharmacy Patient Advocate Encounter   Received notification from Pt Calls Messages that prior authorization for OZEMPIC  (2MG /DOSE) 8MG /3ML AUTO-INJECTORS is required/requested.   Insurance verification completed.   The patient is insured through [facility] ADVANTAGE/RX ADVANCE .   Per test claim: PA required; PA submitted to above mentioned insurance via LATENT Key/confirmation #/EOC [id number] Status is pending

## 2024-02-21 NOTE — Telephone Encounter (Signed)
 Pharmacy Patient Advocate Encounter  Received notification from [facility] ADVANTAGE/RX ADVANCE that Prior Authorization for OZEMPIC  (2MG /DOSE) 8MG /3ML AUTO-INJECTORS  has been APPROVED from 02/21/24 to 02/20/25. Ran test claim, Copay is $47.00. This test claim was processed through [facility]- copay amounts may vary at other pharmacies due to pharmacy/plan contracts, or as the patient moves through the different stages of their insurance plan.   PA #/Case ID/Reference #: [id number]

## 2024-02-27 ENCOUNTER — Telehealth: Payer: Self-pay | Admitting: Pharmacist

## 2024-02-27 NOTE — Progress Notes (Signed)
   02/27/2024  Patient ID: Ashley Hale, female   DOB: [birth year], 63 y.o.   MRN: [id number]  This patient is appearing on a report for adherence measure for diabetes medications this calendar year.   Medication: Ozempic 2 mg weekly Last fill date: 01/06/2024 for 28 day supply  Per review of chart, prescription was in need of prior authorization & PA was submitted and approved 02/21/24 to 02/20/25.  Outreach to [employer] today on behalf of patient to let pharmacy know. Speak with [facility] who processes Ozempic refill through patient's insurance and confirms claim goes through plan and cost is $47.  Outreach to patient today to provide update, but was unable to reach patient via telephone and have left HIPAA compliant voicemail asking patient to return my call.  - Will also send MyChart message in attempt to reach patient.  Sharyle Sia, PharmD, [facility] Clinical Pharmacist [facility] [phone]

## 2024-03-26 ENCOUNTER — Encounter: Payer: Self-pay | Admitting: Pharmacist

## 2024-03-26 NOTE — Progress Notes (Addendum)
   03/26/2024  Patient ID: Ashley Hale, female   DOB: 24-Nov-1960, 63 y.o.   MRN: [id number]  This patient is appearing on a report for the adherence measure for diabetes medications this calendar year.   Medication: Ozempic  2 mg Last fill date: 02/27/2024 for 28 day supply  Outreach to [employer] today. Confirms Ozempic  prescription for patient last filled & picked up on 02/27/2024  Left voicemail for patient to return my call at their convenience.   Receive call back from patient who denies missed doses of Ozempic  2 mg weekly, but confirms is in need of a refill.  Patient asks about possible cost savings for the Ozempic . Shares that she has both [employer] Medicare coverage and [employer] [facility] [employer] coverage. 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 Advises that it is not possible to split bill from one of these plans to the other, rather can only process through one of these plans at a time. Provide Ozempic  manufacturer savings card for patient:  BIN: [id number] PCN: [id number] Group: [id number] ID: [id number]  RPh Sam processes patient's Ozempic  prescription through the [facility] Plan and the savings card and confirms patient's cost is now $25/month - Provide update to patient   Sharyle Sia, PharmD, [facility] [phone]

## 2024-03-28 ENCOUNTER — Other Ambulatory Visit (HOSPITAL_COMMUNITY): Payer: Self-pay

## 2024-03-29 DIAGNOSIS — L281 Prurigo nodularis: Secondary | ICD-10-CM | POA: Diagnosis not present

## 2024-06-04 ENCOUNTER — Ambulatory Visit (INDEPENDENT_AMBULATORY_CARE_PROVIDER_SITE_OTHER)

## 2024-06-04 DIAGNOSIS — Z23 Encounter for immunization: Secondary | ICD-10-CM | POA: Diagnosis not present

## 2024-06-08 ENCOUNTER — Ambulatory Visit (INDEPENDENT_AMBULATORY_CARE_PROVIDER_SITE_OTHER): Admitting: Internal Medicine

## 2024-06-08 ENCOUNTER — Encounter: Payer: Self-pay | Admitting: Internal Medicine

## 2024-06-08 VITALS — BP 118/74 | HR 89 | Ht 69.0 in | Wt 192.6 lb

## 2024-06-08 DIAGNOSIS — J069 Acute upper respiratory infection, unspecified: Secondary | ICD-10-CM | POA: Diagnosis not present

## 2024-06-08 LAB — POC COVID19/FLU A&B COMBO
Covid Antigen, POC: NEGATIVE
Influenza A Antigen, POC: NEGATIVE
Influenza B Antigen, POC: NEGATIVE

## 2024-06-08 MED ORDER — AZITHROMYCIN 250 MG PO TABS: ORAL_TABLET | ORAL | 0 refills | Status: DC

## 2024-06-08 MED ORDER — METHYLPREDNISOLONE ACETATE 80 MG/ML IJ SUSP
80.0000 mg | Freq: Once | INTRAMUSCULAR | Status: AC
  Administered 2024-06-08: 80 mg via INTRAMUSCULAR

## 2024-06-08 NOTE — Progress Notes (Signed)
 Subjective:    Patient ID: Ashley Hale, female    DOB: 01-21-1961, 63 y.o.   MRN: [id number]  HPI    Review of Systems   Past Medical History:  Diagnosis Date   Anxiety    Depression    GERD (gastroesophageal reflux disease)    IBS (irritable bowel syndrome)    Obesity    Sleep apnea     Current Outpatient Medications  Medication Sig Dispense Refill   albuterol  (VENTOLIN  HFA) 108 (90 Base) MCG/ACT inhaler Inhale into the lungs every 6 (six) hours as needed for wheezing or shortness of breath.     BLACK COHOSH EXTRACT PO Take 1 capsule by mouth daily.     buPROPion  (WELLBUTRIN  XL) 300 MG 24 hr tablet Take 1 tablet (300 mg total) by mouth daily. 90 tablet 3   clobetasol ointment (TEMOVATE) 0.05 % Apply 1 Application topically 2 (two) times daily.     cyclobenzaprine  (FLEXERIL ) 10 MG tablet Take 0.5-1 tablets (5-10 mg total) by mouth 3 (three) times daily as needed for muscle spasms. Recommend start at night only 30 tablet 2   Dietary Management Product (VASCULERA PO) Take by mouth.     fluticasone  (FLONASE ) 50 MCG/ACT nasal spray Place 2 sprays into both nostrils daily. Use for 4-6 weeks then stop and use seasonally or as needed. (Patient not taking: Reported on 01/16/2024) 16 g 3   furosemide  (LASIX ) 40 MG tablet Take by mouth.     hydrOXYzine  (ATARAX ) 25 MG tablet Take 25 mg by mouth daily.     mometasone (ELOCON) 0.1 % ointment Apply topically.     Multiple Vitamins-Minerals (HAIR/SKIN/NAILS) CAPS Take 1 capsule by mouth daily.     omeprazole  (PRILOSEC) 20 MG capsule Take 1 capsule (20 mg total) by mouth 2 (two) times daily before a meal. 180 capsule 3   OZEMPIC , 2 MG/DOSE, 8 MG/3ML SOPN INJECT 2MG  SUBCUTANEOUSLY ONCE A WEEK 3 mL 5   PARoxetine  (PAXIL ) 20 MG tablet Take 1 tablet (20 mg total) by mouth daily. 90 tablet 3   polyethylene glycol powder (GLYCOLAX /MIRALAX ) 17 GM/SCOOP powder Mix 1 capful in a Drink Once daily for Constipation.     rOPINIRole  (REQUIP ) 1 MG tablet  Take 1-2 tablets (1-2 mg total) by mouth at bedtime. 180 tablet 3   No current facility-administered medications for this visit.    Allergies  Allergen Reactions   Other Other (See Comments)    GAUZE used for dressing = Bleeding and severe skin stinging   Shellfish Allergy Nausea And Vomiting   Sulfa Antibiotics Hives and Other (See Comments)    STOMACH CRAMPS, also    Family History  Problem Relation Age of Onset   Heart disease Mother    Hypertension Mother    Hyperlipidemia Mother    Heart disease Father    Glaucoma Father    Breast cancer Neg Hx     Social History   Socioeconomic History   Marital status: Married    Spouse name: Not on file   Number of children: Not on file   Years of education: Not on file   Highest education level: Associate degree: academic program  Occupational History   Not on file  Tobacco Use   Smoking status: Never   Smokeless tobacco: Never  Vaping Use   Vaping status: Never Used  Substance and Sexual Activity   Alcohol use: No    Alcohol/week: 0.0 standard drinks of alcohol   Drug use: No  Sexual activity: Not on file  Other Topics Concern   Not on file  Social History Narrative   Not on file   Social Drivers of Health   Financial Resource Strain: Low Risk  (01/16/2024)   Overall Financial Resource Strain (CARDIA)    Difficulty of Paying Living Expenses: Not very hard  Food Insecurity: No Food Insecurity (01/16/2024)   Hunger Vital Sign    Worried About Running Out of Food in the Last Year: Never true    Ran Out of Food in the Last Year: Never true  Transportation Needs: No Transportation Needs (01/16/2024)   PRAPARE - Administrator, Civil Service (Medical): No    Lack of Transportation (Non-Medical): No  Physical Activity: Insufficiently Active (07/04/2023)   Exercise Vital Sign    Days of Exercise per Week: 7 days    Minutes of Exercise per Session: 10 min  Stress: Stress Concern Present (01/16/2024)    [employer] of Occupational Health - Occupational Stress Questionnaire    Feeling of Stress: Very much  Social Connections: Moderately Integrated (01/16/2024)   Social Connection and Isolation Panel    Frequency of Communication with Friends and Family: Once a week    Frequency of Social Gatherings with Friends and Family: Once a week    Attends Religious Services: More than 4 times per year    Active Member of [employer] or Organizations: Yes    Attends Engineer, Structural: More than 4 times per year    Marital Status: Married  Catering Manager Violence: Not At Risk (07/04/2023)   Humiliation, Afraid, Rape, and Kick questionnaire    Fear of Current or Ex-Partner: No    Emotionally Abused: No    Physically Abused: No    Sexually Abused: No     Constitutional: Pt reports headache, fever and chills. Denies fatigue, or abrupt weight changes.  HEENT: Pt reports ear fullness, sore throat and sinus pressure. Denies eye pain, eye redness, ear pain, ringing in the ears, wax buildup, runny nose, nasal congestion, bloody nose. Respiratory: Pt reports cough. Denies difficulty breathing, shortness of breath.   Cardiovascular: Denies chest pain, chest tightness, palpitations or swelling in the hands or feet.  Gastrointestinal: Pt reports nausea. Denies abdominal pain, bloating, constipation, diarrhea or blood in the stool.  GU: Denies urgency, frequency, pain with urination, burning sensation, blood in urine, odor or discharge. Musculoskeletal: Pt reports body aches. Denies decrease in range of motion, difficulty with gait, or joint swelling.  Skin: Denies redness, rashes, lesions or ulcercations.  Neurological: Denies dizziness, difficulty with memory, difficulty with speech or problems with balance and coordination.    No other specific complaints in a complete review of systems (except as listed in HPI above).      Objective:   Physical Exam  BP 118/74 (BP Location: Right Arm,  Patient Position: Sitting, Cuff Size: Normal)   Pulse 89   Ht 5' 9 (1.753 m)   Wt 192 lb 9.6 oz (87.4 kg)   SpO2 95%   BMI 28.44 kg/m   Wt Readings from Last 3 Encounters:  01/16/24 187 lb 9.6 oz (85.1 kg)  11/24/23 187 lb 6.4 oz (85 kg)  07/18/23 198 lb 6.4 oz (90 kg)    General: Appears her stated age, appears unwell but in NAD. Skin: Warm, dry and intact.  HEENT: Head: normal shape and size, maxillary sinus tenderness noted; Eyes: sclera white, no icterus, conjunctiva pink, PERRLA and EOMs intact; Ears: Tm's gray and  intact, normal light reflex; Nose: mucosa pink and moist, septum midline; Throat/Mouth: Teeth present, mucosa pink and moist, + PND, no exudate, lesions or ulcerations noted.  Neck: No adenopathy noted. Cardiovascular: Normal rate and rhythm. S1,S2 noted.  No murmur, rubs or gallops noted.  Pulmonary/Chest: Normal effort and positive vesicular breath sounds. No respiratory distress. No wheezes, rales or ronchi noted.  Musculoskeletal: No difficulty with gait.  Neurological: Alert and oriented.   BMET    Component Value Date/Time   NA 140 01/09/2024 0810   K 4.7 01/09/2024 0810   CL 103 01/09/2024 0810   CO2 31 01/09/2024 0810   GLUCOSE 94 01/09/2024 0810   BUN 15 01/09/2024 0810   CREATININE 1.16 (H) 01/09/2024 0810   CALCIUM 9.2 01/09/2024 0810   GFRNONAA >60 10/14/2022 1324   GFRNONAA 59 (L) 11/03/2018 0837   GFRAA 68 11/03/2018 0837    Lipid Panel     Component Value Date/Time   CHOL 204 (H) 01/09/2024 0810   TRIG 91 01/09/2024 0810   HDL 70 01/09/2024 0810   CHOLHDL 2.9 01/09/2024 0810   VLDL 31 09/09/2015 0745   LDLCALC 115 (H) 01/09/2024 0810    CBC    Component Value Date/Time   WBC 3.6 (L) 01/09/2024 0810   RBC 4.16 01/09/2024 0810   HGB 12.5 01/09/2024 0810   HGB 11.7 09/15/2022 1057   HCT 38.0 01/09/2024 0810   HCT 35.7 09/15/2022 1057   PLT 208 01/09/2024 0810   PLT 281 09/15/2022 1057   MCV 91.3 01/09/2024 0810   MCV 87  09/15/2022 1057   MCH 30.0 01/09/2024 0810   MCHC 32.9 01/09/2024 0810   RDW 12.3 01/09/2024 0810   RDW 12.6 09/15/2022 1057   LYMPHSABS 1,512 12/20/2022 0837   LYMPHSABS 1.9 09/15/2022 1057   MONOABS 1.0 (H) 07/25/2017 1713   EOSABS 223 01/09/2024 0810   EOSABS 0.2 09/15/2022 1057   BASOSABS 29 01/09/2024 0810   BASOSABS 0.0 09/15/2022 1057    Hgb A1C Lab Results  Component Value Date   HGBA1C 5.3 01/09/2024            Assessment & Plan:  Assessment and Plan    Acute upper respiratory infection with cough Negative for COVID-19 and influenza. Likely viral etiology with risk of bacterial progression. Discussed steroid shot benefits and antibiotic consideration due to previous partial response to Levaquin . - Administered 80 mg Depo-Medrol  IM x 1 - Prescribed Z-Pak (azithromycin ) 250 mg x 5 days. - Recommended Zyrtec and Flonase  OTC.      Follow-up with your PCP as previously scheduled Angeline Laura, NP

## 2024-06-08 NOTE — Patient Instructions (Signed)

## 2024-06-22 LAB — OPHTHALMOLOGY REPORT-SCANNED

## 2024-06-25 ENCOUNTER — Ambulatory Visit: Admitting: Family Medicine

## 2024-06-27 DIAGNOSIS — Z79899 Other long term (current) drug therapy: Secondary | ICD-10-CM | POA: Diagnosis not present

## 2024-06-27 DIAGNOSIS — L281 Prurigo nodularis: Secondary | ICD-10-CM | POA: Diagnosis not present

## 2024-06-28 ENCOUNTER — Ambulatory Visit (INDEPENDENT_AMBULATORY_CARE_PROVIDER_SITE_OTHER): Admitting: Family Medicine

## 2024-06-28 ENCOUNTER — Encounter: Payer: Self-pay | Admitting: Family Medicine

## 2024-06-28 ENCOUNTER — Other Ambulatory Visit: Payer: Self-pay | Admitting: Family Medicine

## 2024-06-28 VITALS — BP 118/64 | HR 84 | Ht 69.0 in | Wt 191.0 lb

## 2024-06-28 DIAGNOSIS — E785 Hyperlipidemia, unspecified: Secondary | ICD-10-CM

## 2024-06-28 DIAGNOSIS — G2581 Restless legs syndrome: Secondary | ICD-10-CM

## 2024-06-28 DIAGNOSIS — E1169 Type 2 diabetes mellitus with other specified complication: Secondary | ICD-10-CM

## 2024-06-28 DIAGNOSIS — L281 Prurigo nodularis: Secondary | ICD-10-CM | POA: Diagnosis not present

## 2024-06-28 DIAGNOSIS — J0110 Acute frontal sinusitis, unspecified: Secondary | ICD-10-CM

## 2024-06-28 DIAGNOSIS — I77810 Thoracic aortic ectasia: Secondary | ICD-10-CM

## 2024-06-28 DIAGNOSIS — Z780 Asymptomatic menopausal state: Secondary | ICD-10-CM

## 2024-06-28 DIAGNOSIS — F3341 Major depressive disorder, recurrent, in partial remission: Secondary | ICD-10-CM | POA: Diagnosis not present

## 2024-06-28 DIAGNOSIS — Z1231 Encounter for screening mammogram for malignant neoplasm of breast: Secondary | ICD-10-CM | POA: Diagnosis not present

## 2024-06-28 DIAGNOSIS — Z7985 Long-term (current) use of injectable non-insulin antidiabetic drugs: Secondary | ICD-10-CM

## 2024-06-28 DIAGNOSIS — Z23 Encounter for immunization: Secondary | ICD-10-CM | POA: Diagnosis not present

## 2024-06-28 DIAGNOSIS — G4733 Obstructive sleep apnea (adult) (pediatric): Secondary | ICD-10-CM | POA: Diagnosis not present

## 2024-06-28 DIAGNOSIS — Z0000 Encounter for general adult medical examination without abnormal findings: Secondary | ICD-10-CM

## 2024-06-28 LAB — POCT GLYCOSYLATED HEMOGLOBIN (HGB A1C): Hemoglobin A1C: 5.1 % (ref 4.0–5.6)

## 2024-06-28 MED ORDER — IPRATROPIUM BROMIDE 0.06 % NA SOLN: 2.0000 | Freq: Four times a day (QID) | NASAL | 0 refills | Status: AC

## 2024-06-28 MED ORDER — OZEMPIC (2 MG/DOSE) 8 MG/3ML ~~LOC~~ SOPN: PEN_INJECTOR | SUBCUTANEOUS | 5 refills | Status: AC

## 2024-06-28 NOTE — Patient Instructions (Addendum)
 Thank you for coming to the office today.  For Mammogram screening for breast cancer and DEXA Scan (Bone mineral density) screening for osteoporosis  Call the Imaging Center below anytime to schedule your own appointment now that order has been placed.  [facility] [address], [address] [place], [place] [address] Phone: [phone]   Recent Labs    07/04/23 [phone] 01/09/24 0810 06/28/24 0806  HGBA1C 5.4 5.3 5.1   Refilled Ozempic   Last year CT showed very mild dilated aorta 4 cm, recommended but not required to repeat CTA imaging yearly for now. We can reconsider in 6 months.  DUE for FASTING BLOOD WORK (no food or drink after midnight before the lab appointment, only water or coffee without cream/sugar on the morning of)  SCHEDULE Lab Only visit in the morning at the clinic for lab draw in 6 MONTHS   - Make sure Lab Only appointment is at about 1 week before your next appointment, so that results will be available  For Lab Results, once available within 2-3 days of blood draw, you can can log in to MyChart online to view your results and a brief explanation. Also, we can discuss results at next follow-up visit.   Please schedule a Follow-up Appointment to: Return for 6 month fasting lab > 1 week later Annual Physical.  If you have any other questions or concerns, please feel free to call the office or send a message through MyChart. You may also schedule an earlier appointment if necessary.  Additionally, you may be receiving a survey about your experience at our office within a few days to 1 week by e-mail or mail. We value your feedback.  Marsa Officer, DO [facility], [place]

## 2024-06-28 NOTE — Progress Notes (Signed)
 Subjective:    Patient ID: Ashley Hale, female    DOB: 01-30-1961, 63 y.o.   MRN: [id number]  Ashley Hale is a 63 y.o. female presenting on 06/28/2024 for Diabetes   HPI  Discussed the use of AI scribe software for clinical note transcription with the patient, who gave verbal consent to proceed.  History of Present Illness   [name] is a 63 year old female who presents for a routine follow-up visit.   Blood pressure evaluation - Blood pressure measured at 140/76 during visit, higher than usual - Attributes elevated reading to a busy day and anxiety  Hyperlipidemia Lipid profile and cardiovascular risk assessment - LDL cholesterol 115 mg/dL; previous values ranged from 95 to 160 mg/dL - Normal coronary artery calcium score six months ago    Type 2 Diabetes Overweight BMI >28 A1c down to 5.1 Dramatic weight loss over past 2 years on GLP1 therapy - Continues on medication Ozempic  2mg  weekly. Inj Off Metformin  - Weight stable at approximately 191 pounds for the past two years - Previously reached a low of 187 pounds but unable to maintain it - Desires to lose an additional 20 pounds   OSA on CPAP Not always using CPAP due to intolerance Working with [facility] - Dr Ivery They have repeated sleep study after lost weight, still severe sleep apnea still Considering Inspire surgery if CPAP inadequate control   Adjustment disorder depressed mood Improved on Wellbutrin  XL 300mg  daily   RLS Taking Ropinirole , does need something in afternoon at times or stronger dose in PM taking 1-2 with good results  Prurigo Nodularis Followed by [facility] On immune suppression therapy injection   Sinusitis Recurrent mild problem, requesting nasal spray No fever or purulence or new concerns. Mild symptoms, prefers to avoid future bronchitis or pneumonia     Health Maintenance:   Mammogram last done 11/24/22   Colonoscopy completed after 2 day prep -  06/20/23, negative, repeat 10 years. 2034  TDap vaccine today   UTD Flu vaccine, Prevnar-20    Updated Shingrix      06/28/2024    8:42 AM 01/16/2024    8:47 AM 11/24/2023    3:26 PM  Depression screen PHQ 2/9  Decreased Interest 1 1 0  Down, Depressed, Hopeless 1 1 0  PHQ - 2 Score 2 2 0  Altered sleeping 2 2 1   Tired, decreased energy 1 1 1   Change in appetite 1 0 0  Feeling bad or failure about yourself  1 1 0  Trouble concentrating 1 1 1   Moving slowly or fidgety/restless 1 1 0  Suicidal thoughts 0 0 0  PHQ-9 Score 9 8  3    Difficult doing work/chores Not difficult at all Not difficult at all Not difficult at all     Data saved with a previous flowsheet row definition       06/28/2024    8:42 AM 01/16/2024    8:47 AM 11/24/2023    3:26 PM 07/04/2023    9:42 AM  GAD 7 : Generalized Anxiety Score  Nervous, Anxious, on Edge 1 1 1 1   Control/stop worrying 1 1 1 1   Worry too much - different things 3 1 1 1   Trouble relaxing 2 1 1 1   Restless 3 1 1 2   Easily annoyed or irritable 1 1 1 1   Afraid - awful might happen 1 0 0 0  Total GAD 7 Score 12 6 6 7   Anxiety Difficulty  Not difficult at all Not difficult at all Not difficult at all     Social History[1]  Review of Systems Per HPI unless specifically indicated above     Objective:    BP 118/64 (BP Location: Left Arm, Cuff Size: Normal)   Pulse 84   Ht 5' 9 (1.753 m)   Wt 191 lb (86.6 kg)   SpO2 96%   BMI 28.21 kg/m   Wt Readings from Last 3 Encounters:  06/28/24 191 lb (86.6 kg)  06/08/24 192 lb 9.6 oz (87.4 kg)  01/16/24 187 lb 9.6 oz (85.1 kg)    Physical Exam Vitals and nursing note reviewed.  Constitutional:      General: She is not in acute distress.    Appearance: She is well-developed. She is not diaphoretic.     Comments: Well-appearing, comfortable, cooperative  HENT:     Head: Normocephalic and atraumatic.  Eyes:     General:        Right eye: No discharge.        Left eye: No  discharge.     Conjunctiva/sclera: Conjunctivae normal.  Neck:     Thyroid : No thyromegaly.  Cardiovascular:     Rate and Rhythm: Normal rate and regular rhythm.     Heart sounds: Normal heart sounds. No murmur heard. Pulmonary:     Effort: Pulmonary effort is normal. No respiratory distress.     Breath sounds: Normal breath sounds. No wheezing or rales.  Musculoskeletal:        General: Normal range of motion.     Cervical back: Normal range of motion and neck supple.  Lymphadenopathy:     Cervical: No cervical adenopathy.  Skin:    General: Skin is warm and dry.     Findings: No erythema or rash.  Neurological:     Mental Status: She is alert and oriented to person, place, and time.  Psychiatric:        Behavior: Behavior normal.     Comments: Well groomed, good eye contact, normal speech and thoughts     I have personally reviewed the radiology report from 07/06/23 CT Coronary Calcium Score.    ADDENDUM REPORT: 07/24/2023 00:52   EXAM: OVER-READ INTERPRETATION  CT CHEST   The following report is an over-read performed by radiologist Dr. Oneil Devonshire of [facility] on 07/24/2023. This over-read does not include interpretation of cardiac or coronary anatomy or pathology. The coronary calcium score interpretation by the cardiologist is attached.   COMPARISON:  None.   FINDINGS: Cardiovascular: Mild prominence of the ascending aorta to 4 cm is noted.   Mediastinum/Nodes: There are no enlarged lymph nodes within the visualized mediastinum.   Lungs/Pleura: There is no pleural effusion. The visualized lungs appear clear.   Upper abdomen: No significant findings in the visualized upper abdomen.   Musculoskeletal/Chest wall: No chest wall mass or suspicious osseous findings within the visualized chest.   IMPRESSION: Mild dilatation of the ascending aorta to 4 cm. Recommend annual imaging followup by CTA or MRA. This recommendation follows  2010 ACCF/AHA/AATS/ACR/ASA/SCA/SCAI/SIR/STS/SVM Guidelines for the Diagnosis and Management of Patients with Thoracic Aortic Disease. Circulation. 2010; 121: Z733-z630. Aortic aneurysm NOS (ICD10-I71.9)     Electronically Signed   By: Oneil Devonshire M.D.   On: 07/24/2023 00:52    Addended by Devonshire Oneil, MD on 07/24/2023 12:54 AM    Study Result  Narrative & Impression  CLINICAL DATA:  Risk stratification   EXAM: Coronary Calcium Score  TECHNIQUE: The patient was scanned on a Siemens Somatom scanner. Axial non-contrast 3 mm slices were carried out through the heart. The data set was analyzed on a dedicated work station and scored using the Agatson method.   FINDINGS: Non-cardiac: See separate report from [facility].   Ascending Aorta: Normal size   Pericardium: Normal   Coronary arteries: Normal origin of left and right coronary arteries. Distribution of arterial calcifications if present, as noted below;   LM 0   LAD 0   LCx 0   RCA 0   Total 0   IMPRESSION AND RECOMMENDATION: 1. Coronary calcium score of 0.   2. CAC 0, CAC-DRS A0.   3. Continue heart healthy lifestyle and risk factor modification.   Electronically Signed: By: Redell Cave M.D. On: 07/07/2023 08:43     Results for orders placed or performed in visit on 06/28/24  POCT HgB A1C   Collection Time: 06/28/24  8:06 AM  Result Value Ref Range   Hemoglobin A1C 5.1 4.0 - 5.6 %   HbA1c POC (<> result, manual entry)     HbA1c, POC (prediabetic range)     HbA1c, POC (controlled diabetic range)        Assessment & Plan:   Problem List Items Addressed This Visit     Hyperlipidemia associated with type 2 diabetes mellitus (HCC)   Relevant Medications   OZEMPIC , 2 MG/DOSE, 8 MG/3ML SOPN   Mild ascending aorta dilatation   OSA on CPAP   Prurigo nodularis   Type 2 diabetes mellitus with other specified complication (HCC) - Primary   Relevant Medications   OZEMPIC , 2  MG/DOSE, 8 MG/3ML SOPN   Other Relevant Orders   POCT HgB A1C (Completed)   Other Visit Diagnoses       Need for diphtheria-tetanus-pertussis (Tdap) vaccine       Relevant Orders   Tdap vaccine greater than or equal to 7yo IM (Completed)     Encounter for screening mammogram for malignant neoplasm of breast         Postmenopausal estrogen deficiency         Acute non-recurrent frontal sinusitis       Relevant Medications   ipratropium (ATROVENT ) 0.06 % nasal spray     Major depressive disorder, recurrent, in partial remission         RLS (restless legs syndrome)            Blood pressure normalized upon recheck. Weight stable. Vaccinations up to date except for optional boosters. Bone density scan and mammogram due. - Administered tetanus booster. - Ordered mammogram and DEXA bone scan. - Scheduled early morning physical for June next year.  Type 2 diabetes mellitus with other specified complication - Hyperlipidemia, OSA Diabetes well-controlled with HbA1c of 5.1. Weight stable. - Continue Ozempic  2 mg weekly. - Refilled Ozempic  for six months. Updated screenings  Hyperlipidemia Last lab 12/2023, LDL 115 Encourage continued lifestyle management Declines Statin therapy at this time. Normal coronary CT score 0  OSA on CPAP Inadequate control on CPAP, she is working with ENT and evaluating this for several months with data to support indication for Inspire in future  Major Depression recurrent in partial remission Controlled On Wellbutrin  XL daily  Prurigo nodularis Per Dermatology Chronic condition with improvement from 27 to 7 nodules. Managed with monthly injections w/ immunosuppression - Continue monthly injections for prurigo nodularis.  Dilated ascending aorta mild Mild dilation at 4 cm, not an aneurysm. No coronary artery  buildup, excellent CT Coronary Score. Annual imaging recommended. We discussed risk vs benefit and do recommend repeat imaging but agreed to defer  until summer 2026.  Acute frontal sinusitis Intermittent symptoms with nasal drainage. Self-limiting, not severe enough for antibiotics. - Prescribed Atrovent  nasal spray for symptomatic relief.      RLS Controlled on Ropinirole   Orders Placed This Encounter  Procedures   Tdap vaccine greater than or equal to 7yo IM   POCT HgB A1C    Meds ordered this encounter  Medications   OZEMPIC , 2 MG/DOSE, 8 MG/3ML SOPN    Sig: INJECT 2MG  SUBCUTANEOUSLY ONCE A WEEK    Dispense:  3 mL    Refill:  5   ipratropium (ATROVENT ) 0.06 % nasal spray    Sig: Place 2 sprays into both nostrils 4 (four) times daily. For up to 5-7 days then stop.    Dispense:  15 mL    Refill:  0    Follow up plan: Return for 6 month fasting lab > 1 week later Annual Physical.  Future labs ordered for 12/24/24   Marsa Officer, DO [facility] 06/28/2024, 8:07 AM     [1]  Social History Tobacco Use   Smoking status: Never   Smokeless tobacco: Never  Vaping Use   Vaping status: Never Used  Substance Use Topics   Alcohol use: No    Alcohol/week: 0.0 standard drinks of alcohol   Drug use: No

## 2024-07-31 ENCOUNTER — Ambulatory Visit
Admission: EM | Admit: 2024-07-31 | Discharge: 2024-07-31 | Disposition: A | Discharge status: Home / Self Care | Attending: Physician Assistant | Admitting: Physician Assistant

## 2024-07-31 ENCOUNTER — Ambulatory Visit: Payer: Self-pay | Admitting: Physician Assistant

## 2024-07-31 DIAGNOSIS — R197 Diarrhea, unspecified: Secondary | ICD-10-CM | POA: Diagnosis not present

## 2024-07-31 DIAGNOSIS — R112 Nausea with vomiting, unspecified: Secondary | ICD-10-CM | POA: Insufficient documentation

## 2024-07-31 DIAGNOSIS — N390 Urinary tract infection, site not specified: Secondary | ICD-10-CM | POA: Insufficient documentation

## 2024-07-31 DIAGNOSIS — R109 Unspecified abdominal pain: Secondary | ICD-10-CM | POA: Diagnosis not present

## 2024-07-31 LAB — CBC WITH DIFFERENTIAL/PLATELET
Abs Immature Granulocytes: 0.02 K/uL (ref 0.00–0.07)
Basophils Absolute: 0 K/uL (ref 0.0–0.1)
Basophils Relative: 0 %
Eosinophils Absolute: 0.3 K/uL (ref 0.0–0.5)
Eosinophils Relative: 4 %
HCT: 37.1 % (ref 36.0–46.0)
Hemoglobin: 12.6 g/dL (ref 12.0–15.0)
Immature Granulocytes: 0 %
Lymphocytes Relative: 26 %
Lymphs Abs: 1.6 K/uL (ref 0.7–4.0)
MCH: 29.2 pg (ref 26.0–34.0)
MCHC: 34 g/dL (ref 30.0–36.0)
MCV: 85.9 fL (ref 80.0–100.0)
Monocytes Absolute: 0.5 K/uL (ref 0.1–1.0)
Monocytes Relative: 9 %
Neutro Abs: 3.6 K/uL (ref 1.7–7.7)
Neutrophils Relative %: 61 %
Platelets: 296 K/uL (ref 150–400)
RBC: 4.32 MIL/uL (ref 3.87–5.11)
RDW: 12.4 % (ref 11.5–15.5)
Smear Review: NORMAL
WBC: 6 K/uL (ref 4.0–10.5)
nRBC: 0 % (ref 0.0–0.2)

## 2024-07-31 LAB — POCT URINE DIPSTICK
Bilirubin, UA: NEGATIVE
Glucose, UA: NEGATIVE mg/dL
Ketones, POC UA: NEGATIVE mg/dL
Nitrite, UA: NEGATIVE
Protein Ur, POC: NEGATIVE mg/dL
Spec Grav, UA: 1.025
Urobilinogen, UA: 0.2 U/dL
pH, UA: 5.5

## 2024-07-31 LAB — COMPREHENSIVE METABOLIC PANEL WITH GFR
ALT: 15 U/L (ref 0–44)
AST: 18 U/L (ref 15–41)
Albumin: 4 g/dL (ref 3.5–5.0)
Alkaline Phosphatase: 74 U/L (ref 38–126)
Anion gap: 10 (ref 5–15)
BUN: 18 mg/dL (ref 8–23)
CO2: 24 mmol/L (ref 22–32)
Calcium: 9.2 mg/dL (ref 8.9–10.3)
Chloride: 105 mmol/L (ref 98–111)
Creatinine, Ser: 1.01 mg/dL — ABNORMAL HIGH (ref 0.44–1.00)
GFR, Estimated: >60 mL/min
Glucose, Bld: 91 mg/dL (ref 70–99)
Potassium: 3.8 mmol/L (ref 3.5–5.1)
Sodium: 139 mmol/L (ref 135–145)
Total Bilirubin: 0.5 mg/dL (ref 0.0–1.2)
Total Protein: 6.5 g/dL (ref 6.5–8.1)

## 2024-07-31 LAB — GLUCOSE, POCT (MANUAL RESULT ENTRY): POCT Glucose (KUC): 88 mg/dL (ref 70–99)

## 2024-07-31 MED ORDER — CEFDINIR 300 MG PO CAPS: 300.0000 mg | ORAL_CAPSULE | Freq: Two times a day (BID) | ORAL | 0 refills | Status: AC

## 2024-07-31 MED ORDER — ONDANSETRON 4 MG PO TBDP: 4.0000 mg | ORAL_TABLET | Freq: Four times a day (QID) | ORAL | 0 refills | Status: AC | PRN

## 2024-07-31 MED ORDER — ONDANSETRON 4 MG PO TBDP
4.0000 mg | ORAL_TABLET | Freq: Once | ORAL | Status: AC
  Administered 2024-07-31: 4 mg via ORAL

## 2024-07-31 MED ORDER — DICYCLOMINE HCL 20 MG PO TABS: 20.0000 mg | ORAL_TABLET | Freq: Three times a day (TID) | ORAL | 0 refills | Status: AC

## 2024-07-31 NOTE — ED Provider Notes (Signed)
 " [facility]    CSN: [id number] Arrival date & time: 07/31/24  9071      History   Chief Complaint Chief Complaint  Patient presents with   Abdominal Pain   Emesis   Diarrhea    HPI Cortne Hale is a 64 y.o. female with history of anxiety/depression, chronic low back pain, type 2 diabetes, hyperlipidemia, IBS and GERD.  Patient presents today for approximately 6-day history of abdominal pain, nausea/vomiting and diarrhea.  She says symptoms started after she ate tacos from [employer].  She had nausea and vomiting for about 2 days and then symptoms became more prevalent with diarrhea.  She has had persistent diarrhea and nausea.  She last had vomiting a couple days ago.  She also reports breaking out in hives 2 days ago.  She took Benadryl, threw up and the hives went away.  She has not had them since.  She has not been drinking much fluids or eating much.  No associated fevers.  She denies dysuria, frequency, urgency, blood in stool or black stool.  No recent travel outside the country or antibiotic use.  Denies any known exposure to stomach virus.  Has been taking omeprazole  but no other OTC meds.  Surgical history significant for back surgery, abdominal hysterectomy and toe amputation.  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   Other dysphagia 06/20/2023   Encounter for screening colonoscopy 06/20/2023   S/P amputation of lesser toe, right 12/27/2022   Diabetic foot infection (HCC) 10/14/2022   Infection of lower extremity associated with hardware 10/14/2022   Osteoarthritis of spine with radiculopathy, cervical region 09/23/2017   Spondylosis of lumbar region without myelopathy or  radiculopathy 09/23/2017   Chronic left-sided low back pain with left-sided sciatica 09/23/2017   Pulmonary nodules 08/26/2017   OSA on CPAP 02/21/2017   Hyperlipidemia associated with type 2 diabetes mellitus (HCC) 02/21/2017   Vitamin D  deficiency 12/10/2015   Type 2 diabetes mellitus with other specified complication (HCC) 09/15/2015   GERD (gastroesophageal reflux disease) 12/20/2014   Hot flash, menopausal 12/20/2014   Overweight (BMI 25.0-29.9) 12/20/2014   H/O renal calculi 05/08/2011    Past Surgical History:  Procedure Laterality Date   ABDOMINAL HYSTERECTOMY     AMPUTATION TOE Right 10/15/2022   Procedure: AMPUTATION TOE, RIGHT THIRD TOE;  Surgeon: Silva Juliene SAUNDERS, DPM;  Location: [facility];  Service: Podiatry;  Laterality: Right;   BACK SURGERY  1992   Lumbar spine, herniated disc   COLONOSCOPY WITH PROPOFOL  N/A 06/20/2023   Procedure: COLONOSCOPY WITH PROPOFOL ;  Surgeon: Therisa Bi, MD;  Location: [facility];  Service: Gastroenterology;  Laterality: N/A;   ESOPHAGOGASTRODUODENOSCOPY (EGD) WITH PROPOFOL  N/A 06/20/2023   Procedure: ESOPHAGOGASTRODUODENOSCOPY (EGD) WITH PROPOFOL ;  Surgeon: Therisa Bi, MD;  Location: [facility] ENDOSCOPY;  Service: Gastroenterology;  Laterality: N/A;    OB History     Gravida  1   Para      Term      Preterm      AB      Living         SAB      IAB  Ectopic      Multiple      Live Births               Home Medications    Prior to Admission medications  Medication Sig Start Date End Date Taking? Authorizing Provider  buPROPion  (WELLBUTRIN  XL) 300 MG 24 hr tablet Take 1 tablet (300 mg total) by mouth daily. 01/16/24  Yes Karamalegos, Marsa PARAS, DO  cefdinir  (OMNICEF ) 300 MG capsule Take 1 capsule (300 mg total) by mouth 2 (two) times daily for 7 days. 07/31/24 08/07/24 Yes Arvis Huxley B, PA-C  dicyclomine  (BENTYL ) 20 MG tablet Take 1 tablet (20 mg total) by mouth 4 (four) times daily -  before meals and at  bedtime. 07/31/24  Yes Arvis Huxley NOVAK, PA-C  DUPIXENT 300 MG/2ML prefilled syringe  03/04/24  Yes [provider]  hydrOXYzine  (ATARAX ) 10 MG tablet Take 10 mg by mouth at bedtime. 03/29/24  Yes [provider]  NEMLUVIO 30 MG SQ injection Inject into the skin. 06/05/24  Yes [provider]  ondansetron  (ZOFRAN -ODT) 4 MG disintegrating tablet Take 1 tablet (4 mg total) by mouth every 6 (six) hours as needed for nausea or vomiting. 07/31/24  Yes Arvis Huxley B, PA-C  OZEMPIC , 2 MG/DOSE, 8 MG/3ML SOPN INJECT 2MG  SUBCUTANEOUSLY ONCE A WEEK 06/28/24  Yes Karamalegos, Marsa PARAS, DO  PARoxetine  (PAXIL ) 20 MG tablet Take 1 tablet (20 mg total) by mouth daily. 01/16/24  Yes Karamalegos, Marsa PARAS, DO  rOPINIRole  (REQUIP ) 1 MG tablet Take 1-2 tablets (1-2 mg total) by mouth at bedtime. 01/16/24  Yes Karamalegos, Marsa PARAS, DO  albuterol  (VENTOLIN  HFA) 108 (90 Base) MCG/ACT inhaler Inhale into the lungs every 6 (six) hours as needed for wheezing or shortness of breath.    [provider]  BLACK COHOSH EXTRACT PO Take 1 capsule by mouth daily.    [provider]  clobetasol ointment (TEMOVATE) 0.05 % Apply 1 Application topically 2 (two) times daily. 10/19/23   [provider]  cyclobenzaprine  (FLEXERIL ) 10 MG tablet Take 0.5-1 tablets (5-10 mg total) by mouth 3 (three) times daily as needed for muscle spasms. Recommend start at night only 11/28/17   Edman Marsa PARAS, DO  Dietary Management Product (VASCULERA PO) Take by mouth.    [provider]  fluticasone  (FLONASE ) 50 MCG/ACT nasal spray Place 2 sprays into both nostrils daily. Use for 4-6 weeks then stop and use seasonally or as needed. 07/27/21   Karamalegos, Marsa PARAS, DO  furosemide  (LASIX ) 40 MG tablet Take by mouth. 06/29/13   [provider]  hydrOXYzine  (ATARAX ) 25 MG tablet Take 25 mg by mouth daily. 10/19/23   [provider]  ipratropium (ATROVENT ) 0.06 % nasal  spray Place 2 sprays into both nostrils 4 (four) times daily. For up to 5-7 days then stop. 06/28/24   Karamalegos, Marsa PARAS, DO  mometasone (ELOCON) 0.1 % ointment Apply topically. 10/19/23   [provider]  Multiple Vitamins-Minerals (HAIR/SKIN/NAILS) CAPS Take 1 capsule by mouth daily.    [provider]  omeprazole  (PRILOSEC) 20 MG capsule Take 1 capsule (20 mg total) by mouth 2 (two) times daily before a meal. 01/16/24 01/10/25  Karamalegos, Marsa PARAS, DO  polyethylene glycol powder (GLYCOLAX /MIRALAX ) 17 GM/SCOOP powder Mix 1 capful in a Drink Once daily for Constipation. 07/18/23   [place], PA-C    Family History Family History  Problem Relation Age of Onset   Heart disease Mother    Hypertension Mother  Hyperlipidemia Mother    Heart disease Father    Glaucoma Father    Breast cancer Neg Hx     Social History Social History[1]   Allergies   Other, Shellfish allergy, and Sulfa antibiotics   Review of Systems Review of Systems  Constitutional:  Positive for appetite change and fatigue. Negative for chills, diaphoresis and fever.  HENT:  Negative for congestion, rhinorrhea and sore throat.   Respiratory:  Negative for cough and shortness of breath.   Cardiovascular:  Negative for chest pain.  Gastrointestinal:  Positive for abdominal pain, diarrhea, nausea and vomiting. Negative for blood in stool.  Genitourinary:  Negative for dysuria, flank pain and frequency.  Musculoskeletal:  Negative for myalgias.  Skin:  Positive for rash.  Neurological:  Negative for weakness and headaches.     Physical Exam Triage Vital Signs ED Triage Vitals  Encounter Vitals Group     BP 07/31/24 1046 125/82     Girls Systolic BP Percentile --      Girls Diastolic BP Percentile --      Boys Systolic BP Percentile --      Boys Diastolic BP Percentile --      Pulse Rate 07/31/24 1046 86     Resp 07/31/24 1046 16     Temp 07/31/24 1046 98.8 F (37.1 C)      Temp Source 07/31/24 1046 Oral     SpO2 07/31/24 1046 98 %     Weight 07/31/24 1043 190 lb (86.2 kg)     Height --      Head Circumference --      Peak Flow --      Pain Score 07/31/24 1043 6     Pain Loc --      Pain Education --      Exclude from Growth Chart --    No data found.  Updated Vital Signs BP 125/82 (BP Location: Right Arm)   Pulse 86   Temp 98.8 F (37.1 C) (Oral)   Resp 16   Wt 190 lb (86.2 kg)   SpO2 98%   BMI 28.06 kg/m      Physical Exam Vitals and nursing note reviewed.  Constitutional:      General: She is not in acute distress.    Appearance: Normal appearance. She is well-developed. She is ill-appearing (appears fatigued). She is not toxic-appearing.  HENT:     Head: Normocephalic and atraumatic.     Nose: Nose normal.     Mouth/Throat:     Mouth: Mucous membranes are moist.     Pharynx: Oropharynx is clear.  Eyes:     General: No scleral icterus.       Right eye: No discharge.        Left eye: No discharge.     Conjunctiva/sclera: Conjunctivae normal.  Cardiovascular:     Rate and Rhythm: Normal rate and regular rhythm.     Heart sounds: Normal heart sounds.  Pulmonary:     Effort: Pulmonary effort is normal. No respiratory distress.     Breath sounds: Normal breath sounds.  Abdominal:     General: Bowel sounds are normal.     Tenderness: There is abdominal tenderness (generalized).  Musculoskeletal:     Cervical back: Neck supple.  Skin:    General: Skin is dry.  Neurological:     General: No focal deficit present.     Mental Status: She is alert. Mental status is at baseline.  Motor: No weakness.     Gait: Gait normal.  Psychiatric:        Mood and Affect: Mood normal.        Behavior: Behavior normal.      UC Treatments / Results  Labs (all labs ordered are listed, but only abnormal results are displayed) Labs Reviewed  POCT URINE DIPSTICK - Abnormal; Notable for the following components:      Result Value   Blood,  UA trace-intact (*)    Leukocytes, UA Small (1+) (*)    All other components within normal limits  GLUCOSE, POCT (MANUAL RESULT ENTRY) - Normal  URINE CULTURE  COMPREHENSIVE METABOLIC PANEL WITH GFR  CBC WITH DIFFERENTIAL/PLATELET    EKG   Radiology No results found.  Procedures Procedures (including critical care time)  Medications Ordered in UC Medications - No data to display  Initial Impression / Assessment and Plan / UC Course  I have reviewed the triage vital signs and the nursing notes.  Pertinent labs & imaging results that were available during my care of the patient were reviewed by me and considered in my medical decision making (see chart for details).   64 year old female with history of type 2 diabetes, IBS, sleep apnea and GERD presents for 6-day history of abdominal pain, nausea/vomiting and diarrhea.  Vitals are all stable and normal.  Appears fatigued.  Nontoxic.  On exam abdomen is soft with generalized tenderness to palpation.  Chest clear.  Heart regular rate and rhythm.  Urinalysis and fingerstick glucose obtained.  Fingerstick glucose 88.  Urinalysis shows trace RBCs and small leuks.  Will send for culture.  Possible UTI likely secondary to diarrhea.  Will treat with cefdinir  and amend treatment based on culture if needed.  Also suspect gastroenteritis.  Obtained CBC and CMP.  Patient will be contacted with any acutely abnormal and concerning results.  We discussed possibility of acute kidney injury and if she has significantly reduced renal function she may be advised to go to the ER.  Discussed the importance of increasing her fluid intake and rest.  Sent Zofran  and dicyclomine  to pharmacy.  Advised if abdominal pain is worsening, becoming localized, she develops fever or feels weaker or dehydrated she should go immediately to the ER.  She is agreeable.  Acute illness with systemic symptoms.  Final Clinical Impressions(s) / UC Diagnoses   Final  diagnoses:  Nausea and vomiting, unspecified vomiting type  Abdominal cramping  Diarrhea, unspecified type  Urinary tract infection without hematuria, site unspecified     Discharge Instructions      ABDOMINAL PAIN: You may take Tylenol  for pain relief. Use medications as directed including antiemetics and antidiarrheal medications if suggested or prescribed. You should increase fluids and electrolytes as well as rest over these next several days. If you have any questions or concerns, or if your symptoms are not improving or if especially if they acutely worsen, please call or stop back to the clinic immediately and we will be happy to help you or go to the ER   ABDOMINAL PAIN RED FLAGS: Seek immediate further care if: symptoms remain the same or worsen over the next 3-7 days, you are unable to keep fluids down, you see blood or mucus in your stool, you vomit black or dark red material, you have a fever of 101.F or higher, you have localized and/or persistent abdominal pain    UTI: Based on either symptoms or urinalysis, you may have a urinary tract infection.  We will send the urine for culture and call with results in a few days. Begin antibiotics at this time. Your symptoms should be much improved over the next 2-3 days. Increase rest and fluid intake. If for some reason symptoms are worsening or not improving after a couple of days or the urine culture determines the antibiotics you are taking will not treat the infection, the antibiotics may be changed. Return or go to ER for fever, back pain, worsening urinary pain, discharge, increased blood in urine. May take Tylenol  or Motrin OTC for pain relief or consider AZO if no contraindications      ED Prescriptions     Medication Sig Dispense Auth. Provider   ondansetron  (ZOFRAN -ODT) 4 MG disintegrating tablet Take 1 tablet (4 mg total) by mouth every 6 (six) hours as needed for nausea or vomiting. 15 tablet Arvis Jolan NOVAK, PA-C    dicyclomine  (BENTYL ) 20 MG tablet Take 1 tablet (20 mg total) by mouth 4 (four) times daily -  before meals and at bedtime. 20 tablet Arvis Jolan B, PA-C   cefdinir  (OMNICEF ) 300 MG capsule Take 1 capsule (300 mg total) by mouth 2 (two) times daily for 7 days. 14 capsule Arvis Jolan NOVAK, PA-C      PDMP not reviewed this encounter.     [1]  Social History Tobacco Use   Smoking status: Never   Smokeless tobacco: Never  Vaping Use   Vaping status: Never Used  Substance Use Topics   Alcohol use: No    Alcohol/week: 0.0 standard drinks of alcohol   Drug use: No     Arvis Jolan NOVAK, PA-C 07/31/24 1203  "

## 2024-07-31 NOTE — Discharge Instructions (Addendum)
ABDOMINAL PAIN: You may take Tylenol for pain relief. Use medications as directed including antiemetics and antidiarrheal medications if suggested or prescribed. You should increase fluids and electrolytes as well as rest over these next several days. If you have any questions or concerns, or if your symptoms are not improving or if especially if they acutely worsen, please call or stop back to the clinic immediately and we will be happy to help you or go to the ER   ABDOMINAL PAIN RED FLAGS: Seek immediate further care if: symptoms remain the same or worsen over the next 3-7 days, you are unable to keep fluids down, you see blood or mucus in your stool, you vomit black or dark red material, you have a fever of 101.F or higher, you have localized and/or persistent abdominal pain    UTI: Based on either symptoms or urinalysis, you may have a urinary tract infection. We will send the urine for culture and call with results in a few days. Begin antibiotics at this time. Your symptoms should be much improved over the next 2-3 days. Increase rest and fluid intake. If for some reason symptoms are worsening or not improving after a couple of days or the urine culture determines the antibiotics you are taking will not treat the infection, the antibiotics may be changed. Return or go to ER for fever, back pain, worsening urinary pain, discharge, increased blood in urine. May take Tylenol or Motrin OTC for pain relief or consider AZO if no contraindications

## 2024-07-31 NOTE — ED Triage Notes (Addendum)
 Patient states that she's had abdominal pain, diarrhea and vomiting for 6 days. Patient states that she vomited last Sat night, but diarrhea and abdominal pain havent gotten any better. Patient states that she developed a rash sat night and got better after she threw up the same night. Patient states that she had drank a liquid IV.

## 2024-08-01 LAB — URINE CULTURE

## 2024-09-11 ENCOUNTER — Encounter

## 2024-09-11 ENCOUNTER — Other Ambulatory Visit

## 2024-12-24 ENCOUNTER — Other Ambulatory Visit

## 2024-12-31 ENCOUNTER — Encounter: Admitting: Family Medicine
